# Patient Record
Sex: Male | Born: 1960
Health system: Southern US, Community
[De-identification: ages and names within clinical notes are randomized; demographics above are authoritative.]

## PROBLEM LIST (undated history)

## (undated) DIAGNOSIS — D649 Anemia, unspecified: Secondary | ICD-10-CM

## (undated) DIAGNOSIS — N189 Chronic kidney disease, unspecified: Secondary | ICD-10-CM

## (undated) DIAGNOSIS — I10 Essential (primary) hypertension: Secondary | ICD-10-CM

## (undated) DIAGNOSIS — G4730 Sleep apnea, unspecified: Secondary | ICD-10-CM

## (undated) DIAGNOSIS — M069 Rheumatoid arthritis, unspecified: Secondary | ICD-10-CM

## (undated) DIAGNOSIS — I509 Heart failure, unspecified: Secondary | ICD-10-CM

## (undated) DIAGNOSIS — I5022 Chronic systolic (congestive) heart failure: Secondary | ICD-10-CM

## (undated) DIAGNOSIS — M1990 Unspecified osteoarthritis, unspecified site: Secondary | ICD-10-CM

## (undated) HISTORY — PX: CARDIAC CATHETERIZATION: SHX172

## (undated) HISTORY — PX: KNEE SURGERY: SHX244

## (undated) HISTORY — DX: Chronic systolic (congestive) heart failure: I50.22

---

## 2015-03-12 DIAGNOSIS — I420 Dilated cardiomyopathy: Secondary | ICD-10-CM | POA: Insufficient documentation

## 2015-03-12 DIAGNOSIS — N189 Chronic kidney disease, unspecified: Secondary | ICD-10-CM | POA: Insufficient documentation

## 2015-03-13 DIAGNOSIS — I10 Essential (primary) hypertension: Secondary | ICD-10-CM | POA: Diagnosis not present

## 2015-03-13 DIAGNOSIS — I429 Cardiomyopathy, unspecified: Secondary | ICD-10-CM | POA: Diagnosis not present

## 2015-03-13 DIAGNOSIS — N189 Chronic kidney disease, unspecified: Secondary | ICD-10-CM | POA: Diagnosis not present

## 2015-04-24 DIAGNOSIS — I501 Left ventricular failure: Secondary | ICD-10-CM | POA: Diagnosis not present

## 2015-04-24 DIAGNOSIS — I361 Nonrheumatic tricuspid (valve) insufficiency: Secondary | ICD-10-CM | POA: Diagnosis not present

## 2015-04-24 DIAGNOSIS — I272 Other secondary pulmonary hypertension: Secondary | ICD-10-CM | POA: Diagnosis not present

## 2015-07-16 DIAGNOSIS — Z2821 Immunization not carried out because of patient refusal: Secondary | ICD-10-CM | POA: Insufficient documentation

## 2015-07-16 DIAGNOSIS — Z789 Other specified health status: Secondary | ICD-10-CM | POA: Diagnosis not present

## 2015-07-16 DIAGNOSIS — M67432 Ganglion, left wrist: Secondary | ICD-10-CM | POA: Diagnosis not present

## 2015-07-16 DIAGNOSIS — M130 Polyarthritis, unspecified: Secondary | ICD-10-CM | POA: Diagnosis not present

## 2015-07-16 DIAGNOSIS — M67439 Ganglion, unspecified wrist: Secondary | ICD-10-CM | POA: Insufficient documentation

## 2015-08-10 ENCOUNTER — Emergency Department (HOSPITAL_COMMUNITY): Payer: Medicare Other

## 2015-08-10 ENCOUNTER — Encounter (HOSPITAL_COMMUNITY): Payer: Self-pay | Admitting: *Deleted

## 2015-08-10 ENCOUNTER — Inpatient Hospital Stay (HOSPITAL_COMMUNITY)
Admission: EM | Admit: 2015-08-10 | Discharge: 2015-08-20 | DRG: 682 | Disposition: A | Discharge status: Skilled Nursing Facility | Payer: Medicare Other | Attending: Internal Medicine | Admitting: Internal Medicine

## 2015-08-10 DIAGNOSIS — J9621 Acute and chronic respiratory failure with hypoxia: Secondary | ICD-10-CM | POA: Diagnosis not present

## 2015-08-10 DIAGNOSIS — N185 Chronic kidney disease, stage 5: Secondary | ICD-10-CM | POA: Diagnosis present

## 2015-08-10 DIAGNOSIS — M1990 Unspecified osteoarthritis, unspecified site: Secondary | ICD-10-CM | POA: Diagnosis present

## 2015-08-10 DIAGNOSIS — R5381 Other malaise: Secondary | ICD-10-CM | POA: Diagnosis present

## 2015-08-10 DIAGNOSIS — J9602 Acute respiratory failure with hypercapnia: Secondary | ICD-10-CM

## 2015-08-10 DIAGNOSIS — D509 Iron deficiency anemia, unspecified: Secondary | ICD-10-CM | POA: Diagnosis present

## 2015-08-10 DIAGNOSIS — Z79899 Other long term (current) drug therapy: Secondary | ICD-10-CM

## 2015-08-10 DIAGNOSIS — I5043 Acute on chronic combined systolic (congestive) and diastolic (congestive) heart failure: Secondary | ICD-10-CM | POA: Diagnosis present

## 2015-08-10 DIAGNOSIS — J9601 Acute respiratory failure with hypoxia: Secondary | ICD-10-CM | POA: Diagnosis present

## 2015-08-10 DIAGNOSIS — R404 Transient alteration of awareness: Secondary | ICD-10-CM | POA: Diagnosis not present

## 2015-08-10 DIAGNOSIS — G9341 Metabolic encephalopathy: Secondary | ICD-10-CM | POA: Diagnosis not present

## 2015-08-10 DIAGNOSIS — I428 Other cardiomyopathies: Secondary | ICD-10-CM

## 2015-08-10 DIAGNOSIS — E662 Morbid (severe) obesity with alveolar hypoventilation: Secondary | ICD-10-CM | POA: Diagnosis present

## 2015-08-10 DIAGNOSIS — M545 Low back pain: Secondary | ICD-10-CM | POA: Diagnosis present

## 2015-08-10 DIAGNOSIS — J811 Chronic pulmonary edema: Secondary | ICD-10-CM

## 2015-08-10 DIAGNOSIS — I429 Cardiomyopathy, unspecified: Secondary | ICD-10-CM | POA: Diagnosis present

## 2015-08-10 DIAGNOSIS — Z87891 Personal history of nicotine dependence: Secondary | ICD-10-CM

## 2015-08-10 DIAGNOSIS — I130 Hypertensive heart and chronic kidney disease with heart failure and stage 1 through stage 4 chronic kidney disease, or unspecified chronic kidney disease: Secondary | ICD-10-CM | POA: Diagnosis not present

## 2015-08-10 DIAGNOSIS — M549 Dorsalgia, unspecified: Secondary | ICD-10-CM

## 2015-08-10 DIAGNOSIS — N2581 Secondary hyperparathyroidism of renal origin: Secondary | ICD-10-CM | POA: Diagnosis present

## 2015-08-10 DIAGNOSIS — Z452 Encounter for adjustment and management of vascular access device: Secondary | ICD-10-CM | POA: Insufficient documentation

## 2015-08-10 DIAGNOSIS — M6281 Muscle weakness (generalized): Secondary | ICD-10-CM | POA: Diagnosis not present

## 2015-08-10 DIAGNOSIS — R296 Repeated falls: Secondary | ICD-10-CM | POA: Diagnosis present

## 2015-08-10 DIAGNOSIS — R159 Full incontinence of feces: Secondary | ICD-10-CM | POA: Diagnosis present

## 2015-08-10 DIAGNOSIS — M25461 Effusion, right knee: Secondary | ICD-10-CM | POA: Diagnosis present

## 2015-08-10 DIAGNOSIS — R402362 Coma scale, best motor response, obeys commands, at arrival to emergency department: Secondary | ICD-10-CM | POA: Diagnosis present

## 2015-08-10 DIAGNOSIS — M25561 Pain in right knee: Secondary | ICD-10-CM | POA: Diagnosis not present

## 2015-08-10 DIAGNOSIS — J9622 Acute and chronic respiratory failure with hypercapnia: Secondary | ICD-10-CM | POA: Diagnosis present

## 2015-08-10 DIAGNOSIS — M069 Rheumatoid arthritis, unspecified: Secondary | ICD-10-CM | POA: Diagnosis present

## 2015-08-10 DIAGNOSIS — J189 Pneumonia, unspecified organism: Secondary | ICD-10-CM | POA: Diagnosis present

## 2015-08-10 DIAGNOSIS — N189 Chronic kidney disease, unspecified: Secondary | ICD-10-CM

## 2015-08-10 DIAGNOSIS — M546 Pain in thoracic spine: Secondary | ICD-10-CM | POA: Diagnosis not present

## 2015-08-10 DIAGNOSIS — N179 Acute kidney failure, unspecified: Principal | ICD-10-CM | POA: Diagnosis present

## 2015-08-10 DIAGNOSIS — W19XXXA Unspecified fall, initial encounter: Secondary | ICD-10-CM | POA: Insufficient documentation

## 2015-08-10 DIAGNOSIS — Z9119 Patient's noncompliance with other medical treatment and regimen: Secondary | ICD-10-CM

## 2015-08-10 DIAGNOSIS — H1131 Conjunctival hemorrhage, right eye: Secondary | ICD-10-CM | POA: Diagnosis present

## 2015-08-10 DIAGNOSIS — M25551 Pain in right hip: Secondary | ICD-10-CM | POA: Diagnosis present

## 2015-08-10 DIAGNOSIS — R402252 Coma scale, best verbal response, oriented, at arrival to emergency department: Secondary | ICD-10-CM | POA: Diagnosis present

## 2015-08-10 DIAGNOSIS — E872 Acidosis: Secondary | ICD-10-CM | POA: Diagnosis not present

## 2015-08-10 DIAGNOSIS — I10 Essential (primary) hypertension: Secondary | ICD-10-CM | POA: Diagnosis present

## 2015-08-10 DIAGNOSIS — N39498 Other specified urinary incontinence: Secondary | ICD-10-CM | POA: Diagnosis present

## 2015-08-10 DIAGNOSIS — I83028 Varicose veins of left lower extremity with ulcer other part of lower leg: Secondary | ICD-10-CM | POA: Diagnosis present

## 2015-08-10 DIAGNOSIS — S0011XA Contusion of right eyelid and periocular area, initial encounter: Secondary | ICD-10-CM | POA: Diagnosis present

## 2015-08-10 DIAGNOSIS — R402142 Coma scale, eyes open, spontaneous, at arrival to emergency department: Secondary | ICD-10-CM | POA: Diagnosis present

## 2015-08-10 DIAGNOSIS — I5033 Acute on chronic diastolic (congestive) heart failure: Secondary | ICD-10-CM

## 2015-08-10 DIAGNOSIS — Z6841 Body Mass Index (BMI) 40.0 and over, adult: Secondary | ICD-10-CM

## 2015-08-10 DIAGNOSIS — M179 Osteoarthritis of knee, unspecified: Secondary | ICD-10-CM | POA: Diagnosis present

## 2015-08-10 DIAGNOSIS — E875 Hyperkalemia: Secondary | ICD-10-CM | POA: Diagnosis not present

## 2015-08-10 DIAGNOSIS — L97829 Non-pressure chronic ulcer of other part of left lower leg with unspecified severity: Secondary | ICD-10-CM | POA: Diagnosis present

## 2015-08-10 DIAGNOSIS — W06XXXA Fall from bed, initial encounter: Secondary | ICD-10-CM | POA: Diagnosis present

## 2015-08-10 DIAGNOSIS — L97909 Non-pressure chronic ulcer of unspecified part of unspecified lower leg with unspecified severity: Secondary | ICD-10-CM

## 2015-08-10 DIAGNOSIS — I5032 Chronic diastolic (congestive) heart failure: Secondary | ICD-10-CM | POA: Insufficient documentation

## 2015-08-10 DIAGNOSIS — I83009 Varicose veins of unspecified lower extremity with ulcer of unspecified site: Secondary | ICD-10-CM | POA: Diagnosis present

## 2015-08-10 DIAGNOSIS — Z7401 Bed confinement status: Secondary | ICD-10-CM

## 2015-08-10 DIAGNOSIS — J9600 Acute respiratory failure, unspecified whether with hypoxia or hypercapnia: Secondary | ICD-10-CM | POA: Insufficient documentation

## 2015-08-10 DIAGNOSIS — S3992XA Unspecified injury of lower back, initial encounter: Secondary | ICD-10-CM | POA: Diagnosis not present

## 2015-08-10 DIAGNOSIS — R0902 Hypoxemia: Secondary | ICD-10-CM

## 2015-08-10 DIAGNOSIS — R627 Adult failure to thrive: Secondary | ICD-10-CM | POA: Diagnosis present

## 2015-08-10 DIAGNOSIS — Y92009 Unspecified place in unspecified non-institutional (private) residence as the place of occurrence of the external cause: Secondary | ICD-10-CM

## 2015-08-10 DIAGNOSIS — R531 Weakness: Secondary | ICD-10-CM | POA: Diagnosis not present

## 2015-08-10 DIAGNOSIS — R0600 Dyspnea, unspecified: Secondary | ICD-10-CM

## 2015-08-10 DIAGNOSIS — T380X5A Adverse effect of glucocorticoids and synthetic analogues, initial encounter: Secondary | ICD-10-CM | POA: Diagnosis present

## 2015-08-10 HISTORY — DX: Unspecified osteoarthritis, unspecified site: M19.90

## 2015-08-10 HISTORY — DX: Heart failure, unspecified: I50.9

## 2015-08-10 HISTORY — DX: Essential (primary) hypertension: I10

## 2015-08-10 LAB — COMPREHENSIVE METABOLIC PANEL
ALBUMIN: 2.7 g/dL — AB (ref 3.5–5.0)
ALT: 28 U/L (ref 17–63)
ANION GAP: 11 (ref 5–15)
AST: 49 U/L — ABNORMAL HIGH (ref 15–41)
Alkaline Phosphatase: 67 U/L (ref 38–126)
BUN: 80 mg/dL — ABNORMAL HIGH (ref 6–20)
CHLORIDE: 102 mmol/L (ref 101–111)
CO2: 25 mmol/L (ref 22–32)
Calcium: 9 mg/dL (ref 8.9–10.3)
Creatinine, Ser: 4.62 mg/dL — ABNORMAL HIGH (ref 0.61–1.24)
GFR calc non Af Amer: 13 mL/min — ABNORMAL LOW (ref >60)
GFR, EST AFRICAN AMERICAN: 15 mL/min — AB (ref >60)
Glucose, Bld: 78 mg/dL (ref 65–99)
POTASSIUM: 4.8 mmol/L (ref 3.5–5.1)
SODIUM: 138 mmol/L (ref 135–145)
Total Bilirubin: 0.8 mg/dL (ref 0.3–1.2)
Total Protein: 7.4 g/dL (ref 6.5–8.1)

## 2015-08-10 LAB — URINALYSIS, ROUTINE W REFLEX MICROSCOPIC
Bilirubin Urine: NEGATIVE
Glucose, UA: NEGATIVE mg/dL
KETONES UR: NEGATIVE mg/dL
LEUKOCYTES UA: NEGATIVE
NITRITE: NEGATIVE
PH: 5 (ref 5.0–8.0)
Protein, ur: 30 mg/dL — AB
SPECIFIC GRAVITY, URINE: 1.014 (ref 1.005–1.030)

## 2015-08-10 LAB — CBC WITH DIFFERENTIAL/PLATELET
BASOS PCT: 0 %
Basophils Absolute: 0 10*3/uL (ref 0.0–0.1)
EOS ABS: 0.2 10*3/uL (ref 0.0–0.7)
Eosinophils Relative: 2 %
HEMATOCRIT: 26.3 % — AB (ref 39.0–52.0)
HEMOGLOBIN: 8.6 g/dL — AB (ref 13.0–17.0)
LYMPHS ABS: 1.6 10*3/uL (ref 0.7–4.0)
Lymphocytes Relative: 13 %
MCH: 26.1 pg (ref 26.0–34.0)
MCHC: 32.7 g/dL (ref 30.0–36.0)
MCV: 79.9 fL (ref 78.0–100.0)
MONOS PCT: 9 %
Monocytes Absolute: 1.1 10*3/uL — ABNORMAL HIGH (ref 0.1–1.0)
NEUTROS ABS: 9.4 10*3/uL — AB (ref 1.7–7.7)
NEUTROS PCT: 76 %
PLATELETS: 267 10*3/uL (ref 150–400)
RBC: 3.29 MIL/uL — AB (ref 4.22–5.81)
RDW: 16.6 % — ABNORMAL HIGH (ref 11.5–15.5)
WBC: 12.3 10*3/uL — AB (ref 4.0–10.5)

## 2015-08-10 LAB — URINE MICROSCOPIC-ADD ON

## 2015-08-10 MED ORDER — HYDROMORPHONE HCL 1 MG/ML IJ SOLN
1.0000 mg | INTRAMUSCULAR | Status: DC | PRN
  Administered 2015-08-10: 1 mg via INTRAVENOUS
  Filled 2015-08-10 (×2): qty 1

## 2015-08-10 MED ORDER — MORPHINE SULFATE (PF) 4 MG/ML IV SOLN
6.0000 mg | Freq: Once | INTRAVENOUS | Status: AC
  Administered 2015-08-10: 6 mg via INTRAVENOUS
  Filled 2015-08-10: qty 2

## 2015-08-10 NOTE — ED Notes (Signed)
Patient returned from MRI.

## 2015-08-10 NOTE — ED Provider Notes (Signed)
CSN: [id number]     Arrival date & time 08/10/15  1607 History   First MD Initiated Contact with Patient 08/10/15 1734     Chief Complaint  Patient presents with  . Fall     (Consider location/radiation/quality/duration/timing/severity/associated sxs/prior Treatment) HPI  Patient is a 54 year old male, with a past medical history of CHF, hypertension, arthritis who presents to the emergency department following recurrent falls. Patient recently diagnosed with rheumatoid arthritis, however patient has not been evaluated by rheumatology and has not initiated any treatment. Patient reports that he has had progressive worsening of lower back pain and lower joint pain over the past 1-2 months. Patient states that he is primarily bedbound over the course of the month. Reports difficulty with ambulation secondary to diffuse joint pain of his upper and lower extremities. States it has been progressively worsening over the past week and a half. Reports some urinary and bowel incontinence due to inability to get to the bathroom on time. Nothing seems to worsen or improve the pain. Has not taken anything for the pain. Patient had 2 falls today, reports that his knees "gave out from underneath him". States he hit the right side of his face on the hard floor. Complaining of swelling to the right eye. Denies headache, change in vision, pain with extraocular movement, difficulty swallowing, chest pain, shortness of breath, fevers, chills, urinary retention. Does report worsening weakness of the right lower extremity, states he can no longer lifted out of bed. Denies known malignancy, denies night sweats, no unexpected weight loss.  Past Medical History  Diagnosis Date  . CHF (congestive heart failure) ([place])   . Hypertension   . Arthritis    History reviewed. No pertinent past surgical history. History reviewed. No pertinent family history. Social History  Substance Use Topics  . Smoking status: Former  Research scientist (life sciences)  . Smokeless tobacco: None  . Alcohol Use: No    Review of Systems  Constitutional: Negative for fever and appetite change.  HENT: Negative for congestion and trouble swallowing.   Eyes: Negative for visual disturbance.  Respiratory: Negative for chest tightness, shortness of breath and wheezing.   Cardiovascular: Negative for chest pain and palpitations.  Gastrointestinal: Negative for nausea, vomiting, abdominal pain, constipation and blood in stool.  Genitourinary: Positive for frequency. Negative for dysuria, urgency, hematuria, flank pain, decreased urine volume and difficulty urinating.  Musculoskeletal: Positive for back pain, joint swelling, arthralgias and gait problem. Negative for neck pain and neck stiffness.  Skin: Positive for wound. Negative for rash.  Neurological: Negative for dizziness, seizures, weakness, light-headedness, numbness and headaches.  Psychiatric/Behavioral: Negative for behavioral problems.      Allergies  Review of patient's allergies indicates no known allergies.  Home Medications   Prior to Admission medications   Medication Sig Start Date End Date Taking? Authorizing Provider  carvedilol (COREG) 25 MG tablet Take 25 mg by mouth 2 (two) times daily with a meal.   Yes Historical Provider, MD  furosemide (LASIX) 40 MG tablet Take 60 mg by mouth 2 (two) times daily.   Yes Historical Provider, MD   BP 146/71 mmHg  Pulse 108  Temp(Src) 98.7 F (37.1 C) (Oral)  Resp 18  Ht 5\' 11"  (1.803 m)  Wt 184.478 kg  BMI 56.75 kg/m2  SpO2 94% Physical Exam  Constitutional: He is oriented to person, place, and time. He appears well-developed and well-nourished. No distress.  HENT:  Head: Normocephalic and atraumatic.  Mouth/Throat: Oropharynx is clear and moist.  Eyes:  EOM are normal. Pupils are equal, round, and reactive to light. Right conjunctiva has a hemorrhage. Left conjunctiva has no hemorrhage.  Neck: Normal range of motion. No JVD  present. No tracheal deviation present.  Cardiovascular: Normal rate, regular rhythm, normal heart sounds and intact distal pulses.   Pulmonary/Chest: Effort normal and breath sounds normal. No respiratory distress. He has no wheezes.  Abdominal: Soft. He exhibits no distension. There is no tenderness. There is no rebound and no guarding.  Genitourinary:  Refused GU exam.  Musculoskeletal: Normal range of motion.  Bilateral lower extremity edema.  5/5 strength throughout LLE. 4/5 strength of right hip, knee, ankle flexor and extensors.    Neurological: He is alert and oriented to person, place, and time. He has normal reflexes. No cranial nerve deficit or sensory deficit. Gait abnormal. GCS eye subscore is 4. GCS verbal subscore is 5. GCS motor subscore is 6. He displays no Babinski's sign on the right side. He displays no Babinski's sign on the left side.  Skin: Skin is warm.  Psychiatric: He has a normal mood and affect.  Nursing note and vitals reviewed.   ED Course  Procedures (including critical care time) Labs Review Labs Reviewed  CBC WITH DIFFERENTIAL/PLATELET - Abnormal; Notable for the following:    WBC 12.3 (*)    RBC 3.29 (*)    Hemoglobin 8.6 (*)    HCT 26.3 (*)    RDW 16.6 (*)    Neutro Abs 9.4 (*)    Monocytes Absolute 1.1 (*)    All other components within normal limits  COMPREHENSIVE METABOLIC PANEL - Abnormal; Notable for the following:    BUN 80 (*)    Creatinine, Ser 4.62 (*)    Albumin 2.7 (*)    AST 49 (*)    GFR calc non Af Amer 13 (*)    GFR calc Af Amer 15 (*)    All other components within normal limits  URINALYSIS, ROUTINE W REFLEX MICROSCOPIC (NOT AT [facility]) - Abnormal; Notable for the following:    APPearance CLOUDY (*)    Hgb urine dipstick MODERATE (*)    Protein, ur 30 (*)    All other components within normal limits  URINE MICROSCOPIC-ADD ON - Abnormal; Notable for the following:    Squamous Epithelial / LPF 0-5 (*)    Bacteria, UA RARE (*)     All other components within normal limits  BASIC METABOLIC PANEL  CBC    Imaging Review Dg Thoracic Spine 2 View  08/10/2015  CLINICAL DATA:  Back pain after a fall today. EXAM: THORACIC SPINE 2 VIEWS COMPARISON:  None. FINDINGS: Normal alignment of the thoracic spine. Diffuse degenerative changes with bridging anterior osteophytes throughout. No vertebral compression deformities. Bone cortex appears intact. No focal bone lesion or bone destruction. No paraspinal soft tissue swelling. IMPRESSION: Diffuse degenerative change throughout the thoracic spine with bridging anterior osteophytes. No acute displaced fractures identified. Electronically Signed   By: Lucienne Capers M.D.   On: 08/10/2015 19:09   Dg Lumbar Spine 2-3 Views  08/10/2015  CLINICAL DATA:  Fall, back pain EXAM: LUMBAR SPINE - 2-3 VIEW COMPARISON:  None. FINDINGS: Two views of lumbar spine submitted. No acute fracture or subluxation. Multiple level anterior endplate osteophytes. Bridging anterior osteophytes at L1-L2 and L2-L3 level. Right lateral osteophytes noted at L2-L3 level. Disc space flattening at L4-L5 and L5-S1 level. Facet degenerative changes L4 and L5 level. IMPRESSION: No acute fracture or subluxation. Multilevel degenerative changes as described  above. Electronically Signed   By: Lahoma Crocker M.D.   On: 08/10/2015 19:11   Mr Thoracic Spine Wo Contrast  08/10/2015  CLINICAL DATA:  Initial evaluation for acute low back pain with right hip pain. Incontinence. Status post fall. EXAM: MRI THORACIC SPINE WITHOUT CONTRAST TECHNIQUE: Multiplanar, multisequence MR imaging of the thoracic spine was performed. No intravenous contrast was administered. COMPARISON:  Prior radiograph from earlier the same day. FINDINGS: Vertebral bodies are normally aligned with preservation of the normal thoracic kyphosis. There is mild chronic height loss at the superior endplate of 624THL, chronic in nature. No associated retropulsion. Small Schmorl's  nodes at the superior endplates of T2 and 624THL. Minimal edema about the Schmorl's node at T12. Otherwise, vertebral body heights are well maintained. No fracture. Mild diffuse decreased it T1 signal intensity throughout the vertebral body bone marrow noted, likely within normal limits. No focal osseous lesions. No marrow edema. Signal intensity within the thoracic spinal cord is within normal limits. No paraspinous soft tissue abnormality. Diffusely flowing osteophytes seen throughout the mid and lower thoracic spine, suggesting DISH. No significant disc bulge within the thoracic spine. No significant canal or foraminal stenosis. IMPRESSION: 1. No acute abnormality within the thoracic spine. No cord compression or significant stenosis. 2. Mild chronic height loss at the superior endplate of 624THL. 3. Diffusely flowing bridging osteophytes throughout the mid and lower thoracic spine, suggesting DISH. Electronically Signed   By: Jeannine Boga M.D.   On: 08/10/2015 22:54   Mr Lumbar Spine Wo Contrast  08/10/2015  CLINICAL DATA:  Initial evaluation for acute low back pain with pain and right hip status post fall, weakness. EXAM: MRI LUMBAR SPINE WITHOUT CONTRAST TECHNIQUE: Multiplanar, multisequence MR imaging of the lumbar spine was performed. No intravenous contrast was administered. COMPARISON:  Prior radiograph from earlier the same day. FINDINGS: Study is limited by body habitus. The purposes of this dictation, the lowest well-formed intervertebral disc spaces presumed to be the L5-S1 level, and there presumed to be 5 lumbar type vertebral bodies. Vertebral bodies are normally aligned with preservation of the normal lumbar lordosis. Vertebral body heights are maintained. No fracture. Somewhat diffusely decreased T1 signal intensity within the visualized vertebral body bone marrow, but still likely within normal limits. Conus medullaris terminates normally at the T12-L1 level. Signal intensity within the  visualized cord is normal. Nerve roots of the cauda equina are normal in appearance. Paraspinous soft tissues demonstrate no acute abnormality. Diffuse congenital shortening of the pedicles present. No significant degenerative pathology present at T11-12 and T12-L1. L1-2: Degenerative disc desiccation with minimal disc bulge. Prominent osteophytic spurring along the right anterolateral aspect of the L1-2 disc space. No focal disc herniation. No significant canal stenosis. Mild edema about the right L1-2 facet likely related to degenerative changes. Very mild right foraminal. No neural impingement. L2-3:  Negative. L3-4: No disc bulge or disc protrusion. Intervertebral disc is well hydrated. Bilateral facet arthrosis, right greater than left. No significant canal or foraminal stenosis. No neural impingement. L4-5: Diffuse disc bulge with disc desiccation. There is a small shallow superimposed central disc protrusion which indents the ventral thecal sac. Mild bilateral facet arthrosis. These changes superimposed on short pedicles results in moderate canal stenosis. Mild left foraminal narrowing present. L5-S1: No disc bulge or disc protrusion. Very mild bilateral facet hypertrophy. No significant canal or foraminal stenosis. IMPRESSION: 1. No acute abnormality within the lumbar spine. 2. Shallow central disc protrusion at L4-5, which superimposed on mild facet arthrosis and  short pedicles results in moderate canal stenosis. No other significant canal narrowing within the lumbar spine. No cord compression. 3. Severe right-sided facet arthrosis at L1-2 with associated reactive edema. 4. Mild degenerative disc bulge at L1-2 without stenosis. 5. Diffuse congenital shortening of the pedicles. Electronically Signed   By: Jeannine Boga M.D.   On: 08/10/2015 22:15   I have personally reviewed and evaluated these images and lab results as part of my medical decision-making.   EKG Interpretation None      MDM    Final diagnoses:  Acute back pain  Acute back pain   Patient is a 54 year old male with past medical history significant for CHF, hypertension, recently diagnosed rheumatoid arthritis (has not seen rheumatology or initiated treatment), who presents to the emergency department with lower back pain associated with falls. On arrival no acute distress, not ill appearing. Afebrile, hemodynamically stable. Exam as above, notable for obesity, diffuse joint swelling of his lower extremities, decreased strength of the right lower extremity when compared to the left, +2 DTR, sensation intact. GCS of 15.  Given IV pain medication.  Patient only traumatic finding is swelling around his right eye with subconjunctival hemorrhage. No eye pain, no proptosis, extraocular movements intact, no change in vision. No signs of retrobulbar hematoma. No signs of entrapment. GCS 15. Normal neurologic exam. Do not feel that CT head, face, C-spine is necessary at this time. No other traumatic findings.  Given the questionable urinary in bowel incontinence with appreciable weakness of the right lower extremity when compared to the left will obtain MRI lumbar and thoracic spine to evaluate for cord compression syndromes. Will obtain basic lab work.  MRI showed no signs of cord compression. Lab work showed AKI, Cr 4.68. No history of renal disease.  No acute electrolyte abnormalities, does not require emergent dialysis.  Given that the patient is unable to ambulate in the ED with new AKI of unknown etiology, will admit the patient to medicine for ongoing management and workup. Patient stable for the floor. Transferred to the floor in stable condition.      Nathaniel Man, MD 08/11/15 [place], MD 08/11/15 [phone]

## 2015-08-10 NOTE — ED Notes (Signed)
Pt tripped while getting out of bed and fell face first on floor injuring right eye.  Pt fell a second time when trying to stand because his legs were weak and not able to hold his weight.  Pt c/o pain in his lower back and right hip.  Pt is incontinent and has extreme edema bilateral lower legs with sores.  Denies hitting head or LOC.  Pt c/o of SOB due to congestion.  Denies chest pain or abdominal pain.

## 2015-08-11 ENCOUNTER — Encounter (HOSPITAL_COMMUNITY): Payer: Self-pay | Admitting: *Deleted

## 2015-08-11 ENCOUNTER — Inpatient Hospital Stay (HOSPITAL_COMMUNITY): Payer: Medicare Other

## 2015-08-11 DIAGNOSIS — L97909 Non-pressure chronic ulcer of unspecified part of unspecified lower leg with unspecified severity: Secondary | ICD-10-CM

## 2015-08-11 DIAGNOSIS — M6281 Muscle weakness (generalized): Secondary | ICD-10-CM | POA: Diagnosis not present

## 2015-08-11 DIAGNOSIS — R0902 Hypoxemia: Secondary | ICD-10-CM | POA: Diagnosis not present

## 2015-08-11 DIAGNOSIS — J9622 Acute and chronic respiratory failure with hypercapnia: Secondary | ICD-10-CM | POA: Diagnosis present

## 2015-08-11 DIAGNOSIS — E872 Acidosis: Secondary | ICD-10-CM | POA: Diagnosis not present

## 2015-08-11 DIAGNOSIS — W06XXXA Fall from bed, initial encounter: Secondary | ICD-10-CM | POA: Diagnosis present

## 2015-08-11 DIAGNOSIS — Z7401 Bed confinement status: Secondary | ICD-10-CM | POA: Diagnosis not present

## 2015-08-11 DIAGNOSIS — M25561 Pain in right knee: Secondary | ICD-10-CM | POA: Diagnosis not present

## 2015-08-11 DIAGNOSIS — M1990 Unspecified osteoarthritis, unspecified site: Secondary | ICD-10-CM | POA: Diagnosis present

## 2015-08-11 DIAGNOSIS — E662 Morbid (severe) obesity with alveolar hypoventilation: Secondary | ICD-10-CM | POA: Diagnosis not present

## 2015-08-11 DIAGNOSIS — J9600 Acute respiratory failure, unspecified whether with hypoxia or hypercapnia: Secondary | ICD-10-CM | POA: Diagnosis not present

## 2015-08-11 DIAGNOSIS — Z79899 Other long term (current) drug therapy: Secondary | ICD-10-CM | POA: Diagnosis not present

## 2015-08-11 DIAGNOSIS — R402142 Coma scale, eyes open, spontaneous, at arrival to emergency department: Secondary | ICD-10-CM | POA: Diagnosis present

## 2015-08-11 DIAGNOSIS — J9621 Acute and chronic respiratory failure with hypoxia: Secondary | ICD-10-CM | POA: Diagnosis present

## 2015-08-11 DIAGNOSIS — I5043 Acute on chronic combined systolic (congestive) and diastolic (congestive) heart failure: Secondary | ICD-10-CM | POA: Diagnosis present

## 2015-08-11 DIAGNOSIS — I429 Cardiomyopathy, unspecified: Secondary | ICD-10-CM | POA: Diagnosis not present

## 2015-08-11 DIAGNOSIS — I509 Heart failure, unspecified: Secondary | ICD-10-CM | POA: Diagnosis not present

## 2015-08-11 DIAGNOSIS — I83019 Varicose veins of right lower extremity with ulcer of unspecified site: Secondary | ICD-10-CM | POA: Diagnosis not present

## 2015-08-11 DIAGNOSIS — E875 Hyperkalemia: Secondary | ICD-10-CM | POA: Diagnosis not present

## 2015-08-11 DIAGNOSIS — N184 Chronic kidney disease, stage 4 (severe): Secondary | ICD-10-CM | POA: Diagnosis not present

## 2015-08-11 DIAGNOSIS — Z9181 History of falling: Secondary | ICD-10-CM | POA: Diagnosis not present

## 2015-08-11 DIAGNOSIS — R627 Adult failure to thrive: Secondary | ICD-10-CM | POA: Diagnosis present

## 2015-08-11 DIAGNOSIS — R41841 Cognitive communication deficit: Secondary | ICD-10-CM | POA: Diagnosis not present

## 2015-08-11 DIAGNOSIS — M0579 Rheumatoid arthritis with rheumatoid factor of multiple sites without organ or systems involvement: Secondary | ICD-10-CM | POA: Diagnosis not present

## 2015-08-11 DIAGNOSIS — D509 Iron deficiency anemia, unspecified: Secondary | ICD-10-CM | POA: Diagnosis present

## 2015-08-11 DIAGNOSIS — S0011XA Contusion of right eyelid and periocular area, initial encounter: Secondary | ICD-10-CM | POA: Diagnosis present

## 2015-08-11 DIAGNOSIS — I5033 Acute on chronic diastolic (congestive) heart failure: Secondary | ICD-10-CM | POA: Diagnosis not present

## 2015-08-11 DIAGNOSIS — L97829 Non-pressure chronic ulcer of other part of left lower leg with unspecified severity: Secondary | ICD-10-CM | POA: Diagnosis present

## 2015-08-11 DIAGNOSIS — R402252 Coma scale, best verbal response, oriented, at arrival to emergency department: Secondary | ICD-10-CM | POA: Diagnosis present

## 2015-08-11 DIAGNOSIS — M549 Dorsalgia, unspecified: Secondary | ICD-10-CM | POA: Diagnosis not present

## 2015-08-11 DIAGNOSIS — R2681 Unsteadiness on feet: Secondary | ICD-10-CM | POA: Diagnosis not present

## 2015-08-11 DIAGNOSIS — I120 Hypertensive chronic kidney disease with stage 5 chronic kidney disease or end stage renal disease: Secondary | ICD-10-CM | POA: Diagnosis not present

## 2015-08-11 DIAGNOSIS — S0510XA Contusion of eyeball and orbital tissues, unspecified eye, initial encounter: Secondary | ICD-10-CM | POA: Insufficient documentation

## 2015-08-11 DIAGNOSIS — E8770 Fluid overload, unspecified: Secondary | ICD-10-CM | POA: Diagnosis not present

## 2015-08-11 DIAGNOSIS — R0602 Shortness of breath: Secondary | ICD-10-CM | POA: Diagnosis not present

## 2015-08-11 DIAGNOSIS — R0600 Dyspnea, unspecified: Secondary | ICD-10-CM | POA: Diagnosis not present

## 2015-08-11 DIAGNOSIS — I83009 Varicose veins of unspecified lower extremity with ulcer of unspecified site: Secondary | ICD-10-CM | POA: Diagnosis present

## 2015-08-11 DIAGNOSIS — R159 Full incontinence of feces: Secondary | ICD-10-CM | POA: Diagnosis present

## 2015-08-11 DIAGNOSIS — R29898 Other symptoms and signs involving the musculoskeletal system: Secondary | ICD-10-CM | POA: Diagnosis not present

## 2015-08-11 DIAGNOSIS — Z87891 Personal history of nicotine dependence: Secondary | ICD-10-CM | POA: Diagnosis not present

## 2015-08-11 DIAGNOSIS — I10 Essential (primary) hypertension: Secondary | ICD-10-CM | POA: Diagnosis not present

## 2015-08-11 DIAGNOSIS — R4182 Altered mental status, unspecified: Secondary | ICD-10-CM | POA: Diagnosis not present

## 2015-08-11 DIAGNOSIS — H1131 Conjunctival hemorrhage, right eye: Secondary | ICD-10-CM | POA: Diagnosis present

## 2015-08-11 DIAGNOSIS — R5381 Other malaise: Secondary | ICD-10-CM | POA: Diagnosis not present

## 2015-08-11 DIAGNOSIS — R2689 Other abnormalities of gait and mobility: Secondary | ICD-10-CM | POA: Diagnosis not present

## 2015-08-11 DIAGNOSIS — N179 Acute kidney failure, unspecified: Secondary | ICD-10-CM | POA: Diagnosis not present

## 2015-08-11 DIAGNOSIS — M79671 Pain in right foot: Secondary | ICD-10-CM | POA: Diagnosis not present

## 2015-08-11 DIAGNOSIS — J9601 Acute respiratory failure with hypoxia: Secondary | ICD-10-CM | POA: Diagnosis not present

## 2015-08-11 DIAGNOSIS — N2581 Secondary hyperparathyroidism of renal origin: Secondary | ICD-10-CM | POA: Diagnosis present

## 2015-08-11 DIAGNOSIS — R296 Repeated falls: Secondary | ICD-10-CM | POA: Diagnosis present

## 2015-08-11 DIAGNOSIS — Z452 Encounter for adjustment and management of vascular access device: Secondary | ICD-10-CM | POA: Diagnosis not present

## 2015-08-11 DIAGNOSIS — M179 Osteoarthritis of knee, unspecified: Secondary | ICD-10-CM | POA: Diagnosis present

## 2015-08-11 DIAGNOSIS — M069 Rheumatoid arthritis, unspecified: Secondary | ICD-10-CM | POA: Diagnosis present

## 2015-08-11 DIAGNOSIS — R402362 Coma scale, best motor response, obeys commands, at arrival to emergency department: Secondary | ICD-10-CM | POA: Diagnosis present

## 2015-08-11 DIAGNOSIS — N185 Chronic kidney disease, stage 5: Secondary | ICD-10-CM | POA: Diagnosis not present

## 2015-08-11 DIAGNOSIS — I83028 Varicose veins of left lower extremity with ulcer other part of lower leg: Secondary | ICD-10-CM | POA: Diagnosis present

## 2015-08-11 DIAGNOSIS — M7989 Other specified soft tissue disorders: Secondary | ICD-10-CM | POA: Diagnosis not present

## 2015-08-11 DIAGNOSIS — S0511XA Contusion of eyeball and orbital tissues, right eye, initial encounter: Secondary | ICD-10-CM

## 2015-08-11 DIAGNOSIS — G9341 Metabolic encephalopathy: Secondary | ICD-10-CM | POA: Diagnosis not present

## 2015-08-11 DIAGNOSIS — M545 Low back pain: Secondary | ICD-10-CM | POA: Diagnosis present

## 2015-08-11 DIAGNOSIS — Z9119 Patient's noncompliance with other medical treatment and regimen: Secondary | ICD-10-CM | POA: Diagnosis not present

## 2015-08-11 DIAGNOSIS — Z6841 Body Mass Index (BMI) 40.0 and over, adult: Secondary | ICD-10-CM | POA: Diagnosis not present

## 2015-08-11 DIAGNOSIS — W19XXXA Unspecified fall, initial encounter: Secondary | ICD-10-CM | POA: Insufficient documentation

## 2015-08-11 DIAGNOSIS — N39498 Other specified urinary incontinence: Secondary | ICD-10-CM | POA: Diagnosis present

## 2015-08-11 DIAGNOSIS — J189 Pneumonia, unspecified organism: Secondary | ICD-10-CM | POA: Diagnosis present

## 2015-08-11 DIAGNOSIS — M25551 Pain in right hip: Secondary | ICD-10-CM | POA: Diagnosis present

## 2015-08-11 DIAGNOSIS — M25461 Effusion, right knee: Secondary | ICD-10-CM | POA: Diagnosis not present

## 2015-08-11 DIAGNOSIS — D631 Anemia in chronic kidney disease: Secondary | ICD-10-CM | POA: Diagnosis not present

## 2015-08-11 DIAGNOSIS — R0989 Other specified symptoms and signs involving the circulatory and respiratory systems: Secondary | ICD-10-CM | POA: Diagnosis not present

## 2015-08-11 DIAGNOSIS — T380X5A Adverse effect of glucocorticoids and synthetic analogues, initial encounter: Secondary | ICD-10-CM | POA: Diagnosis present

## 2015-08-11 DIAGNOSIS — I130 Hypertensive heart and chronic kidney disease with heart failure and stage 1 through stage 4 chronic kidney disease, or unspecified chronic kidney disease: Secondary | ICD-10-CM | POA: Diagnosis present

## 2015-08-11 DIAGNOSIS — N189 Chronic kidney disease, unspecified: Secondary | ICD-10-CM | POA: Diagnosis not present

## 2015-08-11 DIAGNOSIS — J9602 Acute respiratory failure with hypercapnia: Secondary | ICD-10-CM | POA: Diagnosis not present

## 2015-08-11 DIAGNOSIS — I5023 Acute on chronic systolic (congestive) heart failure: Secondary | ICD-10-CM | POA: Diagnosis not present

## 2015-08-11 DIAGNOSIS — D638 Anemia in other chronic diseases classified elsewhere: Secondary | ICD-10-CM | POA: Diagnosis not present

## 2015-08-11 LAB — BASIC METABOLIC PANEL
ANION GAP: 9 (ref 5–15)
BUN: 76 mg/dL — AB (ref 6–20)
CALCIUM: 8.9 mg/dL (ref 8.9–10.3)
CO2: 25 mmol/L (ref 22–32)
Chloride: 105 mmol/L (ref 101–111)
Creatinine, Ser: 4.51 mg/dL — ABNORMAL HIGH (ref 0.61–1.24)
GFR calc Af Amer: 16 mL/min — ABNORMAL LOW (ref >60)
GFR, EST NON AFRICAN AMERICAN: 14 mL/min — AB (ref >60)
GLUCOSE: 99 mg/dL (ref 65–99)
POTASSIUM: 4.2 mmol/L (ref 3.5–5.1)
SODIUM: 139 mmol/L (ref 135–145)

## 2015-08-11 LAB — CBC
HCT: 23.8 % — ABNORMAL LOW (ref 39.0–52.0)
Hemoglobin: 7.7 g/dL — ABNORMAL LOW (ref 13.0–17.0)
MCH: 25.9 pg — ABNORMAL LOW (ref 26.0–34.0)
MCHC: 32.4 g/dL (ref 30.0–36.0)
MCV: 80.1 fL (ref 78.0–100.0)
Platelets: 257 10*3/uL (ref 150–400)
RBC: 2.97 MIL/uL — ABNORMAL LOW (ref 4.22–5.81)
RDW: 16.8 % — AB (ref 11.5–15.5)
WBC: 11.8 10*3/uL — AB (ref 4.0–10.5)

## 2015-08-11 MED ORDER — OXYCODONE HCL 5 MG PO TABS
5.0000 mg | ORAL_TABLET | ORAL | Status: DC | PRN
  Administered 2015-08-12: 5 mg via ORAL
  Filled 2015-08-11: qty 1

## 2015-08-11 MED ORDER — SODIUM CHLORIDE 0.9 % IJ SOLN
3.0000 mL | Freq: Two times a day (BID) | INTRAMUSCULAR | Status: DC
  Administered 2015-08-11 – 2015-08-20 (×18): 3 mL via INTRAVENOUS

## 2015-08-11 MED ORDER — SODIUM CHLORIDE 0.9 % IV SOLN
INTRAVENOUS | Status: DC
  Administered 2015-08-11: 04:00:00 via INTRAVENOUS

## 2015-08-11 MED ORDER — FUROSEMIDE 10 MG/ML IJ SOLN
80.0000 mg | Freq: Two times a day (BID) | INTRAMUSCULAR | Status: DC
  Administered 2015-08-11 – 2015-08-13 (×5): 80 mg via INTRAVENOUS
  Filled 2015-08-11 (×5): qty 8

## 2015-08-11 MED ORDER — ACETAMINOPHEN 325 MG PO TABS: 650.0000 mg | ORAL_TABLET | Freq: Four times a day (QID) | ORAL | Status: DC | PRN

## 2015-08-11 MED ORDER — ONDANSETRON HCL 4 MG/2ML IJ SOLN: 4.0000 mg | Freq: Four times a day (QID) | INTRAMUSCULAR | Status: DC | PRN

## 2015-08-11 MED ORDER — HYDROMORPHONE HCL 1 MG/ML IJ SOLN
0.5000 mg | INTRAMUSCULAR | Status: DC | PRN
  Administered 2015-08-12: 0.5 mg via INTRAVENOUS
  Filled 2015-08-11: qty 1

## 2015-08-11 MED ORDER — CARVEDILOL 25 MG PO TABS
25.0000 mg | ORAL_TABLET | Freq: Two times a day (BID) | ORAL | Status: DC
  Administered 2015-08-11 (×2): 25 mg via ORAL
  Filled 2015-08-11 (×2): qty 1

## 2015-08-11 MED ORDER — ALUM & MAG HYDROXIDE-SIMETH 200-200-20 MG/5ML PO SUSP: 30.0000 mL | Freq: Four times a day (QID) | ORAL | Status: DC | PRN

## 2015-08-11 MED ORDER — HYDROCERIN EX CREA
TOPICAL_CREAM | Freq: Every day | CUTANEOUS | Status: DC
Start: 2015-08-11 — End: 2015-08-20
  Administered 2015-08-11: 1 via TOPICAL
  Administered 2015-08-12 – 2015-08-18 (×7): via TOPICAL
  Administered 2015-08-19 – 2015-08-20 (×2): 1 via TOPICAL
  Filled 2015-08-11: qty 113

## 2015-08-11 MED ORDER — HEPARIN SODIUM (PORCINE) 5000 UNIT/ML IJ SOLN
5000.0000 [IU] | Freq: Three times a day (TID) | INTRAMUSCULAR | Status: DC
  Administered 2015-08-11 – 2015-08-20 (×21): 5000 [IU] via SUBCUTANEOUS
  Filled 2015-08-11 (×23): qty 1

## 2015-08-11 MED ORDER — ACETAMINOPHEN 650 MG RE SUPP: 650.0000 mg | Freq: Four times a day (QID) | RECTAL | Status: DC | PRN

## 2015-08-11 MED ORDER — ONDANSETRON HCL 4 MG PO TABS: 4.0000 mg | ORAL_TABLET | Freq: Four times a day (QID) | ORAL | Status: DC | PRN

## 2015-08-11 MED ORDER — CARVEDILOL 25 MG PO TABS
25.0000 mg | ORAL_TABLET | Freq: Two times a day (BID) | ORAL | Status: DC
  Administered 2015-08-12 – 2015-08-13 (×4): 25 mg via ORAL
  Filled 2015-08-11 (×4): qty 1

## 2015-08-11 NOTE — ED Provider Notes (Signed)
I saw and evaluated the patient, reviewed the resident's note and I agree with the findings and plan.    54 year old male who presents with recurrent falls today. Has a history of CHF and hypertension. Recently diagnosed with rheumatoid arthritis, but has not followed up with rheumatology or has initiated any treatment. Reports having progressive decline over the course of the past 1-2 months, where he has been primarily bed bound. States that he has been incontinent of stool and urine due to inability to ambulate to the restroom in time. Has had increasing weakness in his lower extremities right greater than the left. Has also been having worsening pain traveling between all of his joints in his lower and upper extremities. No fevers or chills, urinary retention, nausea or vomiting, chest pain or difficulty breathing. Does state that his legs gave out from underneath him 2 times today, and he suffered mechanical fall. Did fall on his face onto the carpet during one of these falls and noted some swelling to the right eye. Denies any eye pain, decreased feeds to acuity, headache, nausea or vomiting, or new numbness and weakness since his fall. Does note some increasing back pain over the course of the past week as well. Appears very debilitated on presentation, is morbidly obese. Vital signs are overall non-concerning. Very difficult to get a physical exam on him, but he does have some appreciable weakness with right hip  extensors/flexors,, ankle flexors/extensors, and knee extensors last flexors. Remainder of neuro exam is grossly intact. Unclear if incontinence due to retention, but more likely 2/2 to inability to get to restroom in time due to debility. Did perform MRI given weakness and ? Incontinence. Neg for cord compression. Periorbital ecchymosis of right eye, but no entrapment adn no signifcant pain. Subconjunctival hemorrhage noted, but no pain EOMI and no vision changes. No additional head or face  imaging felt necessary. Blood work c/f [facility] without any acute indications for dialysis. Has anemia, without history of GI bleed. Otherwise, no major electrolyte or metabolic derangements. No infectious concerns. Unable to ambulate or transfer in ED and felt unsafe for discharge home to self care. Will admit for ongoing management.  Forde Dandy, MD 08/11/15 [phone]

## 2015-08-11 NOTE — H&P (Signed)
[facility] Admission History and Physical       Noah Garner [id number] DOB: 15-Jan-1961 DOA: 08/10/2015  Referring physician: EDP PCP: Nilda Simmer, MD  Specialists:   Chief Complaint: Weakness  HPI: Noah Garner is a 54 y.o. male with a history of CHF, HTN, and Arthritis who presents to the ED with complaints of increased weakness and lower back pain and 2 falls during the day and fell after his his legs gave way.  He reports that he fell getting into out of bed and hit his face injuring his right eye.   He had X-rays of his T-Spin and L-Spine which were negative for acute findings.  An MRI of the T and L-Spine were also performed and negative for acute findings.    His labs revealed an elevated  BUN/Cr of 80/4.62.  Patient reports having an outpatient workup for autoimmune disease and being referred to an Rheumatologist, and to a  Kidney Specialist but he is not aware of having Kidney problems.     Of Note: Patient has a Left lower Leg Ulcer which he reports has been there for 3-4 weeks.      Review of Systems:  Constitutional: No Weight Loss, No Weight Gain, Night Sweats, Fevers, Chills, Dizziness, Light Headedness, Fatigue, +Generalized Weakness HEENT: No Headaches, Difficulty Swallowing,Tooth/Dental Problems,Sore Throat,  No Sneezing, Rhinitis, Ear Ache, Nasal Congestion, or Post Nasal Drip,  Cardio-vascular:  No Chest pain, Orthopnea, PND, +Edema in Lower Extremities, Anasarca, Dizziness, Palpitations  Resp: No Dyspnea, No DOE, No Productive Cough, No Non-Productive Cough, No Hemoptysis, No Wheezing.    GI: No Heartburn, Indigestion, Abdominal Pain, Nausea, Vomiting, Diarrhea, Constipation, Hematemesis, Hematochezia, Melena, Change in Bowel Habits,  Loss of Appetite  GU: No Dysuria, No Change in Color of Urine, No Urgency or Urinary Frequency, No Flank pain.  Musculoskeletal:  +Joint Pain,   +Swelling, No Decreased Range of Motion, No Back Pain.  Neurologic: No Syncope,  No Seizures, Muscle Weakness, Paresthesia, Vision Disturbance or Loss, No Diplopia, No Vertigo, No Difficulty Walking,  Skin: No Rash or Lesions. Psych: No Change in Mood or Affect, No Depression or Anxiety, No Memory loss, No Confusion, or Hallucinations   Past Medical History  Diagnosis Date  . CHF (congestive heart failure) ([place])   . Hypertension   . Arthritis      History reviewed. No pertinent past surgical history.    Prior to Admission medications   Medication Sig Start Date End Date Taking? Authorizing Provider  carvedilol (COREG) 25 MG tablet Take 25 mg by mouth 2 (two) times daily with a meal.   Yes Historical Provider, MD  furosemide (LASIX) 40 MG tablet Take 60 mg by mouth 2 (two) times daily.   Yes Historical Provider, MD     No Known Allergies    Social History:  reports that he has quit smoking. He does not have any smokeless tobacco history on file. He reports that he does not drink alcohol or use illicit drugs.     History reviewed. No pertinent family history.     Physical Exam:  GEN:  Pleasant Morbidly Obese 54 y.o. African American male examined and in no acute distress; cooperative with exam Filed Vitals:   08/10/15 2320 08/10/15 2330 08/10/15 2345 08/11/15 0000  BP: 147/81 147/89 140/92 130/85  Pulse: 102 102 102 105  Temp:      TempSrc:      Resp: 18     SpO2: 92% 94% 91% 91%   Blood pressure  130/85, pulse 105, temperature 98.4 F (36.9 C), temperature source Oral, resp. rate 18, SpO2 91 %. PSYCH: He is alert and oriented x4; does not appear anxious does not appear depressed; affect is normal HEENT: Normocephalic and Atraumatic, Mucous membranes pink; PERRLA; EOM intact; Fundi:  Benign;  No scleral icterus, Nares: Patent, Oropharynx: Clear,  Fair Dentition,    Neck:  FROM, No Cervical Lymphadenopathy nor Thyromegaly or Carotid Bruit; No JVD; Breasts:: Not examined CHEST WALL: No tenderness CHEST: Normal respiration, clear to auscultation  bilaterally HEART: Regular rate and rhythm; no murmurs rubs or gallops BACK: No kyphosis or scoliosis; No CVA tenderness ABDOMEN: Positive Bowel Sounds, Obese, Soft Non-Tender, No Rebound or Guarding; No Masses, No Organomegaly, +Pannus. Rectal Exam: Not done EXTREMITIES: No  Cyanosis, Clubbing, or Edema; Venous Stasis Changes BLEs    +Ulceration on Left lateral Lower Exts. Genitalia: not examined PULSES: 2+ and symmetric SKIN: Normal hydration no rash or ulceration CNS:  Alert and Oriented x 4, No Focal Deficits Vascular: pulses palpable throughout    Labs on Admission:  Basic Metabolic Panel:  Recent Labs Lab 08/10/15 1925  NA 138  K 4.8  CL 102  CO2 25  GLUCOSE 78  BUN 80*  CREATININE 4.62*  CALCIUM 9.0   Liver Function Tests:  Recent Labs Lab 08/10/15 1925  AST 49*  ALT 28  ALKPHOS 67  BILITOT 0.8  PROT 7.4  ALBUMIN 2.7*   No results for input(s): LIPASE, AMYLASE in the last 168 hours. No results for input(s): AMMONIA in the last 168 hours. CBC:  Recent Labs Lab 08/10/15 1925  WBC 12.3*  NEUTROABS 9.4*  HGB 8.6*  HCT 26.3*  MCV 79.9  PLT 267   Cardiac Enzymes: No results for input(s): CKTOTAL, CKMB, CKMBINDEX, TROPONINI in the last 168 hours.  BNP (last 3 results) No results for input(s): BNP in the last 8760 hours.  ProBNP (last 3 results) No results for input(s): PROBNP in the last 8760 hours.  CBG: No results for input(s): GLUCAP in the last 168 hours.  Radiological Exams on Admission: Dg Thoracic Spine 2 View  08/10/2015  CLINICAL DATA:  Back pain after a fall today. EXAM: THORACIC SPINE 2 VIEWS COMPARISON:  None. FINDINGS: Normal alignment of the thoracic spine. Diffuse degenerative changes with bridging anterior osteophytes throughout. No vertebral compression deformities. Bone cortex appears intact. No focal bone lesion or bone destruction. No paraspinal soft tissue swelling. IMPRESSION: Diffuse degenerative change throughout the  thoracic spine with bridging anterior osteophytes. No acute displaced fractures identified. Electronically Signed   By: Lucienne Capers M.D.   On: 08/10/2015 19:09   Dg Lumbar Spine 2-3 Views  08/10/2015  CLINICAL DATA:  Fall, back pain EXAM: LUMBAR SPINE - 2-3 VIEW COMPARISON:  None. FINDINGS: Two views of lumbar spine submitted. No acute fracture or subluxation. Multiple level anterior endplate osteophytes. Bridging anterior osteophytes at L1-L2 and L2-L3 level. Right lateral osteophytes noted at L2-L3 level. Disc space flattening at L4-L5 and L5-S1 level. Facet degenerative changes L4 and L5 level. IMPRESSION: No acute fracture or subluxation. Multilevel degenerative changes as described above. Electronically Signed   By: Lahoma Crocker M.D.   On: 08/10/2015 19:11   Mr Thoracic Spine Wo Contrast  08/10/2015  CLINICAL DATA:  Initial evaluation for acute low back pain with right hip pain. Incontinence. Status post fall. EXAM: MRI THORACIC SPINE WITHOUT CONTRAST TECHNIQUE: Multiplanar, multisequence MR imaging of the thoracic spine was performed. No intravenous contrast was administered. COMPARISON:  Prior  radiograph from earlier the same day. FINDINGS: Vertebral bodies are normally aligned with preservation of the normal thoracic kyphosis. There is mild chronic height loss at the superior endplate of 624THL, chronic in nature. No associated retropulsion. Small Schmorl's nodes at the superior endplates of T2 and 624THL. Minimal edema about the Schmorl's node at T12. Otherwise, vertebral body heights are well maintained. No fracture. Mild diffuse decreased it T1 signal intensity throughout the vertebral body bone marrow noted, likely within normal limits. No focal osseous lesions. No marrow edema. Signal intensity within the thoracic spinal cord is within normal limits. No paraspinous soft tissue abnormality. Diffusely flowing osteophytes seen throughout the mid and lower thoracic spine, suggesting DISH. No  significant disc bulge within the thoracic spine. No significant canal or foraminal stenosis. IMPRESSION: 1. No acute abnormality within the thoracic spine. No cord compression or significant stenosis. 2. Mild chronic height loss at the superior endplate of 624THL. 3. Diffusely flowing bridging osteophytes throughout the mid and lower thoracic spine, suggesting DISH. Electronically Signed   By: Jeannine Boga M.D.   On: 08/10/2015 22:54   Mr Lumbar Spine Wo Contrast  08/10/2015  CLINICAL DATA:  Initial evaluation for acute low back pain with pain and right hip status post fall, weakness. EXAM: MRI LUMBAR SPINE WITHOUT CONTRAST TECHNIQUE: Multiplanar, multisequence MR imaging of the lumbar spine was performed. No intravenous contrast was administered. COMPARISON:  Prior radiograph from earlier the same day. FINDINGS: Study is limited by body habitus. The purposes of this dictation, the lowest well-formed intervertebral disc spaces presumed to be the L5-S1 level, and there presumed to be 5 lumbar type vertebral bodies. Vertebral bodies are normally aligned with preservation of the normal lumbar lordosis. Vertebral body heights are maintained. No fracture. Somewhat diffusely decreased T1 signal intensity within the visualized vertebral body bone marrow, but still likely within normal limits. Conus medullaris terminates normally at the T12-L1 level. Signal intensity within the visualized cord is normal. Nerve roots of the cauda equina are normal in appearance. Paraspinous soft tissues demonstrate no acute abnormality. Diffuse congenital shortening of the pedicles present. No significant degenerative pathology present at T11-12 and T12-L1. L1-2: Degenerative disc desiccation with minimal disc bulge. Prominent osteophytic spurring along the right anterolateral aspect of the L1-2 disc space. No focal disc herniation. No significant canal stenosis. Mild edema about the right L1-2 facet likely related to degenerative  changes. Very mild right foraminal. No neural impingement. L2-3:  Negative. L3-4: No disc bulge or disc protrusion. Intervertebral disc is well hydrated. Bilateral facet arthrosis, right greater than left. No significant canal or foraminal stenosis. No neural impingement. L4-5: Diffuse disc bulge with disc desiccation. There is a small shallow superimposed central disc protrusion which indents the ventral thecal sac. Mild bilateral facet arthrosis. These changes superimposed on short pedicles results in moderate canal stenosis. Mild left foraminal narrowing present. L5-S1: No disc bulge or disc protrusion. Very mild bilateral facet hypertrophy. No significant canal or foraminal stenosis. IMPRESSION: 1. No acute abnormality within the lumbar spine. 2. Shallow central disc protrusion at L4-5, which superimposed on mild facet arthrosis and short pedicles results in moderate canal stenosis. No other significant canal narrowing within the lumbar spine. No cord compression. 3. Severe right-sided facet arthrosis at L1-2 with associated reactive edema. 4. Mild degenerative disc bulge at L1-2 without stenosis. 5. Diffuse congenital shortening of the pedicles. Electronically Signed   By: Jeannine Boga M.D.   On: 08/10/2015 22:15     EKG: Independently reviewed.  Assessment/Plan:   54 y.o. male with  Active Problems:   1.     AKI (acute kidney injury) ([place])   IVFs   Monitor BUN/Cr     2.     CHF (congestive heart failure) ([facility])   Hold Lasix due to #1   Continue Carvedilol Rx   2D ECHO in AM     3.     Hypertension   Continue Carvedilol Rx     4.     Arthritis   Check ANA, and Rheumatoid Factor     5.    Stasis ulcer ([place])   Wound Care Evaluation     6.    Eye contusion   Monitor     7.    Falls- Multifactorial due to Arthritis Pain and ? Autoimmune Dz   Fall Precautions     8.    Weakness   Fall Precautions     9.    DVT Prophylaxis   Lovenox     Code Status:     FULL  CODE Family Communication:  No Family Present    Disposition Plan:    Inpatient Status        Time spent:  [facility] Pager [phone]   If [place] Please Contact the Day Rounding Team MD for [facility]  If 7PM-7AM, Please Contact Night-Floor Coverage  [email or web address] Password [email or web address] 08/11/2015, 12:31 AM     ADDENDUM:   Patient was seen and examined on 08/11/2015

## 2015-08-11 NOTE — Progress Notes (Addendum)
8:42 AM I agree with HPI/GPe and A/P per Dr. Gasper Lloyd  54 y/o ? No records in our system Reported CHF, hypertension, arthritis No heart attack  Admitted to [facility]  11.26.16 with fall X2 and unable to hold weight, noted lower back and right hip pain bilateral lower extremity edema and incontinence Has intermittent LE edema for about 2-3 yrs  Its from fluid per him-he had imaging studies done 1 month ago at [facility] He has recently moved back from [place], Dr. 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 AKI (acute kidney injury) ([place]) 08/11/2015  . CHF (congestive heart failure) ([place]) 08/11/2015  . Hypertension 08/11/2015  . Arthritis 08/11/2015  . Stasis ulcer ([place]) 08/11/2015  . Eye contusion 08/11/2015  . Acute back pain   . Weakness of both legs   . Falls    Fall and weakness-unclear etiology, imaging studies MRI x-rays done on admission were negative for acute finding. PT OT may need to come by and see him. At baseline he lives with his cousin at home and is pretty limited in terms of ADLs His eye seems to be stable  Left lower extremity full-thickness ulcer 1.4 X4 X0.4 centimeters-wound care has seen him and recommended calcium alginate daily dressings and then subsequent 3 times a week-may need debridement of the area.  Possible rheumatoid  arthritis based on review of lab work from [employer] [employer]. We will obtain x-ray of the feet bilaterally as he has significant pain in his ankle and his knee which may be from this. I will obtain a CCP to determine severity of disease We will probably need to determine if we can start a DMARD in Hosptial as I am not convinced the patient will keep follow-up appt's  Chronic kidney disease stage  IV to 5 Baseline creatinine not sure His baseline weight at [facility] was 172 kilograms and he is currently 1 84 cc he'll probably need diuresis We will consult nephrology in the morning I would discontinue the saline 75 cc/hour and start IV diuresis I suspect some of his lower extremity swelling causes the pain as well  Body mass index is 56.75 kg/(m^2). Life-threatening  Uncontrolled hypertension Cannot use ACE inhibitor Continue Coreg 25 twice a day  Acute decompensated probable diastolic heart failure-echo is still pending Obtain echo Follow strict I's and O's Diuresis with 80 mg of IV Lasix twice a day  Anemia probably of renal disease vs blood loss anemia At this tieunclea r-iron stuides in am Hemoccult stool  Verneita Griffes, MD [facility] [phone]

## 2015-08-11 NOTE — Progress Notes (Signed)
Wound : black to skin on perimeter of wound .wound red and moist. 1.4cm x 4cm x 0.4cm per wound nurse  small amount of light yellow exudate drainage . Wound care washed with NS and packed with Ca+ alginate covered with gauze 4x4 and wrapped with Kerlix. Moisturizer (Eucerin) applied to lower extremities . Patient tolerated procedure  well.

## 2015-08-11 NOTE — Consult Note (Signed)
WOC wound consult note Reason for Consult: left lateral LE full thickness ulcer; patient states it has been present for approximately 2-3 weeks. Wound type: venous insufficiency Pressure Ulcer POA: No Measurement: 1.4cm x 4cm x 0.4cm Wound bed: ruddy red, moist Drainage (amount, consistency, odor) small amount of light yellow exudate Periwound: extremely dry and cracked Dressing procedure/placement/frequency: Wound care orders are provided for Nursing to wash and apply a moisturizer (Eucerin) to bilateral LEs prior to providing local wound care to the left lateral full thickness wound.  They are to fill the defect with a Ca+ alginate dressing and change daily. Ultimately this dressing may be changed three times weekly (on M/W/F). For a patient of his body habitus, this wound may be difficult to dress.  If you agree that a [facility] will be helpful, please order. [place] nursing team will not follow, but will remain available to this patient, the nursing and medical teams.  Please re-consult if needed. Thanks, Maudie Flakes, MSN, RN, [place], Arther Abbott  Pager# [phone]

## 2015-08-12 ENCOUNTER — Inpatient Hospital Stay (HOSPITAL_COMMUNITY): Payer: Medicare Other

## 2015-08-12 ENCOUNTER — Encounter (HOSPITAL_COMMUNITY): Payer: Self-pay | Admitting: Student

## 2015-08-12 ENCOUNTER — Other Ambulatory Visit (HOSPITAL_COMMUNITY): Payer: Medicare Other

## 2015-08-12 DIAGNOSIS — I509 Heart failure, unspecified: Secondary | ICD-10-CM

## 2015-08-12 DIAGNOSIS — N184 Chronic kidney disease, stage 4 (severe): Secondary | ICD-10-CM

## 2015-08-12 DIAGNOSIS — I83019 Varicose veins of right lower extremity with ulcer of unspecified site: Secondary | ICD-10-CM

## 2015-08-12 DIAGNOSIS — D638 Anemia in other chronic diseases classified elsewhere: Secondary | ICD-10-CM

## 2015-08-12 LAB — FERRITIN: FERRITIN: 604 ng/mL — AB (ref 24–336)

## 2015-08-12 LAB — BASIC METABOLIC PANEL
ANION GAP: 10 (ref 5–15)
BUN: 71 mg/dL — ABNORMAL HIGH (ref 6–20)
CALCIUM: 8.7 mg/dL — AB (ref 8.9–10.3)
CO2: 25 mmol/L (ref 22–32)
Chloride: 104 mmol/L (ref 101–111)
Creatinine, Ser: 4.47 mg/dL — ABNORMAL HIGH (ref 0.61–1.24)
GFR, EST AFRICAN AMERICAN: 16 mL/min — AB (ref >60)
GFR, EST NON AFRICAN AMERICAN: 14 mL/min — AB (ref >60)
Glucose, Bld: 146 mg/dL — ABNORMAL HIGH (ref 65–99)
POTASSIUM: 4 mmol/L (ref 3.5–5.1)
SODIUM: 139 mmol/L (ref 135–145)

## 2015-08-12 LAB — CBC
HEMATOCRIT: 24.5 % — AB (ref 39.0–52.0)
Hemoglobin: 7.9 g/dL — ABNORMAL LOW (ref 13.0–17.0)
MCH: 25.7 pg — ABNORMAL LOW (ref 26.0–34.0)
MCHC: 32.2 g/dL (ref 30.0–36.0)
MCV: 79.8 fL (ref 78.0–100.0)
Platelets: 261 10*3/uL (ref 150–400)
RBC: 3.07 MIL/uL — ABNORMAL LOW (ref 4.22–5.81)
RDW: 16.9 % — ABNORMAL HIGH (ref 11.5–15.5)
WBC: 13.1 10*3/uL — AB (ref 4.0–10.5)

## 2015-08-12 LAB — RETICULOCYTES
RBC.: 3.07 MIL/uL — AB (ref 4.22–5.81)
RETIC COUNT ABSOLUTE: 76.8 10*3/uL (ref 19.0–186.0)
Retic Ct Pct: 2.5 % (ref 0.4–3.1)

## 2015-08-12 LAB — FOLATE: FOLATE: 7.4 ng/mL (ref >5.9)

## 2015-08-12 LAB — IRON AND TIBC
Iron: 15 ug/dL — ABNORMAL LOW (ref 45–182)
SATURATION RATIOS: 8 % — AB (ref 17.9–39.5)
TIBC: 183 ug/dL — ABNORMAL LOW (ref 250–450)
UIBC: 168 ug/dL

## 2015-08-12 LAB — VITAMIN B12: Vitamin B-12: 1827 pg/mL — ABNORMAL HIGH (ref 180–914)

## 2015-08-12 MED ORDER — PREDNISONE 10 MG PO TABS
60.0000 mg | ORAL_TABLET | Freq: Every day | ORAL | Status: DC
  Administered 2015-08-13 – 2015-08-14 (×2): 60 mg via ORAL
  Filled 2015-08-12 (×2): qty 1

## 2015-08-12 NOTE — Consult Note (Signed)
CARDIOLOGY CONSULT NOTE   Patient ID: Noah Garner MRN: [id number], DOB/AGE: Mar 18, 1961   Admit date: 08/10/2015 Date of Consult: 08/12/2015 Reason for Consult: CHF   Primary Physician: Nilda Simmer, MD Primary Cardiologist: Dr. Donnetta Hutching [facility])  HPI: Noah Garner is a 54 y.o. male with past medical history of morbid obesity, HTN, CHF, and rheumatoid arthritis (recently diagnosed, not on treatment) who presented to [name] on 08/10/2015 for worsening lower back pain, joint pain, and increased weakness. He reports decreased functional capacity over the past month and had two falls on his day of admission, which prompted him to seek medical attention.  He reports no recent changes in his respiratory status over the past several months. Says his breathing has been better since moving back to [place] earlier this year and seeing a cardiologist regularly. He does not weight himself daily but says his weight has increased over the past few months. In reviewing Care Everywhere, his weight was 380 lbs in 03/13/2015 when he was last seen by his Cardiologist at [facility]. Weight at this current admission was 406 lbs.  He denies any recent chest pain, orthopnea, PND, nausea, vomiting, or diaphoresis. Reports lower extremity edema and increased abdominal girth. He reports only being able to walk a few feet at a time, which has been his baseline for the past several months.  Was diagnosed with heart failure "many years ago". Last EF was around 50% in August. Was taking Lasix 60mg  BID and Coreg 25mg  BID prior to admission. Reports good compliance with his medications.  Since being admitted, he has received 3 doses of Lasix 80mg  IV with a net output of -431mL, however he was initially placed on IVF at time of admission due to AKI. After being started on Lasix 08/11/2015, he diuresed -2.4L that day.  Of note, the patient's creatinine at time of admission was 4.62. Improved to 4.47 on 08/12/2015. No  baseline labs are available for comparison.   Problem List  Past Medical History  Diagnosis Date  . CHF (congestive heart failure) ([place])   . Hypertension   . Arthritis     History reviewed. No pertinent past surgical history.   Allergies: No Known Allergies    Inpatient Medications . carvedilol  25 mg Oral BID WC  . furosemide  80 mg Intravenous Q12H  . heparin  5,000 Units Subcutaneous 3 times per day  . hydrocerin   Topical Daily  . predniSONE  60 mg Oral QAC breakfast  . sodium chloride  3 mL Intravenous Q12H    Family History Family History  Problem Relation Age of Onset  . Hypertension Mother      Social History Social History   Social History  . Marital Status: Single    Spouse Name: N/A  . Number of Children: N/A  . Years of Education: N/A   Occupational History  . Not on file.   Social History Main Topics  . Smoking status: Former Smoker    Quit date: 08/12/1995  . Smokeless tobacco: Not on file  . Alcohol Use: No  . Drug Use: No  . Sexual Activity: Not on file   Other Topics Concern  . Not on file   Social History Narrative     Review of Systems General:  No chills, fever, or night sweats. Positive for weight gain. Cardiovascular:  No chest pain, dyspnea on exertion, orthopnea, palpitations, paroxysmal nocturnal dyspnea. Positive for edema. Dermatological: No rash, lesions/masses Respiratory: No cough, dyspnea Urologic: No hematuria,  dysuria Abdominal:   No nausea, vomiting, diarrhea, bright red blood per rectum, melena, or hematemesis Neurologic:  No visual changes, wkns, changes in mental status. MSK: Positive for generalized pain in back and knees bilaterally. All other systems reviewed and are otherwise negative except as noted above.  Physical Exam  Blood pressure 111/67, pulse 93, temperature 99.8 F (37.7 C), temperature source Oral, resp. rate 18, height 5\' 11"  (1.803 m), weight 404 lb 5.2 oz (183.4 kg), SpO2 95 %.  General:  Morbidly obese African American male in NAD Psych: Normal affect. Neuro: Alert and oriented X 3. Moves all extremities spontaneously. HEENT: Normal  Neck: Supple without bruits.  JVD unable to be assessed secondary to body habitus. Lungs:  Resp regular and unlabored, Distant breath sounds. Heart: RRR no s3, s4, or murmurs. Abdomen: Soft, non-tender, non-distended, BS + x 4.  Extremities: No clubbing or cyanosis. Left lower leg wrapped with Kerlix. 2+ pitting edema bilaterally. DP/PT/Radials 2+ and equal bilaterally.  Labs: No results for input(s): CKTOTAL, CKMB, TROPONINI in the last 72 hours. Lab Results  Component Value Date   WBC 13.1* 08/12/2015   HGB 7.9* 08/12/2015   HCT 24.5* 08/12/2015   MCV 79.8 08/12/2015   PLT 261 08/12/2015    Recent Labs Lab 08/10/15 1925  08/12/15 0317  NA 138  < > 139  K 4.8  < > 4.0  CL 102  < > 104  CO2 25  < > 25  BUN 80*  < > 71*  CREATININE 4.62*  < > 4.47*  CALCIUM 9.0  < > 8.7*  PROT 7.4  --   --   BILITOT 0.8  --   --   ALKPHOS 67  --   --   ALT 28  --   --   AST 49*  --   --   GLUCOSE 78  < > 146*  < > = values in this interval not displayed. No results found for: CHOL, HDL, LDLCALC, TRIG No results found for: DDIMER  Radiology/Studies  Dg Thoracic Spine 2 View: 08/10/2015  CLINICAL DATA:  Back pain after a fall today. EXAM: THORACIC SPINE 2 VIEWS COMPARISON:  None. FINDINGS: Normal alignment of the thoracic spine. Diffuse degenerative changes with bridging anterior osteophytes throughout. No vertebral compression deformities. Bone cortex appears intact. No focal bone lesion or bone destruction. No paraspinal soft tissue swelling. IMPRESSION: Diffuse degenerative change throughout the thoracic spine with bridging anterior osteophytes. No acute displaced fractures identified. Electronically Signed   By: Lucienne Capers M.D.   On: 08/10/2015 19:09   Dg Lumbar Spine 2-3 Views: 08/10/2015  CLINICAL DATA:  Fall, back pain EXAM: LUMBAR  SPINE - 2-3 VIEW COMPARISON:  None. FINDINGS: Two views of lumbar spine submitted. No acute fracture or subluxation. Multiple level anterior endplate osteophytes. Bridging anterior osteophytes at L1-L2 and L2-L3 level. Right lateral osteophytes noted at L2-L3 level. Disc space flattening at L4-L5 and L5-S1 level. Facet degenerative changes L4 and L5 level. IMPRESSION: No acute fracture or subluxation. Multilevel degenerative changes as described above. Electronically Signed   By: Lahoma Crocker M.D.   On: 08/10/2015 19:11   Dg Knee 1-2 Views Right: 08/11/2015  CLINICAL DATA:  54 year old male with history of arthritis and right knee pain and swelling EXAM: RIGHT KNEE - 1-2 VIEW COMPARISON:  Concurrently obtained radiographs of the right ankle FINDINGS: Advanced tricompartmental degenerative osteoarthritis most severe in the medial and patellofemoral compartments. There is significant narrowing in the medial compartment with  near bone-on-bone contact. No acute fracture or malalignment. No lytic or blastic osseous lesion. Moderate suprapatellar knee joint effusion. IMPRESSION: 1. Moderate suprapatellar knee joint effusion is favored to be degenerative in origin. In the appropriate clinical setting, infection or hemarthrosis are difficult to exclude radiographically. 2. Advanced tricompartmental osteoarthritis most severe in the patellofemoral and medial compartments. Electronically Signed   By: Jacqulynn Cadet M.D.   On: 08/11/2015 11:03   Mr Thoracic Spine Wo Contrast: 08/10/2015  CLINICAL DATA:  Initial evaluation for acute low back pain with right hip pain. Incontinence. Status post fall. EXAM: MRI THORACIC SPINE WITHOUT CONTRAST TECHNIQUE: Multiplanar, multisequence MR imaging of the thoracic spine was performed. No intravenous contrast was administered. COMPARISON:  Prior radiograph from earlier the same day. FINDINGS: Vertebral bodies are normally aligned with preservation of the normal thoracic kyphosis.  There is mild chronic height loss at the superior endplate of 624THL, chronic in nature. No associated retropulsion. Small Schmorl's nodes at the superior endplates of T2 and 624THL. Minimal edema about the Schmorl's node at T12. Otherwise, vertebral body heights are well maintained. No fracture. Mild diffuse decreased it T1 signal intensity throughout the vertebral body bone marrow noted, likely within normal limits. No focal osseous lesions. No marrow edema. Signal intensity within the thoracic spinal cord is within normal limits. No paraspinous soft tissue abnormality. Diffusely flowing osteophytes seen throughout the mid and lower thoracic spine, suggesting DISH. No significant disc bulge within the thoracic spine. No significant canal or foraminal stenosis. IMPRESSION: 1. No acute abnormality within the thoracic spine. No cord compression or significant stenosis. 2. Mild chronic height loss at the superior endplate of 624THL. 3. Diffusely flowing bridging osteophytes throughout the mid and lower thoracic spine, suggesting DISH. Electronically Signed   By: Jeannine Boga M.D.   On: 08/10/2015 22:54   Mr Lumbar Spine Wo Contrast: 08/10/2015  CLINICAL DATA:  Initial evaluation for acute low back pain with pain and right hip status post fall, weakness. EXAM: MRI LUMBAR SPINE WITHOUT CONTRAST TECHNIQUE: Multiplanar, multisequence MR imaging of the lumbar spine was performed. No intravenous contrast was administered. COMPARISON:  Prior radiograph from earlier the same day. FINDINGS: Study is limited by body habitus. The purposes of this dictation, the lowest well-formed intervertebral disc spaces presumed to be the L5-S1 level, and there presumed to be 5 lumbar type vertebral bodies. Vertebral bodies are normally aligned with preservation of the normal lumbar lordosis. Vertebral body heights are maintained. No fracture. Somewhat diffusely decreased T1 signal intensity within the visualized vertebral body bone marrow,  but still likely within normal limits. Conus medullaris terminates normally at the T12-L1 level. Signal intensity within the visualized cord is normal. Nerve roots of the cauda equina are normal in appearance. Paraspinous soft tissues demonstrate no acute abnormality. Diffuse congenital shortening of the pedicles present. No significant degenerative pathology present at T11-12 and T12-L1. L1-2: Degenerative disc desiccation with minimal disc bulge. Prominent osteophytic spurring along the right anterolateral aspect of the L1-2 disc space. No focal disc herniation. No significant canal stenosis. Mild edema about the right L1-2 facet likely related to degenerative changes. Very mild right foraminal. No neural impingement. L2-3:  Negative. L3-4: No disc bulge or disc protrusion. Intervertebral disc is well hydrated. Bilateral facet arthrosis, right greater than left. No significant canal or foraminal stenosis. No neural impingement. L4-5: Diffuse disc bulge with disc desiccation. There is a small shallow superimposed central disc protrusion which indents the ventral thecal sac. Mild bilateral facet arthrosis. These changes  superimposed on short pedicles results in moderate canal stenosis. Mild left foraminal narrowing present. L5-S1: No disc bulge or disc protrusion. Very mild bilateral facet hypertrophy. No significant canal or foraminal stenosis. IMPRESSION: 1. No acute abnormality within the lumbar spine. 2. Shallow central disc protrusion at L4-5, which superimposed on mild facet arthrosis and short pedicles results in moderate canal stenosis. No other significant canal narrowing within the lumbar spine. No cord compression. 3. Severe right-sided facet arthrosis at L1-2 with associated reactive edema. 4. Mild degenerative disc bulge at L1-2 without stenosis. 5. Diffuse congenital shortening of the pedicles. Electronically Signed   By: Jeannine Boga M.D.   On: 08/10/2015 22:15    ECG: None on  File  ECHOCARDIOGRAM: Pending  ASSESSMENT AND PLAN  1. Chronic Diastolic CHF - In reviewing Care Everywhere, his weight was 380 lbs in 03/13/2015. Weight at this current admission was 406 lbs. - endorses edema and increased abdominal girth. Denies any new-onset dyspnea, orthopnea, or PND. - Since being admitted, he has received 3 doses of Lasix 80mg  IV with a net output of -472mL, however he was inittially placed on IVF at time of admission due to AKI. After being started on Lasix 08/11/2015, he diuresed -2.4L that day. Continue at current diuresis rate and dose. Will likely need Nephrology consult. - will obtain CXR and EKG. - new echo is pending. Patient last believes his EF was "55%".  2. Stage 4-5 CKD - creatinine at time of admission was 4.62.  - Improved to 4.47 on 08/12/2015. No baseline labs are available for comparison. - will attempt to get old data from primary care but Dr. Evette Georges office states they have no data other than Rheumatologic studies.   3. HTN - continue current medications  4. Chronic Normocytic Anemia - Hgb 7.9 on 08/12/2015. - undergoing workup by admitting team   Signed, Erma Heritage, PA-C 08/12/2015, 3:42 PM Pager: [phone] Patient seen and examined and history reviewed. Agree with above findings and plan. 54 yo WM new to our system presents for evaluation of back pain and left knee pain. Notes increased swelling in legs and abdomen. Weight gain ?26 lbs since this summer. Denies SOB. Also noted to have CKD stage 4 of unknown duration. Exam is consistent with volume overload with marked LE edema with chronic skin thickening. Lungs are clear. Ecg and CXR not ordered. Echo is pending. Most likely has chronic right heart failure and diastolic CHF.  Agree with IV diuretics but diuresis may be limited by his renal disease. May need to get nephrology involved. He will also need evaluation with sleep study as outpatient if not done previously. Continue beta  blocker. Sodium restriction. Not a candidate for ACEi/ARB due to CKD.  Peter Martinique, [place] 08/12/2015 4:06 PM

## 2015-08-12 NOTE — Progress Notes (Signed)
Noah Garner [id number] DOB: [birth year] DOA: 08/10/2015 PCP: Nilda Simmer, MD  Brief narrative:  54 y/o ? No records in our system Reported CHF, hypertension, arthritis No heart attack Admitted to [facility] 11.26.16 with fall X2 and unable to hold weight, noted lower back and right hip pain bilateral lower extremity edema and incontinence Has intermittent LE edema for about 2-3 yrs Its from fluid per him-he had imaging studies done 1 month ago at [facility] He has recently moved back from [place], Dr. Ilene Qua he has had a lot of his care at [facility] over there  Seen at [facility] and had labs done showing concern for Rheumatoid Arthritis  Admission labs revealing BUN/creatinine 80/4.6 Also found to have left lower extremity ulcer and wound care consulted Has had LE wound for 2-3 weeks- has been cleaning it with Neosporin -MRI T and L-spine negative for acute findings  Past medical history-As per Problem list Chart reviewed as below-   Consultants:  ortho  Procedures:  one  Antibiotics:  none   Subjective   Pain now much wors eon the L knee > R No cp No sb No n/v eatign fair   Objective    Interim History:   Telemetry: sinuis   Objective: Filed Vitals:   08/12/15 0050 08/12/15 0433 08/12/15 0800 08/12/15 1134  BP: 153/69 117/60 109/54 111/67  Pulse: 96 94 96 93  Temp: 98.5 F (36.9 C) 100.1 F (37.8 C) 99.7 F (37.6 C) 99.8 F (37.7 C)  TempSrc: Oral Oral Oral Oral  Resp: 18 18 18 18   Height:      Weight:  183.4 kg (404 lb 5.2 oz)    SpO2: 98% 94% 96% 95%    Intake/Output Summary (Last 24 hours) at 08/12/15 1410 Last data filed at 08/12/15 1340  Gross per 24 hour  Intake   1480 ml  Output   2025 ml  Net   -545 ml    Exam:  General: eomi obese, No jvd Cardiovascular: s1 s 2no m/r/g slight tachy Respiratory: clear but limited exam 2/2 to pain Abdomen:  obese Skin scar over R knee, r  knee very tedner-grade3 edema LE's Neuro intact  Data Reviewed: Basic Metabolic Panel:  Recent Labs Lab 08/10/15 1925 08/11/15 0454 08/12/15 0317  NA 138 139 139  K 4.8 4.2 4.0  CL 102 105 104  CO2 25 25 25   GLUCOSE 78 99 146*  BUN 80* 76* 71*  CREATININE 4.62* 4.51* 4.47*  CALCIUM 9.0 8.9 8.7*   Liver Function Tests:  Recent Labs Lab 08/10/15 1925  AST 49*  ALT 28  ALKPHOS 67  BILITOT 0.8  PROT 7.4  ALBUMIN 2.7*   No results for input(s): LIPASE, AMYLASE in the last 168 hours. No results for input(s): AMMONIA in the last 168 hours. CBC:  Recent Labs Lab 08/10/15 1925 08/11/15 0454 08/12/15 0317  WBC 12.3* 11.8* 13.1*  NEUTROABS 9.4*  --   --   HGB 8.6* 7.7* 7.9*  HCT 26.3* 23.8* 24.5*  MCV 79.9 80.1 79.8  PLT 267 257 261   Cardiac Enzymes: No results for input(s): CKTOTAL, CKMB, CKMBINDEX, TROPONINI in the last 168 hours. BNP: Invalid input(s): POCBNP CBG: No results for input(s): GLUCAP in the last 168 hours.  No results found for this or any previous visit (from the past 240 hour(s)).   Studies:              All Imaging reviewed and is as per above  notation   Scheduled Meds: . carvedilol  25 mg Oral BID WC  . furosemide  80 mg Intravenous Q12H  . heparin  5,000 Units Subcutaneous 3 times per day  . hydrocerin   Topical Daily  . sodium chloride  3 mL Intravenous Q12H   Continuous Infusions:    Assessment/Plan:  Fall and weakness-unclear etiology, imaging studies MRI x-rays done on admission were negative for acute finding.  PT OT may need to come by and see him.  At baseline he lives with his cousin at home and is pretty limited in terms of ADLs  Left lower extremity full-thickness ulcer 1.4 X4 X0.4 centimeters -wound care has seen him and recommended calcium alginate daily dressings and then subsequent 3 times a week-may need debridement of the area.  OA on R knee initially asked ortho to come by and look at his Knee for ?  Injection Would probably hold off on injeciton at this juncture as patient has migrating polyarthropathy-see below  Possible rheumatoid arthritis based on review of lab work from [employer]. I will obtain a CCP to determine severity of disease We will probably need to determine if we can start a DMARD in [place] as I am not convinced the patient will keep follow-up appt's 11/28 start prednisone 60 mgdaily as he has migrating arthritis and monitor for improvement  Chronic kidney disease stage IV to 5 Baseline creatinine not sure His baseline weight at [facility] was 172 kilograms and he is currently 184 cc he'll probably need diuresis I would discontinue the saline 75 cc/hour and start IV diuresis I suspect some of his lower extremity swelling causes the pain as well  Body mass index is 56.75 kg/(m^2). Life-threatening  Uncontrolled hypertension Cannot use ACE inhibitor Continue Coreg 25 twice a day  Acute decompensated probable diastolic heart failure-echo is still pending Follow strict I's and O's, only -400 cc?? Diuresis with 80 mg of IV Lasix twice a day We will ask Cardiology to look in on him and assist with management  Anemia probably of renal disease vs blood loss anemia At this time  unclear-iron stuides in am Hemoccult stool   Verneita Griffes, MD  [facility] Pager [phone] 08/12/2015, 2:10 PM    LOS: 1 day

## 2015-08-12 NOTE — Progress Notes (Signed)
Utilization review completed. Eldred Lievanos, RN, BSN. 

## 2015-08-13 ENCOUNTER — Other Ambulatory Visit: Payer: Self-pay | Admitting: Student

## 2015-08-13 ENCOUNTER — Inpatient Hospital Stay (HOSPITAL_COMMUNITY): Payer: Medicare Other

## 2015-08-13 DIAGNOSIS — I509 Heart failure, unspecified: Secondary | ICD-10-CM

## 2015-08-13 DIAGNOSIS — I5033 Acute on chronic diastolic (congestive) heart failure: Secondary | ICD-10-CM

## 2015-08-13 DIAGNOSIS — I10 Essential (primary) hypertension: Secondary | ICD-10-CM

## 2015-08-13 LAB — BASIC METABOLIC PANEL
ANION GAP: 8 (ref 5–15)
BUN: 69 mg/dL — ABNORMAL HIGH (ref 6–20)
CALCIUM: 8.9 mg/dL (ref 8.9–10.3)
CO2: 26 mmol/L (ref 22–32)
Chloride: 104 mmol/L (ref 101–111)
Creatinine, Ser: 4.55 mg/dL — ABNORMAL HIGH (ref 0.61–1.24)
GFR, EST AFRICAN AMERICAN: 15 mL/min — AB (ref >60)
GFR, EST NON AFRICAN AMERICAN: 13 mL/min — AB (ref >60)
GLUCOSE: 136 mg/dL — AB (ref 65–99)
POTASSIUM: 4.1 mmol/L (ref 3.5–5.1)
Sodium: 138 mmol/L (ref 135–145)

## 2015-08-13 LAB — CYCLIC CITRUL PEPTIDE ANTIBODY, IGG/IGA: CCP Antibodies IgG/IgA: 6 units (ref 0–19)

## 2015-08-13 MED ORDER — SODIUM CHLORIDE 0.9 % IV SOLN
510.0000 mg | Freq: Once | INTRAVENOUS | Status: AC
  Administered 2015-08-14: 510 mg via INTRAVENOUS
  Filled 2015-08-13: qty 17

## 2015-08-13 MED ORDER — CARVEDILOL 12.5 MG PO TABS
12.5000 mg | ORAL_TABLET | Freq: Two times a day (BID) | ORAL | Status: DC
  Administered 2015-08-14: 12.5 mg via ORAL
  Filled 2015-08-13: qty 2

## 2015-08-13 MED ORDER — FUROSEMIDE 10 MG/ML IJ SOLN
160.0000 mg | Freq: Four times a day (QID) | INTRAMUSCULAR | Status: DC
  Administered 2015-08-13 – 2015-08-14 (×3): 160 mg via INTRAVENOUS
  Filled 2015-08-13 (×5): qty 16

## 2015-08-13 MED ORDER — DARBEPOETIN ALFA 200 MCG/0.4ML IJ SOSY
200.0000 ug | PREFILLED_SYRINGE | Freq: Once | INTRAMUSCULAR | Status: AC
  Administered 2015-08-14: 200 ug via SUBCUTANEOUS
  Filled 2015-08-13: qty 0.4

## 2015-08-13 MED ORDER — DEXTROSE 5 % IV SOLN
120.0000 mg | Freq: Two times a day (BID) | INTRAVENOUS | Status: DC
  Filled 2015-08-13 (×2): qty 12

## 2015-08-13 MED ORDER — PERFLUTREN LIPID MICROSPHERE
1.0000 mL | INTRAVENOUS | Status: AC | PRN
  Administered 2015-08-13: 2 mL via INTRAVENOUS
  Filled 2015-08-13: qty 10

## 2015-08-13 NOTE — Evaluation (Signed)
Physical Therapy Evaluation Patient Details Name: Noah Garner MRN: [id number] DOB: [birth year] Today's Date: 08/13/2015   History of Present Illness  Brackston Marciante is a 54 y.o. male with past medical history of morbid obesity, HTN, CHF, and rheumatoid arthritis (recently diagnosed, not on treatment) who presented to [name] on 08/10/2015 for worsening lower back pain, joint pain, and increased weakness. He reports decreased functional capacity over the past month and had two falls on his day of admission, which prompted him to seek medical attention.  Clinical Impression  Pt admitted with/for fall, inc weakness and decreased functional capacity..  Pt currently limited functionally due to the problems listed. ( See problems list.)   Pt will benefit from PT to maximize function and safety in order to get ready for next venue listed below.     Follow Up Recommendations SNF    Equipment Recommendations  None recommended by PT (TBA)    Recommendations for Other Services       Precautions / Restrictions Precautions Precautions: Fall      Mobility  Bed Mobility Overal bed mobility: Needs Assistance Bed Mobility: Supine to Sit     Supine to sit: HOB elevated;Max assist     General bed mobility comments: needs significant assist moving bil LE's and moderate assist for trunk assist to come to EOB  Transfers                 General transfer comment: pt deferred attempt  Ambulation/Gait                Stairs            Wheelchair Mobility    Modified Rankin (Stroke Patients Only)       Balance Overall balance assessment: Needs assistance Sitting-balance support: No upper extremity supported Sitting balance-Leahy Scale: Good                                       Pertinent Vitals/Pain Pain Assessment: Faces Faces Pain Scale: Hurts whole lot Pain Location: legs, joints (knees and ankles) Pain Descriptors / Indicators: Grimacing  (appears sharp in nature) Pain Intervention(s): Limited activity within patient's tolerance    Home Living Family/patient expects to be discharged to:: Skilled nursing facility Living Arrangements: Other relatives Available Help at Discharge: Available 24 hours/day (cousin) Type of Home: House Home Access: Stairs to enter Entrance Stairs-Rails: Psychiatric nurse of Steps: 3 Home Layout: One level Home Equipment: Environmental consultant - 2 wheels      Prior Function Level of Independence: Independent with assistive device(s)         Comments: poor historian, but I infer that pt did very little lately     Hand Dominance   Dominant Hand: Right    Extremity/Trunk Assessment                         Communication   Communication: No difficulties  Cognition   Behavior During Therapy: Flat affect;Anxious Overall Cognitive Status: Within Functional Limits for tasks assessed (very poor historian)                      General Comments      Exercises        Assessment/Plan    PT Assessment Patient needs continued PT services  PT Diagnosis Generalized weakness;Acute pain;Difficulty walking   PT Problem  List Decreased strength;Decreased range of motion;Decreased activity tolerance;Decreased mobility;Decreased knowledge of use of DME;Decreased safety awareness;Pain  PT Treatment Interventions DME instruction;Functional mobility training;Therapeutic activities;Therapeutic exercise;Patient/family education   PT Goals (Current goals can be found in the Care Plan section) Acute Rehab PT Goals Patient Stated Goal: get better. PT Goal Formulation: With patient Time For Goal Achievement: 08/27/15 Potential to Achieve Goals: Fair    Frequency Min 3X/week   Barriers to discharge Decreased caregiver support Cousin working part of the day.    Co-evaluation               End of Session   Activity Tolerance: Patient limited by fatigue;Patient  tolerated treatment well Patient left: Other (comment);with call bell/phone within reach (sitting EOB) Nurse Communication: Mobility status         Time: [id number] PT Time Calculation (min) (ACUTE ONLY): 27 min   Charges:   PT Evaluation $Initial PT Evaluation Tier I: 1 Procedure PT Treatments $Therapeutic Activity: 8-22 mins   PT G Codes:        Arva Slaugh, Tessie Fass 08/13/2015, 2:19 PM 08/13/2015  Donnella Sham, PT [phone]  (pager)

## 2015-08-13 NOTE — Progress Notes (Signed)
RN noticed pt. With dried blood in scrotal area. Pt. Stated he has noticed blood for 2-3 weeks now in his urine when he empties his bladder. Pt. Stated he has not mentioned this to any doctor. No pain associated.

## 2015-08-13 NOTE — Progress Notes (Signed)
Pt. Moved to 3E17 and transferred to Bariatric bed. Pt. Stated he will notify family of room change in am.

## 2015-08-13 NOTE — Progress Notes (Signed)
Noah Garner [id number] DOB: 1961-04-11 DOA: 08/10/2015 PCP: Nilda Simmer, MD  Brief narrative:  54 y/o ? No records in our system Reported CHF, hypertension, arthritis No heart attack Admitted to [facility] 11.26.16 with fall X2 and unable to hold weight, noted lower back and right hip pain bilateral lower extremity edema and incontinence Has intermittent LE edema for about 2-3 yrs Its from fluid per him-he had imaging studies done 1 month ago at [facility] He has recently moved back from [place], Dr. Ilene Qua he has had a lot of his care at [facility] over there  Seen at [facility] and had labs done showing concern for Rheumatoid Arthritis  Admission labs revealing BUN/creatinine 80/4.6 Also found to have left lower extremity ulcer and wound care consulted Has had LE wound for 2-3 weeks- has been cleaning it with Neosporin -MRI T and L-spine negative for acute findings  Past medical history-As per Problem list Chart reviewed as below-  reviewed records from [facility] center in [place] Noted echocardiogram technically limited/5/13 mild to moderate reduction in LV EF Cardiac cath/5/13 = no evidence significant angiographic CAD Stress test 06/25/2011 no clinical, EKG significant CAD Echo 03/29/11 EF 25-30% global hypokinesis mild MR and TR.   He was offered at the time BiPAP/CPAP machine but declined and has never used one   Consultants:  ortho  Procedures:  one  Antibiotics:  none   Subjective   Pain in both knees is fine No nausea no vomiting Diuresis continues Cousin at bedside-cousin pulls me side and says that patient is very noncompliant and has never even tried CPAP machine although he claims intolerance of the same with me    Objective    Interim History:   Telemetry: sinuis   Objective: Filed Vitals:   08/12/15 2032 08/13/15 0300 08/13/15 0400 08/13/15 1141  BP: 103/50  111/52  124/64  Pulse: 92  97 92  Temp: 99.2 F (37.3 C)  99.1 F (37.3 C) 99.4 F (37.4 C)  TempSrc: Oral  Oral Oral  Resp: 19  20 18   Height:      Weight:  179.171 kg (395 lb)    SpO2: 92%  91% 90%    Intake/Output Summary (Last 24 hours) at 08/13/15 1509 Last data filed at 08/13/15 1321  Gross per 24 hour  Intake   1440 ml  Output   1150 ml  Net    290 ml    Exam:  General: eomi obese, No jvd Cardiovascular: s1 s 2no m/r/g slight tachy Respiratory: clear but limited exam 2/2 to pain Abdomen:  obese Skin scar over R knee,both knees are nontender  Neuro intact  Data Reviewed: Basic Metabolic Panel:  Recent Labs Lab 08/10/15 1925 08/11/15 0454 08/12/15 0317 08/13/15 0810  NA 138 139 139 138  K 4.8 4.2 4.0 4.1  CL 102 105 104 104  CO2 25 25 25 26   GLUCOSE 78 99 146* 136*  BUN 80* 76* 71* 69*  CREATININE 4.62* 4.51* 4.47* 4.55*  CALCIUM 9.0 8.9 8.7* 8.9   Liver Function Tests:  Recent Labs Lab 08/10/15 1925  AST 49*  ALT 28  ALKPHOS 67  BILITOT 0.8  PROT 7.4  ALBUMIN 2.7*   No results for input(s): LIPASE, AMYLASE in the last 168 hours. No results for input(s): AMMONIA in the last 168 hours. CBC:  Recent Labs Lab 08/10/15 1925 08/11/15 0454 08/12/15 0317  WBC 12.3* 11.8* 13.1*  NEUTROABS 9.4*  --   --  HGB 8.6* 7.7* 7.9*  HCT 26.3* 23.8* 24.5*  MCV 79.9 80.1 79.8  PLT 267 257 261   Cardiac Enzymes: No results for input(s): CKTOTAL, CKMB, CKMBINDEX, TROPONINI in the last 168 hours. BNP: Invalid input(s): POCBNP CBG: No results for input(s): GLUCAP in the last 168 hours.  No results found for this or any previous visit (from the past 240 hour(s)).   Studies:              All Imaging reviewed and is as per above notation   Scheduled Meds: . carvedilol  25 mg Oral BID WC  . furosemide  80 mg Intravenous Q12H  . heparin  5,000 Units Subcutaneous 3 times per day  . hydrocerin   Topical Daily  . predniSONE  60 mg Oral QAC breakfast  .  sodium chloride  3 mL Intravenous Q12H   Continuous Infusions:    Assessment/Plan:  Fall and weakness-unclear etiology, imaging studies MRI x-rays done on admission were negative for acute finding.  PT OT may need to come by and see him.  At baseline he lives with his cousin at home and is pretty limited in terms of ADLs  Left lower extremity full-thickness ulcer 1.4 x 4 x 0.4 centimeters -wound care has seen him and recommended calcium alginate daily dressings and then subsequent 3 times a week-may need debridement of the area.  OA on R knee initially asked ortho to come by and look at his Knee for ? Injection Would probably hold off on injeciton at this juncture as patient has migrating polyarthropathy-see below  Possible rheumatoid arthritis based on review of lab work from [employer]. I will obtain a CCP to determine severity of disease We will probably need to determine if we can start a DMARD in Hospital? I started patient on 11/28 start prednisone 60 mgdaily as he has migrating arthritis He has shown some improvement  Chronic kidney disease stage IV to 5 Baseline creatinine not sure His baseline weight at [facility] was 172 kilograms and he is currently 179 cc  I would discontinue the saline 75 cc/hour and start IV diuresis I suspect some of his lower extremity swelling causes the pain as well We will consult nephrology  Body mass index is 56.75 kg/(m^2). Life-threatening  Uncontrolled hypertension Cannot use ACE inhibitor Continue Coreg 25 twice a day  Acute decompensated probable diastolic heart failure-echo is still pending Follow strict I's and O's, only -660?? Diuresis with 80 mg of IV Lasix twice a day--increased to lasix 120 bid IV Appreciate Cardiology input  Anemia probably of renal disease vs blood loss anemia At this time  unclear-iron stuides in am Hemoccult stool   Verneita Griffes, MD  [facility] Pager [phone] 08/13/2015, 3:09  PM    LOS: 2 days

## 2015-08-13 NOTE — Progress Notes (Signed)
Physical Therapy Treatment Patient Details Name: Noah Garner MRN: [id number] DOB: 06-29-1961 Today's Date: 08/13/2015    History of Present Illness Noah Garner is a 54 y.o. male with past medical history of morbid obesity, HTN, CHF, and rheumatoid arthritis (recently diagnosed, not on treatment) who presented to [name] on 08/10/2015 for worsening lower back pain, joint pain, and increased weakness. He reports decreased functional capacity over the past month and had two falls on his day of admission, which prompted him to seek medical attention.    PT Comments    Pt significantly limited in mobility due to pain and stiffness R> L LE.   For safety, pt needs 2 person assist to/from EOB  Follow Up Recommendations  SNF     Equipment Recommendations  None recommended by PT    Recommendations for Other Services       Precautions / Restrictions Precautions Precautions: Fall    Mobility  Bed Mobility Overal bed mobility: Needs Assistance Bed Mobility: Sit to Supine;Rolling Rolling: Max assist   Supine to sit: HOB elevated;Max assist Sit to supine: Max assist;+2 for physical assistance   General bed mobility comments: significant truncal and bil LE assist to maneuver into bed.  mod/max to roll for pericare and pad placement  Transfers                 General transfer comment: pt deferred attempt  Ambulation/Gait                 Stairs            Wheelchair Mobility    Modified Rankin (Stroke Patients Only)       Balance Overall balance assessment: Needs assistance Sitting-balance support: No upper extremity supported Sitting balance-Leahy Scale: Good                              Cognition Arousal/Alertness: Awake/alert Behavior During Therapy: Flat affect Overall Cognitive Status: Within Functional Limits for tasks assessed                      Exercises      General Comments General comments (skin  integrity, edema, etc.): per pt's cousin, pt has had trouble moving for ?2 years, takes extraordinarily long time with RW to get to front of the house and sits in one spot for 10-12 hours without going to the bathroom.      Pertinent Vitals/Pain Pain Assessment: Faces Pain Score: 4  Faces Pain Scale: Hurts even more Pain Location: legs Pain Descriptors / Indicators: Grimacing Pain Intervention(s): Monitored during session    Home Living Family/patient expects to be discharged to:: Skilled nursing facility Living Arrangements: Other relatives Available Help at Discharge: Available 24 hours/day (cousin) Type of Home: House Home Access: Stairs to enter Entrance Stairs-Rails: Right;Left Home Layout: One level Home Equipment: Environmental consultant - 2 wheels      Prior Function Level of Independence: Independent with assistive device(s)      Comments: poor historian, but I infer that pt did very little lately   PT Goals (current goals can now be found in the care plan section) Acute Rehab PT Goals Patient Stated Goal: get better. PT Goal Formulation: With patient Time For Goal Achievement: 08/27/15 Potential to Achieve Goals: Fair Progress towards PT goals: Progressing toward goals    Frequency  Min 3X/week    PT Plan Current plan remains appropriate  Co-evaluation             End of Session   Activity Tolerance: Patient limited by pain;Patient limited by fatigue;Patient tolerated treatment well Patient left: in bed;with call bell/phone within reach;with bed alarm set     Time: [id number] PT Time Calculation (min) (ACUTE ONLY): 26 min  Charges:  $Therapeutic Activity: 23-37 mins                    G Codes:      Beyonka Pitney, Tessie Fass 08/13/2015, 2:31 PM 08/13/2015  Donnella Sham, PT [phone]  (pager)

## 2015-08-13 NOTE — Consult Note (Addendum)
54 year old male, who is a poor historian, with a hx of hypertension treated since his 66s and he thinks he might have seen a kidney specialist in [place] a couple of years ago.  He has a positive RF of 19.9(<13.9) and ANA 1:80 and elevated ESR and CRP on 07/16/15 at [facility]. He presented to [facility] after a couple of falls at his current residence.  He was found to have a creatinine of 4.19m/dl. He is swollen.  He admits to myoclonic jerks for the past couple of weeks.  He reports a good appetite and no nausea or vomiting. He denies a known family history of ESRD. He denies diabetes mellitus.  UA reveals 360mdl protein. EF 30-35% by 2d today.  Past Medical History  Diagnosis Date  . CHF (congestive heart failure) ([facility]  . Hypertension   . Arthritis    History reviewed. No pertinent past surgical history. Social History:  reports that he quit smoking about 20 years ago. He does not have any smokeless tobacco history on file. He reports that he does not drink alcohol or use illicit drugs. Allergies: No Known Allergies Family History  Problem Relation Age of Onset  . Hypertension Mother     Medications:  Scheduled: . carvedilol  25 mg Oral BID WC  . furosemide  120 mg Intravenous BID  . heparin  5,000 Units Subcutaneous 3 times per day  . hydrocerin   Topical Daily  . predniSONE  60 mg Oral QAC breakfast  . sodium chloride  3 mL Intravenous Q12H   ROJSH:FWYOVZCHYoncontributory.  Blood pressure 124/64, pulse 92, temperature 99.4 F (37.4 C), temperature source Oral, resp. rate 18, height '5\' 11"'  (1.803 m), weight 179.171 kg (395 lb), SpO2 90 %.  General appearance: alert and cooperative morbidly obese Head: Normocephalic, without obvious abnormality, atraumatic Eyes: negative Resp: clear to auscultation bilaterally Chest wall: no tenderness Cardio: regular rate and rhythm, S1, S2 normal, no murmur, click, rub or gallop GI: soft, non-tender; bowel sounds normal; no masses,   no organomegaly and obese Extremities: edema marked chronic changes and 3+ edema, dry skin, wrapped Skin: legs with dry craking foul smelling legs some deformity of right hand with ulnar dev Neurologic: Grossly normal mild asterixis and occ myoclonus witnessed Results for orders placed or performed during the hospital encounter of 08/10/15 (from the past 48 hour(s))  Basic metabolic panel     Status: Abnormal   Collection Time: 08/12/15  3:17 AM  Result Value Ref Range   Sodium 139 135 - 145 mmol/L   Potassium 4.0 3.5 - 5.1 mmol/L   Chloride 104 101 - 111 mmol/L   CO2 25 22 - 32 mmol/L   Glucose, Bld 146 (H) 65 - 99 mg/dL   BUN 71 (H) 6 - 20 mg/dL   Creatinine, Ser 4.47 (H) 0.61 - 1.24 mg/dL   Calcium 8.7 (L) 8.9 - 10.3 mg/dL   GFR calc non Af Amer 14 (L) >60 mL/min   GFR calc Af Amer 16 (L) >60 mL/min    Comment: (NOTE) The eGFR has been calculated using the CKD EPI equation. This calculation has not been validated in all clinical situations. eGFR's persistently <60 mL/min signify possible Chronic Kidney Disease.    Anion gap 10 5 - 15  CBC     Status: Abnormal   Collection Time: 08/12/15  3:17 AM  Result Value Ref Range   WBC 13.1 (H) 4.0 - 10.5 K/uL   RBC  3.07 (L) 4.22 - 5.81 MIL/uL   Hemoglobin 7.9 (L) 13.0 - 17.0 g/dL   HCT 24.5 (L) 39.0 - 52.0 %   MCV 79.8 78.0 - 100.0 fL   MCH 25.7 (L) 26.0 - 34.0 pg   MCHC 32.2 30.0 - 36.0 g/dL   RDW 16.9 (H) 11.5 - 15.5 %   Platelets 261 150 - 400 K/uL  Vitamin B12     Status: Abnormal   Collection Time: 08/12/15  3:17 AM  Result Value Ref Range   Vitamin B-12 1827 (H) 180 - 914 pg/mL    Comment: (NOTE) This assay is not validated for testing neonatal or myeloproliferative syndrome specimens for Vitamin B12 levels.   Folate     Status: None   Collection Time: 08/12/15  3:17 AM  Result Value Ref Range   Folate 7.4 >5.9 ng/mL  Iron and TIBC     Status: Abnormal   Collection Time: 08/12/15  3:17 AM  Result Value Ref Range    Iron 15 (L) 45 - 182 ug/dL   TIBC 183 (L) 250 - 450 ug/dL   Saturation Ratios 8 (L) 17.9 - 39.5 %   UIBC 168 ug/dL  Ferritin     Status: Abnormal   Collection Time: 08/12/15  3:17 AM  Result Value Ref Range   Ferritin 604 (H) 24 - 336 ng/mL  Reticulocytes     Status: Abnormal   Collection Time: 08/12/15  3:17 AM  Result Value Ref Range   Retic Ct Pct 2.5 0.4 - 3.1 %   RBC. 3.07 (L) 4.22 - 5.81 MIL/uL   Retic Count, Manual 76.8 19.0 - 186.0 K/uL  Basic metabolic panel     Status: Abnormal   Collection Time: 08/13/15  8:10 AM  Result Value Ref Range   Sodium 138 135 - 145 mmol/L   Potassium 4.1 3.5 - 5.1 mmol/L   Chloride 104 101 - 111 mmol/L   CO2 26 22 - 32 mmol/L   Glucose, Bld 136 (H) 65 - 99 mg/dL   BUN 69 (H) 6 - 20 mg/dL   Creatinine, Ser 4.55 (H) 0.61 - 1.24 mg/dL   Calcium 8.9 8.9 - 10.3 mg/dL   GFR calc non Af Amer 13 (L) >60 mL/min   GFR calc Af Amer 15 (L) >60 mL/min    Comment: (NOTE) The eGFR has been calculated using the CKD EPI equation. This calculation has not been validated in all clinical situations. eGFR's persistently <60 mL/min signify possible Chronic Kidney Disease.    Anion gap 8 5 - 15   Dg Chest 2 View  08/12/2015  CLINICAL DATA:  54 year old male with diastolic congestive heart failure, acute on chronic. Initial encounter. EXAM: CHEST  2 VIEW COMPARISON:  Report of [facility] chest radiographs 12/18/2013 (no images available). FINDINGS: Upright AP and lateral views of the chest. There is cardiomegaly. Other mediastinal contours are within normal limits. No pneumothorax, pulmonary edema, pleural effusion or consolidation. Visualized tracheal air column is within normal limits. No acute osseous abnormality identified. IMPRESSION: Cardiomegaly.  No acute cardiopulmonary abnormality. Electronically Signed   By: Genevie Ann M.D.   On: 08/12/2015 16:51    Assessment:  1 Chronic CKD, suspected stage 5 with possible uremia 2  Volume overload 3  Anemia,  iron deficient and  ESA deficient 4  LE wounds 5  Morbid obesity 6 Pos Rheumatoid factor 7 Hypertension  Plan: 1 Renal Ultrasound 2 Diurese 3 Give erythropoeitin and iron 4 Wound care and  hygeine 5 Check ammonia level & LFTs, phos, PTH  Krystyne Tewksbury C 08/13/2015, 5:55 PM

## 2015-08-13 NOTE — Evaluation (Signed)
Occupational Therapy Evaluation Patient Details Name: Noah Garner MRN: [id number] DOB: December 25, 1960 Today's Date: 08/13/2015    History of Present Illness Noah Garner is a 54 y.o. male with past medical history of morbid obesity, HTN, CHF, and rheumatoid arthritis (recently diagnosed, not on treatment) who presented to [name] on 08/10/2015 for worsening lower back pain, joint pain, and increased weakness. He reports decreased functional capacity over the past month and had two falls on his day of admission, which prompted him to seek medical attention.   Clinical Impression   Patient presenting with decreased ADL and functional mobility independence. Patient independent and living with cousin PTA. According to cousin, pt has been declining for the past 2+ years, therefore unsure of patient's acutal PLOF. Patient currently functioning at an overall mod to total assist +2. Patient will benefit from acute OT to increase overall independence in the areas of ADLs, functional mobility, and overall safety in order to safely discharge to venue listed below.     Follow Up Recommendations  SNF;Supervision/Assistance - 24 hour    Equipment Recommendations  Other (comment);3 in 1 bedside comode (TBD - suppose will need bariatric BSC)    Recommendations for Other Services Other (comment) (psych eval??)     Precautions / Restrictions Precautions Precautions: Fall Restrictions Weight Bearing Restrictions: No     Mobility Bed Mobility Overal bed mobility: Needs Assistance Bed Mobility: Sit to Supine;Rolling Rolling: Max assist;+2 for physical assistance   Supine to sit: HOB elevated;Max assist Sit to supine: Max assist;+2 for physical assistance   General bed mobility comments: significant truncal and bil LE assist to maneuver into bed.  mod/max +2 assist to roll for pericare and pad placement  Transfers General transfer comment: pt deferred attempt    Balance Overall balance  assessment: Needs assistance Sitting-balance support: No upper extremity supported;Feet supported Sitting balance-Leahy Scale: Good    ADL Overall ADL's : Needs assistance/impaired Eating/Feeding: Set up;Sitting   Grooming: Set up;Sitting   Upper Body Bathing: Minimal assitance;Sitting   Lower Body Bathing: Total assistance;Bed level   Upper Body Dressing : Minimal assistance;Sitting   Lower Body Dressing: Total assistance;Bed level     Toilet Transfer Details (indicate cue type and reason): did not occur Toileting- Clothing Manipulation and Hygiene: Total assistance;Bed level     Tub/Shower Transfer Details (indicate cue type and reason): did not occur                Pertinent Vitals/Pain Pain Assessment: Faces Pain Score: 4  Faces Pain Scale: Hurts even more Pain Location: LEs and right hand  Pain Descriptors / Indicators: Grimacing Pain Intervention(s): Monitored during session     Hand Dominance Right   Extremity/Trunk Assessment Upper Extremity Assessment Upper Extremity Assessment: Generalized weakness   Lower Extremity Assessment Lower Extremity Assessment: Defer to PT evaluation   Cervical / Trunk Assessment Cervical / Trunk Assessment: Normal   Communication Communication Communication: No difficulties   Cognition Arousal/Alertness: Awake/alert Behavior During Therapy: Flat affect Overall Cognitive Status: Within Functional Limits for tasks assessed (pt seems to be a poor historian. )             Home Living Family/patient expects to be discharged to:: Skilled nursing facility Living Arrangements: Other relatives Available Help at Discharge: Available 24 hours/day (cousin) Type of Home: House Home Access: Stairs to enter [employer] of Steps: 3 Entrance Stairs-Rails: Right;Left Home Layout: One level     Bathroom Shower/Tub: Tub/shower unit;Door   [employer]: Standard  Home Equipment: [place] - 2 wheels   Prior  Functioning/Environment Level of Independence: Independent with assistive device(s)  Comments: poor historian, but I infer that pt did very little lately    OT Diagnosis: Generalized weakness;Acute pain   OT Problem List: Decreased strength;Decreased range of motion;Decreased activity tolerance;Impaired balance (sitting and/or standing);Decreased safety awareness;Decreased knowledge of use of DME or AE;Pain   OT Treatment/Interventions: Self-care/ADL training;Therapeutic exercise;Energy conservation;DME and/or AE instruction;Therapeutic activities;Patient/family education;Balance training    OT Goals(Current goals can be found in the care plan section) Acute Rehab OT Goals Patient Stated Goal: get better. OT Goal Formulation: With patient Time For Goal Achievement: 08/27/15 Potential to Achieve Goals: Fair ADL Goals Pt Will Perform Grooming: with modified independence;sitting Pt Will Perform Upper Body Bathing: with set-up;sitting Pt Will Perform Lower Body Dressing: sit to/from stand;with mod assist Pt Will Transfer to Toilet: with mod assist;stand pivot transfer;bedside commode Additional ADL Goal #1: Pt will engage in bed mobility with min assist in prep for ADL  OT Frequency: Min 2X/week   Barriers to D/C: Decreased caregiver support          Co-evaluation PT/OT/SLP Co-Evaluation/Treatment: Yes Reason for Co-Treatment: For patient/therapist safety   OT goals addressed during session: ADL's and self-care;Strengthening/ROM      End of Session Activity Tolerance: Patient tolerated treatment well Patient left: in bed;with call bell/phone within reach   Time: 1346-1412 OT Time Calculation (min): 26 min Charges:  OT General Charges $OT Visit: 1 Procedure OT Evaluation $Initial OT Evaluation Tier I: 1 Procedure  Jossalyn Forgione , MS, OTR/L, CLT Pager: [phone]  08/13/2015, 3:35 PM

## 2015-08-13 NOTE — Progress Notes (Signed)
Patient Name: Noah Garner Date of Encounter: 08/13/2015  Active Problems:   AKI (acute kidney injury) ([place])   CHF (congestive heart failure) ([place])   Hypertension   Arthritis   Stasis ulcer ([place])   Eye contusion   Primary Cardiologist: Dr. Donnetta Hutching [facility]) Patient Profile: 54 y.o. male with PMH of morbid obesity, HTN, CHF, and rheumatoid arthritis (recently diagnosed, not on treatment) who presented to [name] on 08/10/2015 for worsening lower back pain, joint pain, and increased weakness. 26 lb weight gain and increased edema over the past 5 months.  SUBJECTIVE: Reports his breathing is "fine". Still having knee pain, worse with movement.  OBJECTIVE Filed Vitals:   08/12/15 1134 08/12/15 2032 08/13/15 0300 08/13/15 0400  BP: 111/67 103/50  111/52  Pulse: 93 92  97  Temp: 99.8 F (37.7 C) 99.2 F (37.3 C)  99.1 F (37.3 C)  TempSrc: Oral Oral  Oral  Resp: 18 19  20   Height:      Weight:   395 lb (179.171 kg)   SpO2: 95% 92%  91%    Intake/Output Summary (Last 24 hours) at 08/13/15 1014 Last data filed at 08/13/15 0926  Gross per 24 hour  Intake   1680 ml  Output   1025 ml  Net    655 ml   Filed Weights   08/11/15 0309 08/12/15 0433 08/13/15 0300  Weight: 406 lb 11.2 oz (184.478 kg) 404 lb 5.2 oz (183.4 kg) 395 lb (179.171 kg)    PHYSICAL EXAM General: Morbidly obese African American  male in no acute distress. Head: Normocephalic, atraumatic.  Neck: Supple without bruits, JVD unable to be assessed secondary to body habitus. Lungs:  Resp regular and unlabored, Distant breath sounds. No audible wheezing. Heart: RRR, S1, S2, no S3, S4, or murmur; no rub. Abdomen: Soft, non-tender, non-distended with normoactive bowel sounds. No hepatomegaly. No rebound/guarding. No obvious abdominal masses. Extremities: No clubbing or cyanosis. 2+ pitting edema bilaterally. Left leg in bandage. Distal pedal pulses are 2+ bilaterally. Neuro: Alert and oriented X 3. Moves all  extremities spontaneously. Psych: Normal affect.  LABS: CBC: Recent Labs  08/10/15 1925 08/11/15 0454 08/12/15 0317  WBC 12.3* 11.8* 13.1*  NEUTROABS 9.4*  --   --   HGB 8.6* 7.7* 7.9*  HCT 26.3* 23.8* 24.5*  MCV 79.9 80.1 79.8  PLT 267 257 261   INR:No results for input(s): INR in the last 72 hours. Basic Metabolic Panel: Recent Labs  08/12/15 0317 08/13/15 0810  NA 139 138  K 4.0 4.1  CL 104 104  CO2 25 26  GLUCOSE 146* 136*  BUN 71* 69*  CREATININE 4.47* 4.55*  CALCIUM 8.7* 8.9   Liver Function Tests: Recent Labs  08/10/15 1925  AST 49*  ALT 28  ALKPHOS 67  BILITOT 0.8  PROT 7.4  ALBUMIN 2.7*   Anemia Panel: Recent Labs  08/12/15 0317  VITAMINB12 1827*  FOLATE 7.4  FERRITIN 604*  TIBC 183*  IRON 15*  RETICCTPCT 2.5    TELE:  NSR with rate in 90's. No atopic events.  ECG: Not yet obtained.  ECHO: Results Pending.  Radiology/Studies: Dg Chest 2 View: 08/12/2015  CLINICAL DATA:  54 year old male with diastolic congestive heart failure, acute on chronic. Initial encounter. EXAM: CHEST  2 VIEW COMPARISON:  Report of [facility] chest radiographs 12/18/2013 (no images available). FINDINGS: Upright AP and lateral views of the chest. There is cardiomegaly. Other mediastinal contours are within normal limits.  No pneumothorax, pulmonary edema, pleural effusion or consolidation. Visualized tracheal air column is within normal limits. No acute osseous abnormality identified. IMPRESSION: Cardiomegaly.  No acute cardiopulmonary abnormality. Electronically Signed   By: Genevie Ann M.D.   On: 08/12/2015 16:51    Current Medications:  . carvedilol  25 mg Oral BID WC  . furosemide  80 mg Intravenous Q12H  . heparin  5,000 Units Subcutaneous 3 times per day  . hydrocerin   Topical Daily  . predniSONE  60 mg Oral QAC breakfast  . sodium chloride  3 mL Intravenous Q12H      ASSESSMENT AND PLAN:  1. Chronic Diastolic CHF - In reviewing Care Everywhere, his  weight was 380 lbs in 03/13/2015. Weight at this current admission was 406 lbs, decreased to 395 lbs on 08/13/2015. However, with only a net output of -248mL, unsure of the accuracy of this. - Since being admitted, he has received 5 doses of Lasix 80mg  IV with a net output of -229mL, however he was inittially placed on IVF at time of admission due to AKI. - new echo is pending. Spoke with [facility] yesterday who said his EF was 50% in 04/2015.  - Continue Lasix and BB. Encouraged fluid restriction (for he had two drinks sitting on his bedside table).  2. Stage 4-5 CKD - creatinine 4.62 at time of admission.  - 4.55 on 08/13/2015. - Spoke with his PCP and Cardiology offices yesterday and they have no old labs available for comparison. - will likely need Nephrology consult at some point this admission.  3. HTN - BP has been 103/50 - 111/67 in the past 24 hours. - continue current medications  4. Chronic Normocytic Anemia - Hgb 7.9 on 08/12/2015. - undergoing workup by admitting team  Signed, Erma Heritage , PA-C 10:14 AM 08/13/2015 Pager: [phone] Patient seen and examined and history reviewed. Agree with above findings and plan. Patient reports swelling about the same. Weight is less but I/O suggest he is not responding much to diuresis. Echo done and results pending. I expect he will need a higher dose of lasix or addition of metolazone to achieve good diuresis. Patient does report a history of sleep apnea by sleep study 5 years ago. States he was unable to afford CPAP. Will need to reevaluate.   Jaidin Richison [place], [place] 08/13/2015 1:09 PM

## 2015-08-13 NOTE — Progress Notes (Signed)
*  PRELIMINARY RESULTS* Echocardiogram 2D Echocardiogram with definity has been performed.  Leavy Cella 08/13/2015, 10:37 AM

## 2015-08-14 ENCOUNTER — Inpatient Hospital Stay (HOSPITAL_COMMUNITY): Payer: Medicare Other

## 2015-08-14 DIAGNOSIS — J9602 Acute respiratory failure with hypercapnia: Secondary | ICD-10-CM

## 2015-08-14 DIAGNOSIS — M549 Dorsalgia, unspecified: Secondary | ICD-10-CM

## 2015-08-14 DIAGNOSIS — J9601 Acute respiratory failure with hypoxia: Secondary | ICD-10-CM | POA: Diagnosis present

## 2015-08-14 DIAGNOSIS — I5023 Acute on chronic systolic (congestive) heart failure: Secondary | ICD-10-CM

## 2015-08-14 DIAGNOSIS — G9341 Metabolic encephalopathy: Secondary | ICD-10-CM | POA: Diagnosis present

## 2015-08-14 DIAGNOSIS — I5043 Acute on chronic combined systolic (congestive) and diastolic (congestive) heart failure: Secondary | ICD-10-CM

## 2015-08-14 DIAGNOSIS — J9600 Acute respiratory failure, unspecified whether with hypoxia or hypercapnia: Secondary | ICD-10-CM

## 2015-08-14 LAB — BLOOD GAS, ARTERIAL
ACID-BASE EXCESS: 0.9 mmol/L (ref 0.0–2.0)
Acid-Base Excess: 0.6 mmol/L (ref 0.0–2.0)
BICARBONATE: 27.1 meq/L — AB (ref 20.0–24.0)
Bicarbonate: 26.5 mEq/L — ABNORMAL HIGH (ref 20.0–24.0)
DRAWN BY: 274071
DRAWN BY: 27407
Delivery systems: POSITIVE
FIO2: 0.21
FIO2: 0.4
O2 SAT: 99.1 %
O2 Saturation: 73.1 %
PATIENT TEMPERATURE: 98.6
PCO2 ART: 61.1 mmHg — AB (ref 35.0–45.0)
PCO2 ART: 57.7 mmHg — AB (ref 35.0–45.0)
PEEP: 8 cmH2O
PH ART: 7.269 — AB (ref 7.350–7.450)
PO2 ART: 175 mmHg — AB (ref 80.0–100.0)
Patient temperature: 98.6
Pressure control: 18 cmH2O
TCO2: 28.9 mmol/L (ref 0–100)
TCO2: 28.3 mmol/L (ref 0–100)
pH, Arterial: 7.285 — ABNORMAL LOW (ref 7.350–7.450)
pO2, Arterial: 46.8 mmHg — ABNORMAL LOW (ref 80.0–100.0)

## 2015-08-14 LAB — POCT I-STAT 3, ART BLOOD GAS (G3+)
ACID-BASE EXCESS: 3 mmol/L — AB (ref 0.0–2.0)
Bicarbonate: 29.9 mEq/L — ABNORMAL HIGH (ref 20.0–24.0)
O2 Saturation: 99 %
PO2 ART: 172 mmHg — AB (ref 80.0–100.0)
TCO2: 32 mmol/L (ref 0–100)
pCO2 arterial: 59.7 mmHg (ref 35.0–45.0)
pH, Arterial: 7.307 — ABNORMAL LOW (ref 7.350–7.450)

## 2015-08-14 LAB — COMPREHENSIVE METABOLIC PANEL
ALK PHOS: 70 U/L (ref 38–126)
ALT: 25 U/L (ref 17–63)
AST: 27 U/L (ref 15–41)
Albumin: 2.3 g/dL — ABNORMAL LOW (ref 3.5–5.0)
Anion gap: 10 (ref 5–15)
BUN: 76 mg/dL — ABNORMAL HIGH (ref 6–20)
CALCIUM: 9.2 mg/dL (ref 8.9–10.3)
CO2: 27 mmol/L (ref 22–32)
CREATININE: 4.52 mg/dL — AB (ref 0.61–1.24)
Chloride: 101 mmol/L (ref 101–111)
GFR, EST AFRICAN AMERICAN: 16 mL/min — AB (ref >60)
GFR, EST NON AFRICAN AMERICAN: 13 mL/min — AB (ref >60)
Glucose, Bld: 126 mg/dL — ABNORMAL HIGH (ref 65–99)
Potassium: 4.4 mmol/L (ref 3.5–5.1)
Sodium: 138 mmol/L (ref 135–145)
TOTAL PROTEIN: 7.7 g/dL (ref 6.5–8.1)
Total Bilirubin: 1 mg/dL (ref 0.3–1.2)

## 2015-08-14 LAB — LACTIC ACID, PLASMA
LACTIC ACID, VENOUS: 0.6 mmol/L (ref 0.5–2.0)
LACTIC ACID, VENOUS: 0.6 mmol/L (ref 0.5–2.0)

## 2015-08-14 LAB — RAPID URINE DRUG SCREEN, HOSP PERFORMED
Amphetamines: NOT DETECTED
Barbiturates: NOT DETECTED
Benzodiazepines: NOT DETECTED
Cocaine: NOT DETECTED
OPIATES: NOT DETECTED
TETRAHYDROCANNABINOL: NOT DETECTED

## 2015-08-14 LAB — CBC WITH DIFFERENTIAL/PLATELET
Basophils Absolute: 0 10*3/uL (ref 0.0–0.1)
Basophils Relative: 0 %
EOS PCT: 0 %
Eosinophils Absolute: 0 10*3/uL (ref 0.0–0.7)
HCT: 23.8 % — ABNORMAL LOW (ref 39.0–52.0)
HEMOGLOBIN: 7.8 g/dL — AB (ref 13.0–17.0)
LYMPHS ABS: 1.9 10*3/uL (ref 0.7–4.0)
LYMPHS PCT: 9 %
MCH: 26 pg (ref 26.0–34.0)
MCHC: 32.8 g/dL (ref 30.0–36.0)
MCV: 79.3 fL (ref 78.0–100.0)
MONOS PCT: 10 %
Monocytes Absolute: 2.2 10*3/uL — ABNORMAL HIGH (ref 0.1–1.0)
NEUTROS PCT: 80 %
Neutro Abs: 16.6 10*3/uL — ABNORMAL HIGH (ref 1.7–7.7)
Platelets: 292 10*3/uL (ref 150–400)
RBC: 3 MIL/uL — AB (ref 4.22–5.81)
RDW: 16.7 % — ABNORMAL HIGH (ref 11.5–15.5)
WBC: 20.7 10*3/uL — AB (ref 4.0–10.5)

## 2015-08-14 LAB — AMMONIA: AMMONIA: 37 umol/L — AB (ref 9–35)

## 2015-08-14 LAB — PROTIME-INR
INR: 1.58 — AB (ref 0.00–1.49)
PROTHROMBIN TIME: 18.9 s — AB (ref 11.6–15.2)

## 2015-08-14 LAB — APTT: aPTT: 34 seconds (ref 24–37)

## 2015-08-14 LAB — MRSA PCR SCREENING: MRSA BY PCR: POSITIVE — AB

## 2015-08-14 LAB — PHOSPHORUS: PHOSPHORUS: 4.4 mg/dL (ref 2.5–4.6)

## 2015-08-14 LAB — PROCALCITONIN
PROCALCITONIN: 2.51 ng/mL
Procalcitonin: 2.46 ng/mL

## 2015-08-14 LAB — SEDIMENTATION RATE

## 2015-08-14 LAB — GLUCOSE, CAPILLARY: GLUCOSE-CAPILLARY: 132 mg/dL — AB (ref 65–99)

## 2015-08-14 MED ORDER — DEXTROSE 5 % IV SOLN
120.0000 mg | Freq: Three times a day (TID) | INTRAVENOUS | Status: DC
  Administered 2015-08-14 – 2015-08-15 (×2): 120 mg via INTRAVENOUS
  Filled 2015-08-14 (×5): qty 12

## 2015-08-14 MED ORDER — HYDRALAZINE HCL 10 MG PO TABS: 10.0000 mg | ORAL_TABLET | Freq: Three times a day (TID) | ORAL | Status: DC

## 2015-08-14 MED ORDER — CHLORHEXIDINE GLUCONATE 0.12 % MT SOLN
15.0000 mL | Freq: Two times a day (BID) | OROMUCOSAL | Status: DC
  Administered 2015-08-14 – 2015-08-20 (×10): 15 mL via OROMUCOSAL
  Filled 2015-08-14 (×9): qty 15

## 2015-08-14 MED ORDER — CHLORHEXIDINE GLUCONATE CLOTH 2 % EX PADS
6.0000 | MEDICATED_PAD | Freq: Every day | CUTANEOUS | Status: AC
  Administered 2015-08-15 – 2015-08-19 (×4): 6 via TOPICAL

## 2015-08-14 MED ORDER — CETYLPYRIDINIUM CHLORIDE 0.05 % MT LIQD
7.0000 mL | Freq: Two times a day (BID) | OROMUCOSAL | Status: DC
  Administered 2015-08-15 – 2015-08-16 (×4): 7 mL via OROMUCOSAL

## 2015-08-14 MED ORDER — METHYLPREDNISOLONE SODIUM SUCC 125 MG IJ SOLR
80.0000 mg | Freq: Three times a day (TID) | INTRAMUSCULAR | Status: DC
  Administered 2015-08-14 – 2015-08-16 (×5): 80 mg via INTRAVENOUS
  Filled 2015-08-14 (×5): qty 2

## 2015-08-14 MED ORDER — MUPIROCIN 2 % EX OINT
1.0000 "application " | TOPICAL_OINTMENT | Freq: Two times a day (BID) | CUTANEOUS | Status: AC
  Administered 2015-08-14 – 2015-08-18 (×9): 1 via NASAL
  Filled 2015-08-14 (×3): qty 22

## 2015-08-14 MED ORDER — PIPERACILLIN-TAZOBACTAM 3.375 G IVPB
3.3750 g | Freq: Three times a day (TID) | INTRAVENOUS | Status: DC
  Administered 2015-08-14: 3.375 g via INTRAVENOUS
  Filled 2015-08-14 (×2): qty 50

## 2015-08-14 MED ORDER — PIPERACILLIN-TAZOBACTAM 3.375 G IVPB
3.3750 g | Freq: Three times a day (TID) | INTRAVENOUS | Status: DC
  Administered 2015-08-14 – 2015-08-16 (×6): 3.375 g via INTRAVENOUS
  Filled 2015-08-14 (×7): qty 50

## 2015-08-14 MED ORDER — ISOSORBIDE MONONITRATE ER 30 MG PO TB24: 15.0000 mg | ORAL_TABLET | Freq: Every day | ORAL | Status: DC

## 2015-08-14 NOTE — Progress Notes (Signed)
Pt having difficulty to staying awake and unable to answer questions during initial assessment. Pt also has had 2 sec pause this AM around 0710. Vitals stable. Dr. Sheran Fava made aware. New orders given.

## 2015-08-14 NOTE — Progress Notes (Signed)
Patient still somnolent per RN, however, following motor commands.  Blood gas is only marginally improved despite several hours of PPV.  Critical care consulted for additional assistance with ventilatory support.

## 2015-08-14 NOTE — Consult Note (Signed)
PULMONARY / CRITICAL CARE MEDICINE   Name: Noah Garner MRN: [id number] DOB: 1960/10/02    ADMISSION DATE:  08/10/2015 CONSULTATION DATE:  08/14/15  REFERRING MD :  Dr. Sheran Fava   CHIEF COMPLAINT:  Hypercarbic Respiratory Failure  HISTORY OF PRESENT ILLNESS:  Information obtained from staff and prior medical documentation as he is altered.    54 y/o M with PMH of HTN, CHF, morbid obesity, OSA and rheumatoid arthritis (recent diagnosis seen at [facility], not yet followed up with Rheumatology or initiated treatment. RF of 19.9 (<13.9) and ANA 1:80, elevated ESR, CRP on 07/16/15 at [facility]) who presented to [facility] on 11/27 with reports of progressive weakness over a 2 month period.   The patient reported on admit that he had been experiencing a progressive decline to the point that he had become bed bound, incontinent of bowel and bladder due to the inability to ambulate to the restroom.  He reported R>L lower extremity weakness and pain in all of his joints.  He denied infectious symptoms on admit.  On the day of admit, he attempted to stand and suffered two mechanical falls - once striking his face on the floor. XRays of T & L Spine were negative for acute findings.  The patient also reported he had noted swelling of his lower extremities and weight gain.  He was admitted per [facility].  Initial labs notable for acute kidney injury with BUN of 80, serum creatinine of 4.62 and anemia with hgb of 7.9.  He was found to have a lower extremity ulcer on admission which he reported had been there for approximately one month.  The patient was felt to be volume overloaded with baseline weight of 172kg and was up to 184 kg on admit.  Cardiology was consulted for evaluation.  Prior ECHO in 04/2015 at [facility] was notable for an LVEF of 50%.  He was treated with IV lasix, beta blocker and fluid restriction.  Repeat ECHO on 11/29 showed severe LV dilation, mild LVH, moderate to severe reduction of systolic dysfunction with an  LVEF of 85-88%, grade 1 diastolic dysfunction.  Nephrology was also consulted in the setting of acute on (suspected) chronic CKD.  Renal ultrasound was evaluated 11/30 which demonstrated mild cortical thinning in both kidneys, no hydronephrosis. The patient developed progressive lethargy / hypoxia and an ABG was evaluated - 7.269 / 61 / 46 / 27.  The patient was treated with NIPPV and an ABG was repeated several hours later that did not reflect significant change.  PCCM was consulted for evaluation of acute respiratory failure.    PAST MEDICAL HISTORY :  He  has a past medical history of CHF (congestive heart failure) ([place]); Hypertension; and Arthritis.  PAST SURGICAL HISTORY: He  has no past surgical history on file.  No Known Allergies  No current facility-administered medications on file prior to encounter.   No current outpatient prescriptions on file prior to encounter.    FAMILY HISTORY:  His reported the following about his father: Unknown History.   SOCIAL HISTORY: He  reports that he quit smoking about 20 years ago. He does not have any smokeless tobacco history on file. He reports that he does not drink alcohol or use illicit drugs.  REVIEW OF SYSTEMS:   Unable to complete as patient is requiring bipap / altered.   SUBJECTIVE: responding on NIMV  VITAL SIGNS: BP 131/90 mmHg  Pulse 73  Temp(Src) 97.5 F (36.4 C) (Oral)  Resp 16  Ht _0  (1.803 m)  Wt 395 lb (179.171 kg)  BMI 55.12 kg/m2  SpO2 100%  HEMODYNAMICS:    VENTILATOR SETTINGS: Vent Mode:  [-] BIPAP;PCV FiO2 (%):  [40 %] 40 %  INTAKE / OUTPUT: I/O last 3 completed shifts: In: 1480 [P.O.:1480] Out: 1525 [Urine:1525]  PHYSICAL EXAMINATION: General:  Obese male in NAD on BiPAP Neuro:  Arouses to voice, follows commands, drowsy but interacts and MAE, indicates his RLE is hurting, adjusted in bed with improvement.   HEENT:  MM pink/moist, unable to assess JVD Cardiovascular:  s1s2 distant, regular   Lungs:  resp's even/non-labored, lungs bilaterally diminished but clear  Abdomen:  Obese/soft, bsx4 active  Musculoskeletal:  No acute deformities Skin:  Warm/dry, BLE chronic venous stasis, LLE wrapped, edema +1-2  LABS:  CBC  Recent Labs Lab 08/11/15 0454 08/12/15 0317 08/14/15 0440  WBC 11.8* 13.1* 20.7*  HGB 7.7* 7.9* 7.8*  HCT 23.8* 24.5* 23.8*  PLT 257 261 292   Coag's  Recent Labs Lab 08/14/15 1101  APTT 34  INR 1.58*   BMET  Recent Labs Lab 08/12/15 0317 08/13/15 0810 08/14/15 0440  NA 139 138 138  K 4.0 4.1 4.4  CL 104 104 101  CO2 _1 BUN 71* 69* 76*  CREATININE 4.47* 4.55* 4.52*  GLUCOSE 146* 136* 126*   Electrolytes  Recent Labs Lab 08/12/15 0317 08/13/15 0810 08/14/15 0440  CALCIUM 8.7* 8.9 9.2  PHOS  --   --  4.4   Sepsis Markers  Recent Labs Lab 08/14/15 1101 08/14/15 1230  LATICACIDVEN 0.6 0.6  PROCALCITON 2.46  --    ABG  Recent Labs Lab 08/14/15 0940 08/14/15 1420  PHART 7.269* 7.285*  PCO2ART 61.1* 57.7*  PO2ART 46.8* 175*   Liver Enzymes  Recent Labs Lab 08/10/15 1925 08/14/15 0440  AST 49* 27  ALT 28 25  ALKPHOS 67 70  BILITOT 0.8 1.0  ALBUMIN 2.7* 2.3*   Cardiac Enzymes No results for input(s): TROPONINI, PROBNP in the last 168 hours.   Glucose  Recent Labs Lab 08/14/15 1005  GLUCAP 132*    Imaging US Renal  08/14/2015  CLINICAL DATA:  Chronic kidney disease and hypertension. EXAM: RENAL / URINARY TRACT ULTRASOUND COMPLETE COMPARISON:  None. FINDINGS: Right Kidney: Length: 11.9 cm.  Mild cortical thinning without hydronephrosis. Left Kidney: Length: 11.5 cm.  Mild cortical thinning without hydronephrosis. Bladder: Appears normal for degree of bladder distention. IMPRESSION: Mild cortical thinning in both kidneys. No hydronephrosis. Electronically Signed   By: Markus Daft M.D.   On: 08/14/2015 07:40   Dg Chest Port 1 View  08/14/2015  CLINICAL DATA:  Hypoxia.  Hypertension EXAM: PORTABLE  CHEST 1 VIEW COMPARISON:  August 12, 2015 FINDINGS: There is no edema or consolidation. There is cardiomegaly. The pulmonary vascularity is normal. No adenopathy. No bone lesions. IMPRESSION: Stable cardiac enlargement.  No edema or consolidation. Electronically Signed   By: Lowella Grip III M.D.   On: 08/14/2015 11:08     STUDIES:  11/29  ECHO >> severe LV dilation, mild LVH, moderate to severe reduction of systolic dysfunction with an LVEF of 92-11%, grade 1 diastolic dysfunction 94/17  Renal US >> mild cortical thinning in both kidneys, no hydronephrosis.    CULTURES: 11/30  MRSA PCR >> positive  11/30  BCx2 >>   ANTIBIOTICS: Zosyn 11/30 >>   SIGNIFICANT EVENTS: 11/26  Admit with weakness, swelling, falls.  Found to have AKI, anemia, volume overload.  LINES/TUBES:  DISCUSSION: 54 y/o M with PMH of HTN, CHF and new recent diagnosis of rheumatoid arthritis who presented to [facility] on 11/26 with 2 month history of progressive weakness, joint pain, falls, weight gain and incontinence.  The patient was found to have anemia, AKI, acute on chronic CHF exacerbation.  He later (11/30) developed hypercarbic / hypoxic respiratory failure requiring bipap.    ASSESSMENT / PLAN:  PULMONARY A: Acute hypercarbic / hypoxic respiratory failure - in setting of AKI, decompensated CHF, +/- autoimmune process with weakness, untreated OSA  OSA  P:   Monitor respiratory status closely, may require intubation  Trend CXR  Diuresis as renal function / BP permit  Mandatory BiPAP QHS & PRN sleep  Avoid sedating medications  Oxygen as needed to support sats > 92% Will likely need repeat sleep study at discharge / home bipap set up   CARDIOVASCULAR A:  Acute on Chronic Systolic CHF  Uncontrolled HTN  P:  Hemodynamic monitoring in ICU  Continue Imdur, hydralazine & lasix  Cardiology following  RENAL A:   Acute on Chronic Kidney Disease - previously seen by Nephrology in [place], unknown baseline  sr cr   P:   Trend BMP / [place]  Nephrology following  Replace electrolytes as indicated  GASTROINTESTINAL A:   Morbid Obesity - BMI 56 P:   NPO while on BiPAP   HEMATOLOGIC A:   Anemia - suspect of renal disease, r/o blood loss anemia, B12 and folate wnl P:  FOBT stool  Trend CBC  Heparin for DVT prophylaxis   INFECTIOUS A:   Leukocytosis  LLE Ulcer - present on admission P:   Monitor CBC / Fever Curve Wound care per [facility]  Empiric abx initiated 11/30, see above  Follow cultures   ENDOCRINE A:   Migrating Arthritis - ? Rheumatoid Arthritis - prior positive labs RF of 19.9 (<13.9) and ANA 1:80, elevated ESR, CRP on 07/16/15 at [facility]). P:   TRH started prednisone 60 mg QD with improvement in symptoms Follow up autoimmune panel  Will need Rheumatology follow up at discharge   NEUROLOGIC A:   Metabolic Encephalopathy - in setting of AKI, hypercarbic / hypoxic respiratory failure.  Exam non-toxic. Mechanical Fall - prior to admit, T&L Spine XRray negative  Weakness / Failure to Thrive  P:   RASS goal: 0 Avoid sedating medications as able PT efforts   FAMILY  - Updates:  No family at bedside.  - Inter-disciplinary family meet or Palliative Care meeting due by:  12/8     Noe Gens, NP-C [facility] Pgr: [phone] or if no answer [phone] 08/14/2015, 3:30 PM    STAFF NOTE: I, Merrie Roof, MD FACP have personally reviewed patient's available data, including medical history, events of note, physical examination and test results as part of my evaluation. I have discussed with resident/NP and other care providers such as pharmacist, RN and RRT. In addition, I personally evaluated patient and elicited key findings of: awakens on nimv, no increase WOB, pcxr c/w failure, does NOT require intubation at this stage, likley this is heart failure related edema, but with additional curent issues , concern also there for pneumonitis or  other rheum related process, too unstable for chest CT, but would favor this if not improved with lasix, escalate empiric steroids for now, assess c3, c4, assess for pulm renal syndrome anca, etc, hope for neg balance, increase PC on nimv and reduce cpap, continue guarnteed rate, repeat abg The  patient is critically ill with multiple organ systems failure and requires high complexity decision making for assessment and support, frequent evaluation and titration of therapies, application of advanced monitoring technologies and extensive interpretation of multiple databases.   Critical Care Time devoted to patient care services described in this note is 30  Minutes. This time reflects time of care of this signee: Merrie Roof, MD FACP. This critical care time does not reflect procedure time, or teaching time or supervisory time of PA/NP/Med student/Med Resident etc but could involve care discussion time. Rest per NP/medical resident whose note is outlined above and that I agree with   Lavon Paganini. Titus Mould, MD, [place] Pgr: [facility] 08/14/2015 7:53 PM

## 2015-08-14 NOTE — Progress Notes (Signed)
Assessment:  1 Chronic CKD, suspected stage 5 with possible uremia 2 Volume overload 3 Anemia, iron deficient and ESA deficient-Rx'd 4 LE wounds 5 Morbid obesity 6  Pos Rheumatoid factor 7 Hypertension 8 Hypercarbic resp insuff  Plan: 1 Diurese but reduce dose  Subjective: Interval History: Respiratory insufficiency needing pos pres support  Objective: Vital signs in last 24 hours: Temp:  [97.5 F (36.4 Garner)-99.6 F (37.6 Garner)] 97.5 F (36.4 Garner) (11/30 1215) Pulse Rate:  [75-96] 78 (11/30 1300) Resp:  [12-23] 17 (11/30 1300) BP: (98-135)/(47-76) 121/75 mmHg (11/30 1300) SpO2:  [57 %-100 %] 96 % (11/30 1300) FiO2 (%):  [40 %] 40 % (11/30 1256) Weight change:   Intake/Output from previous day: 11/29 0701 - 11/30 0700 In: 1240 [P.O.:1240] Out: 1200 [Urine:1200] Intake/Output this shift: Total I/O In: 198 [IV Piggyback:198] Out: -   General appearance: slowed mentation Head: Normocephalic, without obvious abnormality, atraumatic, bipap GI: soft, non-tender; bowel sounds normal; no masses,  no organomegaly and protuberant Extremities: edema 3+  Lab Results:  Recent Labs  08/12/15 0317 08/14/15 0440  WBC 13.1* 20.7*  HGB 7.9* 7.8*  HCT 24.5* 23.8*  PLT 261 292   BMET:  Recent Labs  08/13/15 0810 08/14/15 0440  NA 138 138  K 4.1 4.4  CL 104 101  CO2 26 27  GLUCOSE 136* 126*  BUN 69* 76*  CREATININE 4.55* 4.52*  CALCIUM 8.9 9.2   No results for input(s): PTH in the last 72 hours. Iron Studies:  Recent Labs  08/12/15 0317  IRON 15*  TIBC 183*  FERRITIN 604*   Studies/Results: Dg Chest 2 View  08/12/2015  CLINICAL DATA:  54 year old male with diastolic congestive heart failure, acute on chronic. Initial encounter. EXAM: CHEST  2 VIEW COMPARISON:  Report of [facility] chest radiographs 12/18/2013 (no images available). FINDINGS: Upright AP and lateral views of the chest. There is cardiomegaly. Other mediastinal contours are within normal  limits. No pneumothorax, pulmonary edema, pleural effusion or consolidation. Visualized tracheal air column is within normal limits. No acute osseous abnormality identified. IMPRESSION: Cardiomegaly.  No acute cardiopulmonary abnormality. Electronically Signed   By: Genevie Ann M.D.   On: 08/12/2015 16:51   US Renal  08/14/2015  CLINICAL DATA:  Chronic kidney disease and hypertension. EXAM: RENAL / URINARY TRACT ULTRASOUND COMPLETE COMPARISON:  None. FINDINGS: Right Kidney: Length: 11.9 cm.  Mild cortical thinning without hydronephrosis. Left Kidney: Length: 11.5 cm.  Mild cortical thinning without hydronephrosis. Bladder: Appears normal for degree of bladder distention. IMPRESSION: Mild cortical thinning in both kidneys. No hydronephrosis. Electronically Signed   By: Markus Daft M.D.   On: 08/14/2015 07:40   Dg Chest Port 1 View  08/14/2015  CLINICAL DATA:  Hypoxia.  Hypertension EXAM: PORTABLE CHEST 1 VIEW COMPARISON:  August 12, 2015 FINDINGS: There is no edema or consolidation. There is cardiomegaly. The pulmonary vascularity is normal. No adenopathy. No bone lesions. IMPRESSION: Stable cardiac enlargement.  No edema or consolidation. Electronically Signed   By: Lowella Grip III M.D.   On: 08/14/2015 11:08    Scheduled: . antiseptic oral rinse  7 mL Mouth Rinse q12n4p  . chlorhexidine  15 mL Mouth Rinse BID  . [START ON 08/15/2015] Chlorhexidine Gluconate Cloth  6 each Topical Q0600  . furosemide  160 mg Intravenous Q6H  . heparin  5,000 Units Subcutaneous 3 times per day  . hydrocerin   Topical Daily  . mupirocin ointment  1 application Nasal BID  . piperacillin-tazobactam (  ZOSYN)  IV  3.375 g Intravenous Q8H  . predniSONE  60 mg Oral QAC breakfast  . sodium chloride  3 mL Intravenous Q12H        LOS: 3 days   Noah Garner 08/14/2015,2:16 PM

## 2015-08-14 NOTE — Progress Notes (Signed)
Pt changed to NIV/PC mode due to prolonged periods of apnea.  RT will continue to monitor.

## 2015-08-14 NOTE — Progress Notes (Signed)
Patient Name: Noah Garner Date of Encounter: 08/14/2015  Active Problems:   AKI (acute kidney injury) ([place])   CHF (congestive heart failure) ([place])   Hypertension   Arthritis   Stasis ulcer ([place])   Eye contusion   Primary Cardiologist: Dr. Donnetta Hutching [facility]) Patient Profile: 54 y.o. male with PMH of morbid obesity, HTN, CHF, and rheumatoid arthritis (recently diagnosed, not on treatment) who presented to [name] on 08/10/2015 for worsening lower back pain, joint pain, and increased weakness. 26 lb weight gain and increased edema over the past 5 months.  SUBJECTIVE: Keeps falling asleep throughout encounter. Denies any chest pain, palpitations, or shortness of breath.  OBJECTIVE Filed Vitals:   08/13/15 0400 08/13/15 1141 08/13/15 2116 08/14/15 0533  BP: 111/52 124/64 135/76 107/72  Pulse: 97 92 92 96  Temp: 99.1 F (37.3 C) 99.4 F (37.4 C) 99.6 F (37.6 C) 98.6 F (37 C)  TempSrc: Oral Oral Oral Oral  Resp: 20 18 20 23   Height:      Weight:      SpO2: 91% 90% 100% 100%    Intake/Output Summary (Last 24 hours) at 08/14/15 0742 Last data filed at 08/14/15 0540  Gross per 24 hour  Intake   1000 ml  Output   1200 ml  Net   -200 ml   Filed Weights   08/11/15 0309 08/12/15 0433 08/13/15 0300  Weight: 406 lb 11.2 oz (184.478 kg) 404 lb 5.2 oz (183.4 kg) 395 lb (179.171 kg)    PHYSICAL EXAM General: Morbidly obese male in no acute distress. Head: Normocephalic, atraumatic.  Neck: Supple without bruits, JVD unable to be assessed secondary to body habitus. Lungs:  Resp regular and unlabored, CTA without wheezing or rales. Heart: RRR, S1, S2, no S3, S4, or murmur; no rub. Abdomen: Soft, non-tender, non-distended with normoactive bowel sounds. No hepatomegaly. No rebound/guarding. No obvious abdominal masses. Extremities: No clubbing. 2+ pitting edema bilaterally. Distal pedal pulses are 2+ bilaterally. Neuro: Alert and oriented X 3. Moves all extremities  spontaneously. Psych: Normal affect.  LABS: CBC: Recent Labs  08/12/15 0317 08/14/15 0440  WBC 13.1* 20.7*  NEUTROABS  --  16.6*  HGB 7.9* 7.8*  HCT 24.5* 23.8*  MCV 79.8 79.3  PLT 261 [id number]   Basic Metabolic Panel: Recent Labs  08/13/15 0810 08/14/15 0440  NA 138 138  K 4.1 4.4  CL 104 101  CO2 26 27  GLUCOSE 136* 126*  BUN 69* 76*  CREATININE 4.55* 4.52*  CALCIUM 8.9 9.2  PHOS  --  4.4   Liver Function Tests: Recent Labs  08/14/15 0440  AST 27  ALT 25  ALKPHOS 70  BILITOT 1.0  PROT 7.7  ALBUMIN 2.3*   Anemia Panel: Recent Labs  08/12/15 0317  VITAMINB12 1827*  FOLATE 7.4  FERRITIN 604*  TIBC 183*  IRON 15*  RETICCTPCT 2.5    TELE:   NSR with rate in 70's - 90's. Infrequent 2 second pauses noted.      ECHO: Study Conclusions - Left ventricle: The cavity size was severely dilated. Wall thickness was increased in a pattern of mild LVH. Systolic function was moderately to severely reduced. The estimated ejection fraction was in the range of 30% to 35%. Doppler parameters are consistent with abnormal left ventricular relaxation (grade 1 diastolic dysfunction). - Impressions: Extremely limited; definity used but LV function still difficult to quantitate; moderate to severe global reduction in LV function; grade 1 diastolic dysfunction; severe LVE;  mild LVH. Suggest cardiac MRI or MUGA to better assess LV function.  Impressions: - Extremely limited; definity used but LV function still difficult to quantitate; moderate to severe global reduction in LV function; grade 1 diastolic dysfunction; severe LVE; mild LVH. Suggest cardiac MRI or MUGA to better assess LV function.  Radiology/Studies: Dg Chest 2 View: 08/12/2015  CLINICAL DATA:  54 year old male with diastolic congestive heart failure, acute on chronic. Initial encounter. EXAM: CHEST  2 VIEW COMPARISON:  Report of [facility] chest radiographs 12/18/2013 (no images available). FINDINGS:  Upright AP and lateral views of the chest. There is cardiomegaly. Other mediastinal contours are within normal limits. No pneumothorax, pulmonary edema, pleural effusion or consolidation. Visualized tracheal air column is within normal limits. No acute osseous abnormality identified. IMPRESSION: Cardiomegaly.  No acute cardiopulmonary abnormality. Electronically Signed   By: Genevie Ann M.D.   On: 08/12/2015 16:51     Current Medications:  . carvedilol  12.5 mg Oral BID WC  . darbepoetin (ARANESP) injection - DIALYSIS  200 mcg Subcutaneous Once  . furosemide  160 mg Intravenous Q6H  . heparin  5,000 Units Subcutaneous 3 times per day  . hydrocerin   Topical Daily  . predniSONE  60 mg Oral QAC breakfast  . sodium chloride  3 mL Intravenous Q12H      ASSESSMENT AND PLAN:  1. Chronic Diastolic CHF - In reviewing Care Everywhere, his weight was 380 lbs in 03/13/2015. Weight at this current admission was 406 lbs, decreased to 395 lbs on 08/13/2015. However, with only a net output of -256mL, unsure of the accuracy of this. - Since being admitted, he is only -619mL. - EF was 50% in 04/2015 per [facility]. EF now 30-35% by echo on 08/13/2015. However it was a very limited study even with the use of image enhancement. According to records from [place], his EF was 25-30% in 03/2011 with global hypokinesis. Had cath in 2013 with no significant CAD. Consider need for cardiac MRI for further evaluation. (Weight limit for MRI?). - Continue Lasix and BB. Now receiving Lasix 160mg  IV Q6H per Nephrology.  2. Stage 4-5 CKD - creatinine 4.62 at time of admission.  - 4.52 on 08/14/2015. - Spoke with his PCP and Cardiology offices yesterday and they have no old labs available for comparison. - Nephrology has been consulted. Renal US obtained showing mild cortical thinning in both kidneys and no hydronephrosis. Recommended continuing diuresis and giving EPO and iron.  3. HTN - BP has been 107/64 - 135/76 in  the past 24 hours. - continue current medications  4. Chronic Normocytic Anemia - Hgb 7.8 on 08/14/2015. - undergoing workup by admitting team  5. OSA - reported being unable to afford CPAP when diagnosed 5+ years ago. Notes from [place] show he was offered a machine but declined. - will need repeat study as outpatient.  6.Sinus Pauses - up to 2 seconds. - Carvedilol recently decreased to 12.5mg  BID today. Would keep at this low-dose for now.   Noah Garner , PA-C 7:42 AM 08/14/2015 Pager: [phone] Patient seen and examined and history reviewed. Agree with above findings and plan. Events of today reviewed. Increased somnolence and respiratory depression. Now in unit on BIPAP. Still difficult to arouse. In NSR. Farily poor response to diuretics so far. Echo reviewed by me personally. Difficult study due to body habitus. I think EF of 30-35% is a good ballpark estimate of LV function. He exceeds weight limit for cardiac MRI.  Nuclear/MUGA assessment would also be very limited due to morbid obesity and poor resolution. Continue IV diuresis per nephrology. BIPAP support. CCM to see. Continue same Coreg dose. Not a candidate for ARB/ACEi. Consider addition of hydralazine/ nitrates as BP tolerates.   Fadil Macmaster [place], [place] 08/14/2015 3:26 PM

## 2015-08-14 NOTE — Progress Notes (Signed)
ANTIBIOTIC CONSULT NOTE - INITIAL  Pharmacy Consult for Zosyn Indication: pneumonia  No Known Allergies  Patient Measurements: Height: 5\' 11"  (180.3 cm) Weight: (!) 395 lb (179.171 kg) IBW/kg (Calculated) : 75.3  Vital Signs: Temp: 98.6 F (37 C) (11/30 0533) Temp Source: Oral (11/30 0533) BP: 98/63 mmHg (11/30 0956) Pulse Rate: 80 (11/30 0956) Intake/Output from previous day: 11/29 0701 - 11/30 0700 In: 1240 [P.O.:1240] Out: 1200 [Urine:1200] Intake/Output from this shift:    Labs:  Recent Labs  08/12/15 0317 08/13/15 0810 08/14/15 0440  WBC 13.1*  --  20.7*  HGB 7.9*  --  7.8*  PLT 261  --  292  CREATININE 4.47* 4.55* 4.52*   Estimated Creatinine Clearance: 30.9 mL/min (by C-G formula based on Cr of 4.52). No results for input(s): VANCOTROUGH, VANCOPEAK, VANCORANDOM, GENTTROUGH, GENTPEAK, GENTRANDOM, TOBRATROUGH, TOBRAPEAK, TOBRARND, AMIKACINPEAK, AMIKACINTROU, AMIKACIN in the last 72 hours.   Microbiology: No results found for this or any previous visit (from the past 720 hour(s)).  Medical History: Past Medical History  Diagnosis Date  . CHF (congestive heart failure) ([place])   . Hypertension   . Arthritis     Assessment: 54 year old male s/p fall at home with history of CHF now with acute metabolic encephalopathy due to hypoxic/hpyercapneic respiratory failure, ? Due to pneumonia or untreated OSA Pharmacy to begin Zosyn  Goal of Therapy:  Appropriate dosing  Plan:  Zosyn 3.375 grams iv Q 8 hours (7 day stop date placed) Pharmacy to sign off and follow peripherally  Thank you Anette Guarneri, PharmD [phone]   08/14/2015,11:09 AM

## 2015-08-14 NOTE — Progress Notes (Signed)
CRITICAL VALUE ALERT  Critical value received:  ABG  Date of notification:  .TD  Time of notification:  1440   Critical value read back:YES  Nurse who received alert:  Etta Quill  MD notified (1st page):  SHORT  Time of first page:  1442  MD notified (2nd page):  Time of second page:  Responding MD:   Time MD responded: NO RESPONSE NEEDED, TEXT PAGED VIA AMION

## 2015-08-14 NOTE — Progress Notes (Signed)
ABG results called to Dr. Emmit Alexanders, [place].

## 2015-08-14 NOTE — Progress Notes (Signed)
Pt placed on BIPAP per MD order due to ABG results.  Will recheck ABG in one hour.

## 2015-08-14 NOTE — Progress Notes (Signed)
[facility] PROGRESS NOTE  Noah Garner [id number] DOB: 24-Nov-1960 DOA: 08/10/2015 PCP: Nilda Simmer, MD  Brief Summary  54 y/o male with history of CHF, hypertension, arthritis who was admitted to [facility] 11.26.16 with recurrent falls at home, inability to ambulate, lower back and right hip pain, bilateral lower extremity edema and incontinence.  Has intermittent LE edema for about 2-3 yrs.  He has recently moved back from [place], Dr. Ilene Qua he has had a lot of his care at [facility].  Admission labs revealing BUN/creatinine 80/4.6, also found to have left lower extremity ulcer and wound care consulted.    -MRI T and L-spine negative for acute findings  Assessment/Plan  Acute metabolic encephalopathy due to hypercapneic respiratory failure -  ABG concerning for acute hypercapnea -  Found to be hypoxic to mid-50s and nonrebreather placed -  Starting to wake up a little spontaneously -  No sedating medications in last 36-48 hours -  Ammonia level only minimally elevated and not likely responsible for mental status changes  Acute hypercapneic and hypoxic respiratory failure probably due to untreated OSA, possible superimposed pneumonia given elevated WBC, and acute on chronic systolic heart failure -  CXR stat -  At risk for aspiration given near obtundation this morning -  Bipap continuous -  Repeat ABG in 1 hour -  Transfer to stepdown  Acute on chronic systolic heart failure -  echo demonstrated moderatelyt to severely reduced EF 30-35%, grade 1 DD, severe LVE and mild LVH -  Lasix per cardiology  Possible sepsis with tachypnea and leukocytosis.  At risk for aspiration.  Leukocytosis is probably due to steroids  -  Check lactic acid -  Blood cultures -  Start empiric zosyn -  Check UA  Fall and weakness -unclear etiology, imaging studies MRI x-rays done on admission were negative for acute finding.  - PT OT recommending SNF  Left  lower extremity full-thickness ulcer 1.4 x 4 x 0.4 centimeters -wound care has seen him and recommended calcium alginate daily dressings and then subsequent 3 times a week -may need debridement of the area.  OA on R knee Initially asked ortho to come by and look at his Knee for ? Injection Would probably hold off on injeciton at this juncture as patient has migrating polyarthropathy-see below  Doubt rheumatoid arthritis since RF minimally elevated and anti-CCP negative -  started prednisone 60 mgdaily as he has migrating arthritis which has helped  Chronic kidney disease stage IV to 5 Baseline creatinine not sure His baseline weight at [facility] was 172 kilograms and he is currently 179 cc  appreciate nephrology assistance - RUS with mild cortical thinning of both kidneys  Body mass index is 56.75 kg/(m^2). -  Life-threatening  Uncontrolled hypertension -  Cannot use ACE inhibitor -  Hold parameter for low BP for Coreg  Anemia probably of renal disease vs blood loss anemia -  Iron deficiency vs. Chronic disease -  hemoccult stool -  Given feraheme and arenesp on 11/30 -  B12 and folate wnl   Diet:  CLD and advance as tolerated Access:  PIV IVF:  off Proph:  heparin  Code Status: full Family Communication: patient alone  Disposition Plan: transfer to stepdown.  Eventual discharge to SNF when respiratory status and mental status and volume/kidney function have stabilized   Consultants:  CArdiology  Nephrology  Procedures:  ECHO  Antibiotics:  Zosyn 11/30    HPI/Subjective:  Unable to answer questions initially.  Found to  be less responsive this morning.  CBG stable, but O2 sat in 50s,  Placed on NRB.  ABG concerning for hypercapnea and hypoxia.  Started to follow some commands after 10 minutes on NRB.    Objective: Filed Vitals:   08/14/15 0809 08/14/15 0949 08/14/15 0956 08/14/15 0958  BP: 134/47  98/63   Pulse: 82  80   Temp:      TempSrc:      Resp:       Height:      Weight:      SpO2:  57% 100% 100%    Intake/Output Summary (Last 24 hours) at 08/14/15 1004 Last data filed at 08/14/15 0540  Gross per 24 hour  Intake    760 ml  Output   1100 ml  Net   -340 ml   Filed Weights   08/11/15 0309 08/12/15 0433 08/13/15 0300  Weight: 184.478 kg (406 lb 11.2 oz) 183.4 kg (404 lb 5.2 oz) 179.171 kg (395 lb)   Body mass index is 55.12 kg/(m^2).  Exam:   General:  Morbidly obese male, 10 second pauses in breathing, snoring noises  HEENT:  NCAT, MMM  Cardiovascular:  Distant heart sounds:  Telemetry:  Sinus brady to 40s with 2 sec pauses  Respiratory:  Distant anterior breath sounds  Abdomen:   NABS, soft, NT/ND  MSK:   Normal tone and bulk, 3+ slow pitting bilateral LEE  Neuro:  arousable to painful stimuli briefly but then falls asleep again before able to answer questions  Data Reviewed: Basic Metabolic Panel:  Recent Labs Lab 08/10/15 1925 08/11/15 0454 08/12/15 0317 08/13/15 0810 08/14/15 0440  NA 138 139 139 138 138  K 4.8 4.2 4.0 4.1 4.4  CL 102 105 104 104 101  CO2 25 25 25 26 27   GLUCOSE 78 99 146* 136* 126*  BUN 80* 76* 71* 69* 76*  CREATININE 4.62* 4.51* 4.47* 4.55* 4.52*  CALCIUM 9.0 8.9 8.7* 8.9 9.2  PHOS  --   --   --   --  4.4   Liver Function Tests:  Recent Labs Lab 08/10/15 1925 08/14/15 0440  AST 49* 27  ALT 28 25  ALKPHOS 67 70  BILITOT 0.8 1.0  PROT 7.4 7.7  ALBUMIN 2.7* 2.3*   No results for input(s): LIPASE, AMYLASE in the last 168 hours.  Recent Labs Lab 08/14/15 0440  AMMONIA 37*   CBC:  Recent Labs Lab 08/10/15 1925 08/11/15 0454 08/12/15 0317 08/14/15 0440  WBC 12.3* 11.8* 13.1* 20.7*  NEUTROABS 9.4*  --   --  16.6*  HGB 8.6* 7.7* 7.9* 7.8*  HCT 26.3* 23.8* 24.5* 23.8*  MCV 79.9 80.1 79.8 79.3  PLT 267 257 261 292    No results found for this or any previous visit (from the past 240 hour(s)).   Studies: Dg Chest 2 View  08/12/2015  CLINICAL DATA:   54 year old male with diastolic congestive heart failure, acute on chronic. Initial encounter. EXAM: CHEST  2 VIEW COMPARISON:  Report of [facility] chest radiographs 12/18/2013 (no images available). FINDINGS: Upright AP and lateral views of the chest. There is cardiomegaly. Other mediastinal contours are within normal limits. No pneumothorax, pulmonary edema, pleural effusion or consolidation. Visualized tracheal air column is within normal limits. No acute osseous abnormality identified. IMPRESSION: Cardiomegaly.  No acute cardiopulmonary abnormality. Electronically Signed   By: Genevie Ann M.D.   On: 08/12/2015 16:51   US Renal  08/14/2015  CLINICAL DATA:  Chronic kidney  disease and hypertension. EXAM: RENAL / URINARY TRACT ULTRASOUND COMPLETE COMPARISON:  None. FINDINGS: Right Kidney: Length: 11.9 cm.  Mild cortical thinning without hydronephrosis. Left Kidney: Length: 11.5 cm.  Mild cortical thinning without hydronephrosis. Bladder: Appears normal for degree of bladder distention. IMPRESSION: Mild cortical thinning in both kidneys. No hydronephrosis. Electronically Signed   By: Markus Daft M.D.   On: 08/14/2015 07:40    Scheduled Meds: . furosemide  160 mg Intravenous Q6H  . heparin  5,000 Units Subcutaneous 3 times per day  . hydrocerin   Topical Daily  . predniSONE  60 mg Oral QAC breakfast  . sodium chloride  3 mL Intravenous Q12H   Continuous Infusions:   Active Problems:   AKI (acute kidney injury) ([place])   CHF (congestive heart failure) ([place])   Hypertension   Arthritis   Stasis ulcer ([place])   Eye contusion    Time spent: 30 min    Coda Filler, [place] [facility] Pager [phone]. If 7PM-7AM, please contact night-coverage at [email or web address], password [facility] 08/14/2015, 10:04 AM  LOS: 3 days

## 2015-08-15 ENCOUNTER — Inpatient Hospital Stay (HOSPITAL_COMMUNITY): Payer: Medicare Other

## 2015-08-15 DIAGNOSIS — J9601 Acute respiratory failure with hypoxia: Secondary | ICD-10-CM

## 2015-08-15 DIAGNOSIS — I428 Other cardiomyopathies: Secondary | ICD-10-CM

## 2015-08-15 DIAGNOSIS — R5381 Other malaise: Secondary | ICD-10-CM | POA: Diagnosis present

## 2015-08-15 DIAGNOSIS — J9602 Acute respiratory failure with hypercapnia: Secondary | ICD-10-CM

## 2015-08-15 DIAGNOSIS — J9600 Acute respiratory failure, unspecified whether with hypoxia or hypercapnia: Secondary | ICD-10-CM | POA: Insufficient documentation

## 2015-08-15 DIAGNOSIS — I429 Cardiomyopathy, unspecified: Secondary | ICD-10-CM

## 2015-08-15 DIAGNOSIS — E662 Morbid (severe) obesity with alveolar hypoventilation: Secondary | ICD-10-CM | POA: Diagnosis present

## 2015-08-15 LAB — CBC WITH DIFFERENTIAL/PLATELET
Basophils Absolute: 0 10*3/uL (ref 0.0–0.1)
Basophils Relative: 0 %
EOS PCT: 0 %
Eosinophils Absolute: 0 10*3/uL (ref 0.0–0.7)
HEMATOCRIT: 25.7 % — AB (ref 39.0–52.0)
HEMOGLOBIN: 8.1 g/dL — AB (ref 13.0–17.0)
LYMPHS ABS: 1.4 10*3/uL (ref 0.7–4.0)
LYMPHS PCT: 9 %
MCH: 25.9 pg — AB (ref 26.0–34.0)
MCHC: 31.5 g/dL (ref 30.0–36.0)
MCV: 82.1 fL (ref 78.0–100.0)
Monocytes Absolute: 0.6 10*3/uL (ref 0.1–1.0)
Monocytes Relative: 4 %
NEUTROS ABS: 13.1 10*3/uL — AB (ref 1.7–7.7)
Neutrophils Relative %: 87 %
PLATELETS: 330 10*3/uL (ref 150–400)
RBC: 3.13 MIL/uL — AB (ref 4.22–5.81)
RDW: 16.8 % — ABNORMAL HIGH (ref 11.5–15.5)
WBC: 15.2 10*3/uL — AB (ref 4.0–10.5)

## 2015-08-15 LAB — MAGNESIUM: MAGNESIUM: 2.2 mg/dL (ref 1.7–2.4)

## 2015-08-15 LAB — CBC
HCT: 26.5 % — ABNORMAL LOW (ref 39.0–52.0)
HEMOGLOBIN: 8.3 g/dL — AB (ref 13.0–17.0)
MCH: 25.8 pg — ABNORMAL LOW (ref 26.0–34.0)
MCHC: 31.3 g/dL (ref 30.0–36.0)
MCV: 82.3 fL (ref 78.0–100.0)
Platelets: 293 10*3/uL (ref 150–400)
RBC: 3.22 MIL/uL — AB (ref 4.22–5.81)
RDW: 16.9 % — ABNORMAL HIGH (ref 11.5–15.5)
WBC: 20.5 10*3/uL — AB (ref 4.0–10.5)

## 2015-08-15 LAB — BASIC METABOLIC PANEL
ANION GAP: 11 (ref 5–15)
BUN: 106 mg/dL — ABNORMAL HIGH (ref 6–20)
CALCIUM: 8.5 mg/dL — AB (ref 8.9–10.3)
CO2: 31 mmol/L (ref 22–32)
Chloride: 100 mmol/L — ABNORMAL LOW (ref 101–111)
Creatinine, Ser: 5.22 mg/dL — ABNORMAL HIGH (ref 0.61–1.24)
GFR calc non Af Amer: 11 mL/min — ABNORMAL LOW (ref >60)
GFR, EST AFRICAN AMERICAN: 13 mL/min — AB (ref >60)
Glucose, Bld: 172 mg/dL — ABNORMAL HIGH (ref 65–99)
Potassium: 5.1 mmol/L (ref 3.5–5.1)
Sodium: 142 mmol/L (ref 135–145)

## 2015-08-15 LAB — RENAL FUNCTION PANEL
ALBUMIN: 2.3 g/dL — AB (ref 3.5–5.0)
Anion gap: 15 (ref 5–15)
BUN: 92 mg/dL — AB (ref 6–20)
CHLORIDE: 102 mmol/L (ref 101–111)
CO2: 24 mmol/L (ref 22–32)
CREATININE: 4.83 mg/dL — AB (ref 0.61–1.24)
Calcium: 8.8 mg/dL — ABNORMAL LOW (ref 8.9–10.3)
GFR calc non Af Amer: 12 mL/min — ABNORMAL LOW (ref >60)
GFR, EST AFRICAN AMERICAN: 14 mL/min — AB (ref >60)
GLUCOSE: 125 mg/dL — AB (ref 65–99)
POTASSIUM: 5.5 mmol/L — AB (ref 3.5–5.1)
Phosphorus: 8.6 mg/dL — ABNORMAL HIGH (ref 2.5–4.6)
SODIUM: 141 mmol/L (ref 135–145)

## 2015-08-15 LAB — POCT I-STAT 3, ART BLOOD GAS (G3+)
Acid-Base Excess: 5 mmol/L — ABNORMAL HIGH (ref 0.0–2.0)
Bicarbonate: 32.9 mEq/L — ABNORMAL HIGH (ref 20.0–24.0)
O2 SAT: 98 %
PH ART: 7.26 — AB (ref 7.350–7.450)
TCO2: 35 mmol/L (ref 0–100)
pCO2 arterial: 73.4 mmHg (ref 35.0–45.0)
pO2, Arterial: 129 mmHg — ABNORMAL HIGH (ref 80.0–100.0)

## 2015-08-15 LAB — GLUCOSE, CAPILLARY
Glucose-Capillary: 131 mg/dL — ABNORMAL HIGH (ref 65–99)
Glucose-Capillary: 147 mg/dL — ABNORMAL HIGH (ref 65–99)
Glucose-Capillary: 146 mg/dL — ABNORMAL HIGH (ref 65–99)

## 2015-08-15 LAB — PROCALCITONIN: Procalcitonin: 2.51 ng/mL

## 2015-08-15 LAB — PARATHYROID HORMONE, INTACT (NO CA): PTH: 153 pg/mL — AB (ref 15–65)

## 2015-08-15 MED ORDER — SODIUM POLYSTYRENE SULFONATE 15 GM/60ML PO SUSP
30.0000 g | Freq: Once | ORAL | Status: AC
  Administered 2015-08-15: 30 g via ORAL
  Filled 2015-08-15: qty 120

## 2015-08-15 MED ORDER — SODIUM CHLORIDE 0.9 % IV SOLN
INTRAVENOUS | Status: DC
  Administered 2015-08-15 – 2015-08-16 (×2): via INTRAVENOUS

## 2015-08-15 MED ORDER — INSULIN ASPART 100 UNIT/ML ~~LOC~~ SOLN
0.0000 [IU] | SUBCUTANEOUS | Status: DC
  Administered 2015-08-15 (×3): 1 [IU] via SUBCUTANEOUS
  Administered 2015-08-16 (×4): 2 [IU] via SUBCUTANEOUS
  Administered 2015-08-16: 3 [IU] via SUBCUTANEOUS
  Administered 2015-08-16 – 2015-08-17 (×4): 1 [IU] via SUBCUTANEOUS
  Administered 2015-08-17 (×2): 3 [IU] via SUBCUTANEOUS
  Administered 2015-08-17: 1 [IU] via SUBCUTANEOUS
  Administered 2015-08-18: 2 [IU] via SUBCUTANEOUS
  Administered 2015-08-18 (×4): 1 [IU] via SUBCUTANEOUS
  Administered 2015-08-19: 2 [IU] via SUBCUTANEOUS
  Administered 2015-08-19: 1 [IU] via SUBCUTANEOUS
  Administered 2015-08-19 (×2): 2 [IU] via SUBCUTANEOUS
  Administered 2015-08-19: 1 [IU] via SUBCUTANEOUS
  Administered 2015-08-20: 3 [IU] via SUBCUTANEOUS
  Administered 2015-08-20: 2 [IU] via SUBCUTANEOUS

## 2015-08-15 MED ORDER — SODIUM BICARBONATE 8.4 % IV SOLN
INTRAVENOUS | Status: DC
  Administered 2015-08-15: 12:00:00 via INTRAVENOUS
  Filled 2015-08-15 (×2): qty 150

## 2015-08-15 MED ORDER — ALTEPLASE 2 MG IJ SOLR
2.0000 mg | Freq: Once | INTRAMUSCULAR | Status: AC | PRN
  Administered 2015-08-15: 2 mg
  Filled 2015-08-15: qty 2

## 2015-08-15 MED ORDER — SODIUM POLYSTYRENE SULFONATE 15 GM/60ML PO SUSP: 15.0000 g | Freq: Once | ORAL | Status: DC

## 2015-08-15 MED ORDER — CALCIUM ACETATE (PHOS BINDER) 667 MG PO CAPS: 1334.0000 mg | ORAL_CAPSULE | Freq: Three times a day (TID) | ORAL | Status: DC

## 2015-08-15 MED ORDER — HEPARIN SODIUM (PORCINE) 1000 UNIT/ML DIALYSIS
1000.0000 [IU] | INTRAMUSCULAR | Status: DC | PRN
  Administered 2015-08-15: 2800 [IU] via INTRAVENOUS_CENTRAL
  Filled 2015-08-15: qty 3
  Filled 2015-08-15 (×2): qty 6

## 2015-08-15 MED ORDER — HEPARIN SODIUM (PORCINE) 1000 UNIT/ML DIALYSIS: 1000.0000 [IU] | INTRAMUSCULAR | Status: DC | PRN

## 2015-08-15 NOTE — Progress Notes (Signed)
RT removed patient from BIPAP. Patient tolerating well at this time. No complications. Vital signs stable. RT will continue to monitor.

## 2015-08-15 NOTE — Progress Notes (Signed)
eLink Physician-Brief Progress Note Patient Name: Joban Bhuiyan DOB: 1961-07-22 MRN: [id number]   Date of Service  08/15/2015  HPI/Events of Note  No AM labs ordered.   eICU Interventions  Will order CBC, BMP and Magnesium level in AM.     Intervention Category Minor Interventions: Routine modifications to care plan (e.g. PRN medications for pain, fever);Clinical assessment - ordering diagnostic tests  Lysle Dingwall 08/15/2015, 10:18 PM

## 2015-08-15 NOTE — Consult Note (Signed)
PULMONARY / CRITICAL CARE MEDICINE   Name: Noah Garner MRN: [id number] DOB: 1961-06-11    ADMISSION DATE:  08/10/2015 CONSULTATION DATE:  08/14/15  REFERRING MD :  Dr. Sheran Fava   CHIEF COMPLAINT:  Hypercarbic Respiratory Failure  HISTORY OF PRESENT ILLNESS:  Information obtained from staff and prior medical documentation as he is altered.    54 y/o M with PMH of HTN, CHF, morbid obesity, OSA and rheumatoid arthritis (recent diagnosis seen at [facility], not yet followed up with Rheumatology or initiated treatment. RF of 19.9 (<13.9) and ANA 1:80, elevated ESR, CRP on 07/16/15 at [facility]) who presented to [facility] on 11/27 with reports of progressive weakness over a 2 month period.   The patient reported on admit that he had been experiencing a progressive decline to the point that he had become bed bound, incontinent of bowel and bladder due to the inability to ambulate to the restroom.  He reported R>L lower extremity weakness and pain in all of his joints.  He denied infectious symptoms on admit.  On the day of admit, he attempted to stand and suffered two mechanical falls - once striking his face on the floor. XRays of T & L Spine were negative for acute findings.  The patient also reported he had noted swelling of his lower extremities and weight gain.  He was admitted per [facility].  Initial labs notable for acute kidney injury with BUN of 80, serum creatinine of 4.62 and anemia with hgb of 7.9.  He was found to have a lower extremity ulcer on admission which he reported had been there for approximately one month.  The patient was felt to be volume overloaded with baseline weight of 172kg and was up to 184 kg on admit.  Cardiology was consulted for evaluation.  Prior ECHO in 04/2015 at [facility] was notable for an LVEF of 50%.  He was treated with IV lasix, beta blocker and fluid restriction.  Repeat ECHO on 11/29 showed severe LV dilation, mild LVH, moderate to severe reduction of systolic dysfunction with an  LVEF of 38-25%, grade 1 diastolic dysfunction.  Nephrology was also consulted in the setting of acute on (suspected) chronic CKD.  Renal ultrasound was evaluated 11/30 which demonstrated mild cortical thinning in both kidneys, no hydronephrosis. The patient developed progressive lethargy / hypoxia and an ABG was evaluated - 7.269 / 61 / 46 / 27.  The patient was treated with NIPPV and an ABG was repeated several hours later that did not reflect significant change.  PCCM was consulted for evaluation of acute respiratory failure.    SUBJECTIVE: remains on BIPAP, neg 600 cc  VITAL SIGNS: BP 114/67 mmHg  Pulse 76  Temp(Src) 97.7 F (36.5 C) (Axillary)  Resp 16  Ht '5\' 11"'  (1.803 m)  Wt 179.171 kg (395 lb)  BMI 55.12 kg/m2  SpO2 99%  HEMODYNAMICS:    VENTILATOR SETTINGS: Vent Mode:  [-] BIPAP FiO2 (%):  [40 %] 40 % Set Rate:  [16 bmp] 16 bmp PEEP:  [8 cmH20] 8 cmH20  INTAKE / OUTPUT: I/O last 3 completed shifts: In: 69 [P.O.:280; I.V.:10; IV Piggyback:422] Out: 0539 [Urine:1335]  PHYSICAL EXAMINATION: General:  Obese male in NAD on BiPAP Neuro:  follows commands, drowsy but interacts and MAE HEENT:  MM pink/moist, JVD Cardiovascular:  s1s2 distant, regular  Lungs:  reduced Abdomen:  Obese/soft, bsx4 active  Musculoskeletal:  No acute deformities Skin:   Edema 2  LABS:  CBC  Recent Labs Lab  08/12/15 0317 08/14/15 0440 08/15/15 0302  WBC 13.1* 20.7* 20.5*  HGB 7.9* 7.8* 8.3*  HCT 24.5* 23.8* 26.5*  PLT 261 292 293   Coag's  Recent Labs Lab 08/14/15 1101  APTT 34  INR 1.58*   BMET  Recent Labs Lab 08/13/15 0810 08/14/15 0440 08/15/15 0302  NA 138 138 141  K 4.1 4.4 5.5*  CL 104 101 102  CO2 '26 27 24  ' BUN 69* 76* 92*  CREATININE 4.55* 4.52* 4.83*  GLUCOSE 136* 126* 125*   Electrolytes  Recent Labs Lab 08/13/15 0810 08/14/15 0440 08/15/15 0302  CALCIUM 8.9 9.2 8.8*  MG  --   --  2.2  PHOS  --  4.4 8.6*   Sepsis Markers  Recent Labs Lab  08/14/15 1101 08/14/15 1230 08/14/15 2032 08/15/15 0302  LATICACIDVEN 0.6 0.6  --   --   PROCALCITON 2.46  --  2.51 2.51   ABG  Recent Labs Lab 08/14/15 0940 08/14/15 1420 08/14/15 1806  PHART 7.269* 7.285* 7.307*  PCO2ART 61.1* 57.7* 59.7*  PO2ART 46.8* 175* 172.0*   Liver Enzymes  Recent Labs Lab 08/10/15 1925 08/14/15 0440 08/15/15 0302  AST 49* 27  --   ALT 28 25  --   ALKPHOS 67 70  --   BILITOT 0.8 1.0  --   ALBUMIN 2.7* 2.3* 2.3*   Cardiac Enzymes No results for input(s): TROPONINI, PROBNP in the last 168 hours.   Glucose  Recent Labs Lab 08/14/15 1005  GLUCAP 132*    Imaging Ct Head Wo Contrast  08/14/2015  CLINICAL DATA:  Altered mental status following fall earlier today EXAM: CT HEAD WITHOUT CONTRAST TECHNIQUE: Contiguous axial images were obtained from the base of the skull through the vertex without intravenous contrast. COMPARISON:  None. FINDINGS: The ventricles are normal in size and configuration. There is no intracranial mass, hemorrhage, extra-axial fluid collection, or midline shift. Gray-white compartments appear normal. No acute infarct evident. Bony calvarium appears intact. The mastoid air cells are clear. No intraorbital lesions are identified. IMPRESSION: Study within normal limits. Electronically Signed   By: Lowella Grip III M.D.   On: 08/14/2015 15:52   Dg Chest Port 1 View  08/15/2015  CLINICAL DATA:  Acute respiratory failure EXAM: PORTABLE CHEST 1 VIEW COMPARISON:  Yesterday FINDINGS: Chronic cardiopericardial enlargement and vascular pedicle widening. Borderline pulmonary venous congestion; no edema. There is no consolidation, effusion, or pneumothorax. IMPRESSION: 1. No acute finding. 2. Chronic cardiomegaly. Electronically Signed   By: Monte Fantasia M.D.   On: 08/15/2015 07:16   Dg Chest Port 1 View  08/14/2015  CLINICAL DATA:  Hypoxia.  Hypertension EXAM: PORTABLE CHEST 1 VIEW COMPARISON:  August 12, 2015 FINDINGS:  There is no edema or consolidation. There is cardiomegaly. The pulmonary vascularity is normal. No adenopathy. No bone lesions. IMPRESSION: Stable cardiac enlargement.  No edema or consolidation. Electronically Signed   By: Lowella Grip III M.D.   On: 08/14/2015 11:08     STUDIES:  11/29  ECHO >> severe LV dilation, mild LVH, moderate to severe reduction of systolic dysfunction with an LVEF of 31-51%, grade 1 diastolic dysfunction 76/16  Renal US >> mild cortical thinning in both kidneys, no hydronephrosis.    CULTURES: 11/30  MRSA PCR >> positive  11/30  BCx2 >>   ANTIBIOTICS: Zosyn 11/30 >>   SIGNIFICANT EVENTS: 11/26  Admit with weakness, swelling, falls.  Found to have AKI, anemia, volume overload.  LINES/TUBES:   DISCUSSION: 54 y/o  M with PMH of HTN, CHF and new recent diagnosis of rheumatoid arthritis who presented to [facility] on 11/26 with 2 month history of progressive weakness, joint pain, falls, weight gain and incontinence.  The patient was found to have anemia, AKI, acute on chronic CHF exacerbation.  He later (11/30) developed hypercarbic / hypoxic respiratory failure requiring bipap.    ASSESSMENT / PLAN:  PULMONARY A: Acute hypercarbic / hypoxic respiratory failure - in setting of AKI, decompensated CHF, +/- autoimmune process with weakness, untreated OSA  OSA  R/o pneumonitis P:   Lasix continued to neg balance continued NIMV, must interrupt NIMV to avoid facial truama, plan interrupt goal off 1 hr abg off, then recycle PC 4 hrs on 1 hr off pcxr in am  consider CT chest , esr - 140 with clinical presentation Maintain empiric steroids, low threshold to escalate   CARDIOVASCULAR A:  Acute on Chronic Systolic CHF  Uncontrolled HTN  P:  Hemodynamic monitoring in ICU  Continue Imdur, hydralazine & lasix  Cardiology following  RENAL A:   Acute on Chronic Kidney Disease - previously seen by Nephrology in [facility], unknown baseline sr cr   P:   Trend BMP / UOP   May need cvp, line placement  May need to reduce lasix kayxlate  GASTROINTESTINAL A:   Morbid Obesity - BMI 56 P:   Dc renal diet  nimv still required  HEMATOLOGIC A:   Anemia - suspect of renal disease, r/o blood loss anemia, B12 and folate wnl P:  FOBT stool  Trend CBC  Heparin for DVT prophylaxis   INFECTIOUS A:   Leukocytosis  LLE Ulcer - present on admission R/o hcap P:   Zosyn >>> Maintain Ct chest Pct unimpresive  ENDOCRINE A:   Migrating Arthritis - ? Rheumatoid Arthritis - prior positive labs RF of 19.9 (<13.9) and ANA 1:80, elevated ESR, CRP on 07/16/15 at [facility]). P:   Esr 140 C3, c4, anca, antigbm, ana, dsdna pending ssi Steroids UA not active sed  NEUROLOGIC A:   Metabolic Encephalopathy - in setting of AKI, hypercarbic / hypoxic respiratory failure.  Exam non-toxic. Mechanical Fall - prior to admit, T&L Spine XRray negative  Weakness / Failure to Thrive  P:   RASS goal: 0 ABg off nimv Avoid benzo  FAMILY  - Updates:  No family at bedside.  - Inter-disciplinary family meet or Palliative Care meeting due by:  12/8  Ccm time 30 min   Lavon Paganini. Titus Mould, MD, [place] Pgr: [facility] 08/15/2015 10:10 AM

## 2015-08-15 NOTE — Progress Notes (Signed)
Assessment:  1 Chronic CKD, suspected stage 5 with possible uremia 2 Volume overload 3 Anemia, iron deficient and ESA deficient-Rx'd 4 LE wounds 5 Morbid obesity 6 Pos Rheumatoid factor 7 Hypertension 8 Hypercarbic resp insuff, on BiPAP 9 Resp Acidosis 10 Hyperkalemia  Plan: 1 Hold diuretics.  2 Rec stopping bicarb  3 kaexyalate  Subjective: Interval History: Remains on BiPAP with improved ABGs  Objective: Vital signs in last 24 hours: Temp:  [96.5 F (35.8 C)-98.4 F (36.9 C)] 97.7 F (36.5 C) (12/01 0800) Pulse Rate:  [71-89] 78 (12/01 1142) Resp:  [12-30] 28 (12/01 1142) BP: (99-132)/(55-90) 119/57 mmHg (12/01 1142) SpO2:  [95 %-100 %] 99 % (12/01 0900) FiO2 (%):  [40 %] 40 % (12/01 1143) Weight change:   Intake/Output from previous day: 11/30 0701 - 12/01 0700 In: 432 [I.V.:10; IV Piggyback:422] Out: 1035 [Urine:1035] Intake/Output this shift: Total I/O In: 23 [I.V.:23] Out: 900 [Urine:900]  General appearance: alert and cooperative Head: Normocephalic, without obvious abnormality, atraumatic, BiPAP Resp: clear to auscultation bilaterally Chest wall: no tenderness Cardio: regular rate and rhythm, S1, S2 normal, no murmur, click, rub or gallop Extremities: edema chronic changes  Lab Results:  Recent Labs  08/14/15 0440 08/15/15 0302  WBC 20.7* 20.5*  HGB 7.8* 8.3*  HCT 23.8* 26.5*  PLT 292 293   BMET:  Recent Labs  08/14/15 0440 08/15/15 0302  NA 138 141  K 4.4 5.5*  CL 101 102  CO2 27 24  GLUCOSE 126* 125*  BUN 76* 92*  CREATININE 4.52* 4.83*  CALCIUM 9.2 8.8*    Recent Labs  08/14/15 0440  PTH 153*   Iron Studies: No results for input(s): IRON, TIBC, TRANSFERRIN, FERRITIN in the last 72 hours. Studies/Results: Ct Head Wo Contrast  08/14/2015  CLINICAL DATA:  Altered mental status following fall earlier today EXAM: CT HEAD WITHOUT CONTRAST TECHNIQUE: Contiguous axial images were obtained from the base of the skull through  the vertex without intravenous contrast. COMPARISON:  None. FINDINGS: The ventricles are normal in size and configuration. There is no intracranial mass, hemorrhage, extra-axial fluid collection, or midline shift. Gray-white compartments appear normal. No acute infarct evident. Bony calvarium appears intact. The mastoid air cells are clear. No intraorbital lesions are identified. IMPRESSION: Study within normal limits. Electronically Signed   By: Lowella Grip III M.D.   On: 08/14/2015 15:52   US Renal  08/14/2015  CLINICAL DATA:  Chronic kidney disease and hypertension. EXAM: RENAL / URINARY TRACT ULTRASOUND COMPLETE COMPARISON:  None. FINDINGS: Right Kidney: Length: 11.9 cm.  Mild cortical thinning without hydronephrosis. Left Kidney: Length: 11.5 cm.  Mild cortical thinning without hydronephrosis. Bladder: Appears normal for degree of bladder distention. IMPRESSION: Mild cortical thinning in both kidneys. No hydronephrosis. Electronically Signed   By: Markus Daft M.D.   On: 08/14/2015 07:40   Dg Chest Port 1 View  08/15/2015  CLINICAL DATA:  Acute respiratory failure EXAM: PORTABLE CHEST 1 VIEW COMPARISON:  Yesterday FINDINGS: Chronic cardiopericardial enlargement and vascular pedicle widening. Borderline pulmonary venous congestion; no edema. There is no consolidation, effusion, or pneumothorax. IMPRESSION: 1. No acute finding. 2. Chronic cardiomegaly. Electronically Signed   By: Monte Fantasia M.D.   On: 08/15/2015 07:16   Dg Chest Port 1 View  08/14/2015  CLINICAL DATA:  Hypoxia.  Hypertension EXAM: PORTABLE CHEST 1 VIEW COMPARISON:  August 12, 2015 FINDINGS: There is no edema or consolidation. There is cardiomegaly. The pulmonary vascularity is normal. No adenopathy. No bone lesions. IMPRESSION: Stable  cardiac enlargement.  No edema or consolidation. Electronically Signed   By: Lowella Grip III M.D.   On: 08/14/2015 11:08    Scheduled: . antiseptic oral rinse  7 mL Mouth Rinse q12n4p   . chlorhexidine  15 mL Mouth Rinse BID  . Chlorhexidine Gluconate Cloth  6 each Topical Q0600  . furosemide  120 mg Intravenous Q8H  . heparin  5,000 Units Subcutaneous 3 times per day  . hydrocerin   Topical Daily  . insulin aspart  0-9 Units Subcutaneous 6 times per day  . methylPREDNISolone (SOLU-MEDROL) injection  80 mg Intravenous 3 times per day  . mupirocin ointment  1 application Nasal BID  . piperacillin-tazobactam (ZOSYN)  IV  3.375 g Intravenous Q8H  . sodium chloride  3 mL Intravenous Q12H   Continuous: . sodium chloride 10 mL/hr at 08/15/15 0922  .  sodium bicarbonate  infusion 1000 mL        LOS: 4 days   Ondra Deboard C 08/15/2015,12:16 PM

## 2015-08-15 NOTE — Progress Notes (Signed)
PT Cancellation Note  Patient Details Name: Noah Garner MRN: [id number] DOB: 04/11/1961   Cancelled Treatment:    Reason Eval/Treat Not Completed: Patient at procedure or test/unavailable.  First attempt, pt getting central line, 2nd attempt, pt in xray.  Will see 12/3 as able. 08/15/2015  Donnella Sham, [place] [phone]  (pager)   [name], Tessie Fass 08/15/2015, 2:54 PM

## 2015-08-15 NOTE — Procedures (Signed)
Central Venous Catheter Insertion Procedure Note Noah Garner [id number] August 18, 1961  Procedure: Insertion of Central Venous Catheter Indications: Assessment of intravascular volume, Drug and/or fluid administration, Frequent blood sampling and possibel hd  Procedure Details Consent: Risks of procedure as well as the alternatives and risks of each were explained to the (patient/caregiver).  Consent for procedure obtained. Time Out: Verified patient identification, verified procedure, site/side was marked, verified correct patient position, special equipment/implants available, medications/allergies/relevent history reviewed, required imaging and test results available.  Performed  Maximum sterile technique was used including antiseptics, cap, gloves, gown, hand hygiene, mask and sheet. Skin prep: Chlorhexidine; local anesthetic administered A antimicrobial bonded/coated triple lumen catheter was placed in the left internal jugular vein using the Seldinger technique.  Evaluation Blood flow good Complications: No apparent complications Patient did tolerate procedure well. Chest X-ray ordered to verify placement.  CXR: pending.  Noah Garner 08/15/2015, 2:23 PM  [place] Noah Garner. Titus Mould, MD, Noah Garner Pgr: [facility]

## 2015-08-15 NOTE — Progress Notes (Signed)
Subjective:  Lethargic but responds to questions. On Bi Pap  Objective:  Vital Signs in the last 24 hours: Temp:  [96.5 F (35.8 C)-98.4 F (36.9 C)] 97.7 F (36.5 C) (12/01 0800) Pulse Rate:  [71-89] 76 (12/01 0901) Resp:  [12-30] 16 (12/01 0901) BP: (98-132)/(55-90) 114/67 mmHg (12/01 0901) SpO2:  [57 %-100 %] 100 % (12/01 0800) FiO2 (%):  [40 %] 40 % (12/01 0902)  Intake/Output from previous day:  Intake/Output Summary (Last 24 hours) at 08/15/15 0921 Last data filed at 08/15/15 0900  Gross per 24 hour  Intake    422 ml  Output   1935 ml  Net  -1513 ml    Physical Exam: General appearance: cooperative, morbidly obese and lethargic, on Bi Pap Lungs: decreased breath sounds, no obvious wheezing Heart: regular rate and rhythm Extremities: 3+, chronic skin changes Neurologic: Lethargic   Rate: 78  Rhythm: normal sinus rhythm  Lab Results:  Recent Labs  08/14/15 0440 08/15/15 0302  WBC 20.7* 20.5*  HGB 7.8* 8.3*  PLT 292 293    Recent Labs  08/14/15 0440 08/15/15 0302  NA 138 141  K 4.4 5.5*  CL 101 102  CO2 27 24  GLUCOSE 126* 125*  BUN 76* 92*  CREATININE 4.52* 4.83*   No results for input(s): TROPONINI in the last 72 hours.  Invalid input(s): CK, MB  Recent Labs  08/14/15 1101  INR 1.58*    Scheduled Meds: . antiseptic oral rinse  7 mL Mouth Rinse q12n4p  . calcium acetate  1,334 mg Oral TID WC  . chlorhexidine  15 mL Mouth Rinse BID  . Chlorhexidine Gluconate Cloth  6 each Topical Q0600  . furosemide  120 mg Intravenous Q8H  . heparin  5,000 Units Subcutaneous 3 times per day  . hydrALAZINE  10 mg Oral 3 times per day  . hydrocerin   Topical Daily  . isosorbide mononitrate  15 mg Oral Daily  . methylPREDNISolone (SOLU-MEDROL) injection  80 mg Intravenous 3 times per day  . mupirocin ointment  1 application Nasal BID  . piperacillin-tazobactam (ZOSYN)  IV  3.375 g Intravenous Q8H  . sodium chloride  3 mL Intravenous Q12H  .  sodium polystyrene  15 g Oral Once   Continuous Infusions: . sodium chloride     PRN Meds:.acetaminophen **OR** acetaminophen, alum & mag hydroxide-simeth, ondansetron **OR** ondansetron (ZOFRAN) IV   Imaging: Ct Head Wo Contrast  08/14/2015  CLINICAL DATA:  Altered mental status following fall earlier today EXAM: CT HEAD WITHOUT CONTRAST   IMPRESSION: Study within normal limits. Electronically Signed   By: Lowella Grip III M.D.   On: 08/14/2015 15:52   US Renal  08/14/2015  CLINICAL DATA:  Chronic kidney disease and hypertension. EXAM: RENAL / URINARY TRACT ULTRASOUND   IMPRESSION: Mild cortical thinning in both kidneys. No hydronephrosis. Electronically Signed   By: Markus Daft M.D.   On: 08/14/2015 07:40   Dg Chest Port 1 View  08/15/2015  CLINICAL DATA:  Acute respiratory failure EXAM: PORTABLE CHEST 1 VIEW COMPARISON:  Yesterday FINDINGS: Chronic cardiopericardial enlargement and vascular pedicle widening. Borderline pulmonary venous congestion; no edema. There is no consolidation, effusion, or pneumothorax. IMPRESSION: 1. No acute finding. 2. Chronic cardiomegaly. Electronically Signed   By: Monte Fantasia M.D.   On: 08/15/2015 07:16     Cardiac Studies: Echo Study Conclusions  - Left ventricle: The cavity size was severely dilated. Wall thickness was increased in a pattern of  mild LVH. Systolic function was moderately to severely reduced. The estimated ejection fraction was in the range of 30% to 35%. Doppler parameters are consistent with abnormal left ventricular relaxation (grade 1 diastolic dysfunction). - Impressions: Extremely limited; definity used but LV function still difficult to quantitate; moderate to severe global reduction in LV function; grade 1 diastolic dysfunction; severe LVE; mild LVH. Suggest cardiac MRI or MUGA to better assess LV function.  Impressions:  - Extremely limited; definity used but LV function still  difficult to quantitate; moderate to severe global reduction in LV function; grade 1 diastolic dysfunction; severe LVE; mild LVH. Suggest cardiac MRI or MUGA to better assess LV function.  Assessment/Plan:  54 y.o. AA Male, recently moved her from [facility], with a PMH of morbid obesity (BMI 56), HTN, CHF, NICM, and rheumatoid arthritis (recently diagnosed, not on treatment) who presented to [facility] ED on 08/10/2015 for worsening lower back pain, joint pain, and increased weakness. He reports decreased functional capacity over the past month and had two falls on his day of admission, which prompted him to seek medical attention. His SCr was >4. EF 30% by echo with past history of normal coronaries. After admission he developed respiratory failure with hypercapnia on the floor and was transferred to ICU.   Principal Problem:   Acute respiratory failure with hypoxia and hypercapnia (HCC) Active Problems:   AKI (acute kidney injury) ([place])   Acute on chronic combined systolic and diastolic heart failure (HCC)   Morbid obesity with alveolar hypoventilation (HCC)   Hypertension   Metabolic encephalopathy   Debilitated patient   NICM (nonischemic cardiomyopathy) (HCC)   Arthritis   Stasis ulcer ([place])   PLAN: IV Lasix per renal. K+ 5.5 this am, on no supplement, Kayexalate ordered but RN feels the pt is too lethargic to take this. Check K+ this pm. ? If he is a candidate for dialysis, SCr up to 4.8, I/O neg 2L.     Kerin Ransom PA-C 08/15/2015, 9:21 AM [phone]  Patient seen and examined and history reviewed. Agree with above findings and plan. Still on BIPAP. Difficult to arouse. Diuresing with IV lasix. Negative 2 liters since admission. BUN and creatinine rising. BUN may be increased with steroids. Renal following. Pulmonary/CCM on board.  On Coreg for LV dysfunction. Addition of hydralazine/nitrates limited by low BP.  Peter Martinique, [place] 08/15/2015 11:02 AM

## 2015-08-15 NOTE — Progress Notes (Signed)
Spoke with NP Schorr regarding Potassium value 5.5.  Patient currently still on Bi-pap and NPO, NP Schorr advised to continue to monitor patient status. Vicie Mutters, RN

## 2015-08-16 ENCOUNTER — Inpatient Hospital Stay (HOSPITAL_COMMUNITY): Payer: Medicare Other

## 2015-08-16 DIAGNOSIS — W19XXXA Unspecified fall, initial encounter: Secondary | ICD-10-CM | POA: Insufficient documentation

## 2015-08-16 DIAGNOSIS — Z452 Encounter for adjustment and management of vascular access device: Secondary | ICD-10-CM

## 2015-08-16 DIAGNOSIS — E662 Morbid (severe) obesity with alveolar hypoventilation: Secondary | ICD-10-CM

## 2015-08-16 DIAGNOSIS — Y92099 Unspecified place in other non-institutional residence as the place of occurrence of the external cause: Secondary | ICD-10-CM

## 2015-08-16 DIAGNOSIS — Y92009 Unspecified place in unspecified non-institutional (private) residence as the place of occurrence of the external cause: Secondary | ICD-10-CM

## 2015-08-16 LAB — RENAL FUNCTION PANEL
ALBUMIN: 1.9 g/dL — AB (ref 3.5–5.0)
Anion gap: 19 — ABNORMAL HIGH (ref 5–15)
BUN: 105 mg/dL — AB (ref 6–20)
CHLORIDE: 96 mmol/L — AB (ref 101–111)
CO2: 29 mmol/L (ref 22–32)
Calcium: 7.9 mg/dL — ABNORMAL LOW (ref 8.9–10.3)
Creatinine, Ser: 5.09 mg/dL — ABNORMAL HIGH (ref 0.61–1.24)
GFR calc Af Amer: 14 mL/min — ABNORMAL LOW (ref >60)
GFR calc non Af Amer: 12 mL/min — ABNORMAL LOW (ref >60)
GLUCOSE: 162 mg/dL — AB (ref 65–99)
POTASSIUM: 4.4 mmol/L (ref 3.5–5.1)
Phosphorus: 6.3 mg/dL — ABNORMAL HIGH (ref 2.5–4.6)
Sodium: 144 mmol/L (ref 135–145)

## 2015-08-16 LAB — GLUCOSE, CAPILLARY
GLUCOSE-CAPILLARY: 148 mg/dL — AB (ref 65–99)
GLUCOSE-CAPILLARY: 155 mg/dL — AB (ref 65–99)
GLUCOSE-CAPILLARY: 160 mg/dL — AB (ref 65–99)
Glucose-Capillary: 153 mg/dL — ABNORMAL HIGH (ref 65–99)
Glucose-Capillary: 157 mg/dL — ABNORMAL HIGH (ref 65–99)
Glucose-Capillary: 212 mg/dL — ABNORMAL HIGH (ref 65–99)

## 2015-08-16 LAB — MPO/PR-3 (ANCA) ANTIBODIES

## 2015-08-16 LAB — CBC
HEMATOCRIT: 23.4 % — AB (ref 39.0–52.0)
Hemoglobin: 7.1 g/dL — ABNORMAL LOW (ref 13.0–17.0)
MCH: 24.8 pg — ABNORMAL LOW (ref 26.0–34.0)
MCHC: 30.3 g/dL (ref 30.0–36.0)
MCV: 81.8 fL (ref 78.0–100.0)
PLATELETS: 340 10*3/uL (ref 150–400)
RBC: 2.86 MIL/uL — ABNORMAL LOW (ref 4.22–5.81)
RDW: 16.8 % — AB (ref 11.5–15.5)
WBC: 14.3 10*3/uL — ABNORMAL HIGH (ref 4.0–10.5)

## 2015-08-16 LAB — ANTINUCLEAR ANTIBODIES, IFA: ANTINUCLEAR ANTIBODIES, IFA: NEGATIVE

## 2015-08-16 LAB — PROCALCITONIN: Procalcitonin: 1.43 ng/mL

## 2015-08-16 LAB — GLOMERULAR BASEMENT MEMBRANE ANTIBODIES: GBM Ab: 3 units (ref 0–20)

## 2015-08-16 LAB — ANCA TITERS
Atypical P-ANCA titer: <1:20 {titer}
C-ANCA: <1:20 {titer}
P-ANCA: <1:20 {titer}

## 2015-08-16 LAB — MAGNESIUM: Magnesium: 2 mg/dL (ref 1.7–2.4)

## 2015-08-16 LAB — C3 COMPLEMENT: C3 Complement: 181 mg/dL — ABNORMAL HIGH (ref 82–167)

## 2015-08-16 LAB — ANTI-DNA ANTIBODY, DOUBLE-STRANDED: DS DNA AB: 1 [IU]/mL (ref 0–9)

## 2015-08-16 LAB — C4 COMPLEMENT: COMPLEMENT C4, BODY FLUID: 35 mg/dL (ref 14–44)

## 2015-08-16 MED ORDER — METHYLPREDNISOLONE SODIUM SUCC 40 MG IJ SOLR: 40.0000 mg | Freq: Three times a day (TID) | INTRAMUSCULAR | Status: DC

## 2015-08-16 MED ORDER — METHYLPREDNISOLONE SODIUM SUCC 40 MG IJ SOLR
40.0000 mg | Freq: Two times a day (BID) | INTRAMUSCULAR | Status: DC
  Administered 2015-08-16 – 2015-08-19 (×6): 40 mg via INTRAVENOUS
  Filled 2015-08-16 (×6): qty 1

## 2015-08-16 MED ORDER — CARVEDILOL 6.25 MG PO TABS
6.2500 mg | ORAL_TABLET | Freq: Two times a day (BID) | ORAL | Status: DC
  Administered 2015-08-16 – 2015-08-20 (×9): 6.25 mg via ORAL
  Filled 2015-08-16 (×9): qty 1

## 2015-08-16 NOTE — Progress Notes (Signed)
PULMONARY / CRITICAL CARE MEDICINE   Name: Noah Garner MRN: [id number] DOB: Jan 22, 1961    ADMISSION DATE:  08/10/2015 CONSULTATION DATE:  08/14/15  REFERRING MD :  Dr. Sheran Fava   CHIEF COMPLAINT:  Hypercarbic Respiratory Failure  HISTORY OF PRESENT ILLNESS:  Information obtained from staff and prior medical documentation as he is altered.    54 y/o M with PMH of HTN, CHF, morbid obesity, OSA and rheumatoid arthritis (recent diagnosis seen at [facility], not yet followed up with Rheumatology or initiated treatment. RF of 19.9 (<13.9) and ANA 1:80, elevated ESR, CRP on 07/16/15 at [facility]) who presented to [facility] on 11/27 with reports of progressive weakness over a 2 month period.   The patient reported on admit that he had been experiencing a progressive decline to the point that he had become bed bound, incontinent of bowel and bladder due to the inability to ambulate to the restroom.  He reported R>L lower extremity weakness and pain in all of his joints.  He denied infectious symptoms on admit.  On the day of admit, he attempted to stand and suffered two mechanical falls - once striking his face on the floor. XRays of T & L Spine were negative for acute findings.  The patient also reported he had noted swelling of his lower extremities and weight gain.  He was admitted per [facility].  Initial labs notable for acute kidney injury with BUN of 80, serum creatinine of 4.62 and anemia with hgb of 7.9.  He was found to have a lower extremity ulcer on admission which he reported had been there for approximately one month.  The patient was felt to be volume overloaded with baseline weight of 172kg and was up to 184 kg on admit.  Cardiology was consulted for evaluation.  Prior ECHO in 04/2015 at [facility] was notable for an LVEF of 50%.  He was treated with IV lasix, beta blocker and fluid restriction.  Repeat ECHO on 11/29 showed severe LV dilation, mild LVH, moderate to severe reduction of systolic dysfunction with an  LVEF of 39-76%, grade 1 diastolic dysfunction.  Nephrology was also consulted in the setting of acute on (suspected) chronic CKD.  Renal ultrasound was evaluated 11/30 which demonstrated mild cortical thinning in both kidneys, no hydronephrosis. The patient developed progressive lethargy / hypoxia and an ABG was evaluated - 7.269 / 61 / 46 / 27.  The patient was treated with NIPPV and an ABG was repeated several hours later that did not reflect significant change.  PCCM was consulted for evaluation of acute respiratory failure.    SUBJECTIVE: Excellent UOP (-2.9 L) while off diuretics. Breathing comfortably on [place].   VITAL SIGNS: BP 131/75 mmHg  Pulse 79  Temp(Src) 98.5 F (36.9 C) (Oral)  Resp 16  Ht '5\' 11"'  (1.803 m)  Wt 383 lb (173.728 kg)  BMI 53.44 kg/m2  SpO2 97%  HEMODYNAMICS: CVP:  [14 mmHg-19 mmHg] 14 mmHg  VENTILATOR SETTINGS: Vent Mode:  [-] BIPAP FiO2 (%):  [40 %] 40 % Set Rate:  [16 bmp] 16 bmp PEEP:  [5 cmH20] 5 cmH20  INTAKE / OUTPUT: I/O last 3 completed shifts: In: 785.8 [P.O.:120; I.V.:341.8; IV Piggyback:324] Out: 3810 [Urine:3810]  PHYSICAL EXAMINATION: General:  Obese man resting in bed, NAD Neuro: alert, follows commands, OFF NIMV HEENT:  [place]/AT, EOMI, mucus membranes moist  Cardiovascular:  s1s2 distant, regular  Lungs: reduced Abdomen: BS+, soft, obese, non-tender  Musculoskeletal:  No acute deformities Skin: 2+ edema  in lower extremities   LABS:  CBC  Recent Labs Lab 08/15/15 0302 08/15/15 2308 08/16/15 0356  WBC 20.5* 15.2* 14.3*  HGB 8.3* 8.1* 7.1*  HCT 26.5* 25.7* 23.4*  PLT 293 330 340   Coag's  Recent Labs Lab 08/14/15 1101  APTT 34  INR 1.58*   BMET  Recent Labs Lab 08/15/15 0302 08/15/15 1829 08/16/15 0356  NA 141 142 144  K 5.5* 5.1 4.4  CL 102 100* 96*  CO2 '24 31 29  ' BUN 92* 106* 105*  CREATININE 4.83* 5.22* 5.09*  GLUCOSE 125* 172* 162*   Electrolytes  Recent Labs Lab 08/14/15 0440 08/15/15 0302  08/15/15 1829 08/16/15 0356  CALCIUM 9.2 8.8* 8.5* 7.9*  MG  --  2.2  --  2.0  PHOS 4.4 8.6*  --  6.3*   Sepsis Markers  Recent Labs Lab 08/14/15 1101 08/14/15 1230 08/14/15 2032 08/15/15 0302 08/16/15 0356  LATICACIDVEN 0.6 0.6  --   --   --   PROCALCITON 2.46  --  2.51 2.51 1.43   ABG  Recent Labs Lab 08/14/15 1420 08/14/15 1806 08/15/15 1606  PHART 7.285* 7.307* 7.260*  PCO2ART 57.7* 59.7* 73.4*  PO2ART 175* 172.0* 129.0*   Liver Enzymes  Recent Labs Lab 08/10/15 1925 08/14/15 0440 08/15/15 0302 08/16/15 0356  AST 49* 27  --   --   ALT 28 25  --   --   ALKPHOS 67 70  --   --   BILITOT 0.8 1.0  --   --   ALBUMIN 2.7* 2.3* 2.3* 1.9*   Cardiac Enzymes No results for input(s): TROPONINI, PROBNP in the last 168 hours.   Glucose  Recent Labs Lab 08/15/15 1223 08/15/15 1532 08/15/15 2002 08/16/15 0011 08/16/15 0407 08/16/15 0731  GLUCAP 131* 147* 146* 160* 155* 153*    Imaging Ct Chest Wo Contrast  08/15/2015  CLINICAL DATA:  Dyspnea.  History of CHF and hypertension EXAM: CT CHEST WITHOUT CONTRAST TECHNIQUE: Multidetector CT imaging of the chest was performed following the standard protocol without IV contrast. COMPARISON:  None. FINDINGS: The central airways are patent. There is no focal consolidation. There is mild bibasilar atelectasis. There is no pleural effusion or pneumothorax. There are no pathologically enlarged axillary, hilar or mediastinal lymph nodes. The heart size is normal. There is no pericardial effusion. The thoracic aorta is normal in caliber. Left IJ central venous catheter with the tip projecting over the left brachycephalic vein. Review of bone windows demonstrates no focal lytic or sclerotic lesions. There is a chronic T11 vertebral body compression fracture. Limited non-contrast images of the upper abdomen were obtained. The adrenal glands appear normal. The remainder of the visualized abdominal organs are unremarkable. IMPRESSION:  No acute cardiopulmonary process. Electronically Signed   By: Kathreen Devoid   On: 08/15/2015 19:31   Dg Chest Port 1 View  08/16/2015  CLINICAL DATA:  Shortness of breath. EXAM: PORTABLE CHEST 1 VIEW COMPARISON:  CT 08/15/2015.  Chest x-ray 08/15/2015 . FINDINGS: Left IJ line noted in good anatomic position . Cardiomegaly . Mild pulmonary vascular prominence. Mild right base subsegmental atelectasis. Small left pleural effusion cannot be excluded. No pneumothorax . IMPRESSION: 1. Left IJ line in stable position. 2. Stable prominent cardiomegaly. Mild pulmonary vascular prominence. Small left pleural effusion. 3. Mild right base subsegmental atelectasis. Electronically Signed   By: Marcello Moores  Register   On: 08/16/2015 07:23   Dg Chest Port 1 View  08/15/2015  CLINICAL DATA:  Central catheter  placement EXAM: PORTABLE CHEST 1 VIEW COMPARISON:  Study obtained earlier in the day FINDINGS: Central catheter tip is at the junction of the left innominate vein and superior vena cava. No pneumothorax. There is no edema or consolidation. There is stable cardiomegaly. The pulmonary vascularity is within normal limits. No adenopathy. IMPRESSION: Central catheter tip at junction of left innominate vein and superior vena cava. No pneumothorax. Stable cardiomegaly. No edema or consolidation. Electronically Signed   By: Lowella Grip III M.D.   On: 08/15/2015 15:03     STUDIES:  11/29  ECHO >> severe LV dilation, mild LVH, moderate to severe reduction of systolic dysfunction with an LVEF of 82-99%, grade 1 diastolic dysfunction 37/16  Renal US >> mild cortical thinning in both kidneys, no hydronephrosis.  12/1 CT Chest>> mild bibasilar atelectasis, no evidence of pneumonitis   CULTURES: 11/30  MRSA PCR >> positive  11/30  BCx2 >>   ANTIBIOTICS: Zosyn 11/30 >> 12/2  SIGNIFICANT EVENTS: 11/26  Admit with weakness, swelling, falls.  Found to have AKI, anemia, volume overload.  LINES/TUBES: Left IJ HD cath  12/1>>>  DISCUSSION: 54 y/o M with PMH of HTN, CHF and new recent diagnosis of rheumatoid arthritis who presented to [facility] on 11/26 with 2 month history of progressive weakness, joint pain, falls, weight gain and incontinence.  The patient was found to have anemia, AKI, acute on chronic CHF exacerbation.  He later (11/30) developed hypercarbic / hypoxic respiratory failure requiring bipap.    ASSESSMENT / PLAN:  PULMONARY A: Acute hypercarbic / hypoxic respiratory failure - in setting of AKI, decompensated CHF, +/- autoimmune process with weakness, untreated OSA  R/o pneumonitis  P:   Diuretics on hold  pcxr w/ still some vascular congestion, sm left pleural effusion CT chest w/out evidence of pneumonitis  Breathing comfortably on [place] Continue empiric steroids   CARDIOVASCULAR A:  Acute on Chronic Systolic CHF  Uncontrolled HTN   P:  Hemodynamic monitoring in ICU  Addition of Imdur, hydralazine limited by low BP Cardiology following  RENAL A:   Acute on Chronic Kidney Disease - previously seen by Nephrology in [facility], unknown baseline sr cr   Hyperphosphatemia- likely due to secondary hyperparathyroidism, PTH elevated at 153  P:   Good UOP off of diuretics o/ last 24 hours HD cath placed 12/1   GASTROINTESTINAL A:   Morbid Obesity - BMI 56 P:     HEMATOLOGIC A:   Anemia - suspect of renal disease, r/o blood loss anemia, B12 and folate wnl  P:  Consider giving 1 unit PRBCs, Hgb 7.1 this AM Heparin for DVT prophylaxis   INFECTIOUS A:   Leukocytosis  LLE Ulcer - present on admission R/o hcap  P:   Zosyn >>> Maintain CT chest w/out acute process or pneumonitis   ENDOCRINE A:   Migrating Arthritis - ? Rheumatoid Arthritis - prior positive labs RF of 19.9 (<13.9) and ANA 1:80, elevated ESR, CRP on 07/16/15 at [facility]). P:   Esr 140 C3, c4, anca, antigbm, ana, dsdna pending ssi Steroids UA not active sed  NEUROLOGIC A:   Metabolic  Encephalopathy - in setting of AKI, hypercarbic / hypoxic respiratory failure.  Exam non-toxic. Mechanical Fall - prior to admit, T&L Spine XRray negative  Weakness / Failure to Thrive  P:   RASS goal: 0 ABg off nimv Avoid benzo  FAMILY  - Updates:  No family at bedside.  - Inter-disciplinary family meet or Palliative Care meeting due  by:  12/8  Attending note to follow.   Albin Felling, MD, MPH Internal Medicine Resident, PGY-II Pager: [phone]   STAFF NOTE: Linwood Dibbles, MD FACP have personally reviewed patient's available data, including medical history, events of note, physical examination and test results as part of my evaluation. I have discussed with resident/NP and other care providers such as pharmacist, RN and RRT. In addition, I personally evaluated patient and elicited key findings of: improved resp sattus,m less crackles, CT chest without pneumonitis, overal is better after lasix, cvp 19 ? Accuracy? Continued to be neg 2 liters , he is awake, attempt to dc daytime BIPAP,if lethargy can restart, ph noted but clinically he is better, pcxr in am , reduce steroids , add full liq, urine output improved, keep hd cath, allow residual autodiuresis The patient is critically ill with multiple organ systems failure and requires high complexity decision making for assessment and support, frequent evaluation and titration of therapies, application of advanced monitoring technologies and extensive interpretation of multiple databases.   Critical Care Time devoted to patient care services described in this note is 30 Minutes. This time reflects time of care of this signee: Merrie Roof, MD FACP. This critical care time does not reflect procedure time, or teaching time or supervisory time of PA/NP/Med student/Med Resident etc but could involve care discussion time. Rest per NP/medical resident whose note is outlined above and that I agree with   Lavon Paganini. Titus Mould, MD, [place] Pgr:  [facility] 08/16/2015 11:34 AM

## 2015-08-16 NOTE — Progress Notes (Signed)
Subjective:  Pt much more alert today, responds to questions, on nasal O2  Objective:  Vital Signs in the last 24 hours: Temp:  [97 F (36.1 C)-99.4 F (37.4 C)] 98.5 F (36.9 C) (12/02 0700) Pulse Rate:  [73-88] 79 (12/02 0800) Resp:  [14-28] 16 (12/02 0800) BP: (85-148)/(57-98) 131/75 mmHg (12/02 0800) SpO2:  [95 %-100 %] 97 % (12/02 0800) FiO2 (%):  [40 %] 40 % (12/02 0700) Weight:  [383 lb (173.728 kg)] 383 lb (173.728 kg) (12/02 0600)  Intake/Output from previous day:  Intake/Output Summary (Last 24 hours) at 08/16/15 0924 Last data filed at 08/16/15 0700  Gross per 24 hour  Intake 578.84 ml  Output   2075 ml  Net -1496.16 ml    Physical Exam: General appearance: cooperative, morbidly obese and lethargic, on Bi Pap Lungs: decreased breath sounds, no obvious wheezing Heart: regular rate and rhythm Extremities: 3+, chronic skin changes Neurologic: Lethargic   Rate: 78  Rhythm: normal sinus rhythm  Lab Results:  Recent Labs  08/15/15 2308 08/16/15 0356  WBC 15.2* 14.3*  HGB 8.1* 7.1*  PLT 330 340    Recent Labs  08/15/15 1829 08/16/15 0356  NA 142 144  K 5.1 4.4  CL 100* 96*  CO2 31 29  GLUCOSE 172* 162*  BUN 106* 105*  CREATININE 5.22* 5.09*   No results for input(s): TROPONINI in the last 72 hours.  Invalid input(s): CK, MB  Recent Labs  08/14/15 1101  INR 1.58*    Scheduled Meds: . antiseptic oral rinse  7 mL Mouth Rinse q12n4p  . chlorhexidine  15 mL Mouth Rinse BID  . Chlorhexidine Gluconate Cloth  6 each Topical Q0600  . heparin  5,000 Units Subcutaneous 3 times per day  . hydrocerin   Topical Daily  . insulin aspart  0-9 Units Subcutaneous 6 times per day  . methylPREDNISolone (SOLU-MEDROL) injection  80 mg Intravenous 3 times per day  . mupirocin ointment  1 application Nasal BID  . piperacillin-tazobactam (ZOSYN)  IV  3.375 g Intravenous Q8H  . sodium chloride  3 mL Intravenous Q12H   Continuous Infusions: . sodium  chloride 10 mL/hr at 08/16/15 [id number]   PRN Meds:.acetaminophen **OR** acetaminophen, alum & mag hydroxide-simeth, heparin, ondansetron **OR** ondansetron (ZOFRAN) IV   Imaging: Ct Head Wo Contrast  08/14/2015  CLINICAL DATA:  Altered mental status following fall earlier today EXAM: CT HEAD WITHOUT CONTRAST   IMPRESSION: Study within normal limits. Electronically Signed   By: Lowella Grip III M.D.   On: 08/14/2015 15:52   US Renal  08/14/2015  CLINICAL DATA:  Chronic kidney disease and hypertension. EXAM: RENAL / URINARY TRACT ULTRASOUND   IMPRESSION: Mild cortical thinning in both kidneys. No hydronephrosis. Electronically Signed   By: Markus Daft M.D.   On: 08/14/2015 07:40   Dg Chest Port 1 View  08/15/2015  CLINICAL DATA:  Acute respiratory failure EXAM: PORTABLE CHEST 1 VIEW COMPARISON:  Yesterday FINDINGS: Chronic cardiopericardial enlargement and vascular pedicle widening. Borderline pulmonary venous congestion; no edema. There is no consolidation, effusion, or pneumothorax. IMPRESSION: 1. No acute finding. 2. Chronic cardiomegaly. Electronically Signed   By: Monte Fantasia M.D.   On: 08/15/2015 07:16     Cardiac Studies: Echo Study Conclusions  - Left ventricle: The cavity size was severely dilated. Wall thickness was increased in a pattern of mild LVH. Systolic function was moderately to severely reduced. The estimated ejection fraction was in the range of 30% to  35%. Doppler parameters are consistent with abnormal left ventricular relaxation (grade 1 diastolic dysfunction). - Impressions: Extremely limited; definity used but LV function still difficult to quantitate; moderate to severe global reduction in LV function; grade 1 diastolic dysfunction; severe LVE; mild LVH. Suggest cardiac MRI or MUGA to better assess LV function.  Impressions:  - Extremely limited; definity used but LV function still difficult to quantitate; moderate to severe  global reduction in LV function; grade 1 diastolic dysfunction; severe LVE; mild LVH. Suggest cardiac MRI or MUGA to better assess LV function.  Assessment/Plan:  54 y.o. AA Male, recently moved her from [facility], with a PMH of morbid obesity (BMI 56), HTN, CHF, NICM, and rheumatoid arthritis (recently diagnosed, not on treatment) who presented to [facility] ED on 08/10/2015 for worsening lower back pain, joint pain, and increased weakness. He reports decreased functional capacity over the past month and had two falls on his day of admission, which prompted him to seek medical attention. His SCr was >4. EF 30% by echo with past history of normal coronaries. After admission he developed respiratory failure with hypercapnia on the floor and was transferred to ICU.   Principal Problem:   Acute respiratory failure with hypoxia and hypercapnia (HCC) Active Problems:   AKI (acute kidney injury) ([place])   Acute on chronic combined systolic and diastolic heart failure (HCC)   Morbid obesity with alveolar hypoventilation (HCC)   Hypertension   Metabolic encephalopathy   Debilitated patient   NICM (nonischemic cardiomyopathy) (HCC)   Arthritis   Stasis ulcer ([place])   Acute respiratory failure ([place])   PLAN: BUN and creatinine rising. BUN may be increased with steroids. Renal following. Diuretics on hold, spontaneous diuresis. Currently on no cardiac medications. Consider transfusion-CCM to see this am.   Kerin Ransom PA-C 08/16/2015, 9:24 AM [phone]  Patient seen and examined and history reviewed. Agree with above findings and plan. Patient is much more alert today. Denies SOB. Good diuresis. No lasix since yesterday. I/O negative 2367 cc yesterday. Weight is down. Edema in legs is much better. Patient was on Coreg, hydralzine, and nitrates but apparently these were discontinued when transferred to unit. Will resume Coreg today and titrate meds gradually as BP allows.   Brynne Doane [place],  [place] 08/16/2015 10:53 AM

## 2015-08-16 NOTE — Progress Notes (Signed)
Assessment:  1 Chronic CKD, suspected stage 5 , azotemia on steroids 2 Volume overload 3 Anemia, iron deficient and ESA deficient-Rx'd 4 LE wounds 5 Morbid obesity 6 Pos Rheumatoid factor 7 Hypertension 8 Hypercarbic resp insuff, off BiPAP 9 Resp Acidosis 10 Hyperkalemia, resolved 11 Anemia  Plan: Observe, spontaneous diuresis When stable will need eval for permanent access May need PRBCs  Subjective: Interval History: Hungry  Objective: Vital signs in last 24 hours: Temp:  [97 F (36.1 C)-99.4 F (37.4 C)] 98.5 F (36.9 C) (12/02 0700) Pulse Rate:  [73-88] 77 (12/02 0740) Resp:  [14-28] 14 (12/02 0740) BP: (85-148)/(57-98) 131/78 mmHg (12/02 0740) SpO2:  [95 %-100 %] 97 % (12/02 0740) FiO2 (%):  [40 %] 40 % (12/02 0700) Weight:  [173.728 kg (383 lb)] 173.728 kg (383 lb) (12/02 0600) Weight change:   Intake/Output from previous day: 12/01 0701 - 12/02 0700 In: 601.8 [P.O.:120; I.V.:331.8; IV Piggyback:150] Out: 2975 [Urine:2975] Intake/Output this shift:    General appearance: alert and cooperative GI: soft, non-tender; bowel sounds normal; no masses,  no organomegaly and protuberant Extremities: edema 2-3+ with chronic changes, less edema Neurologic: positive asterixis  Lab Results:  Recent Labs  08/15/15 2308 08/16/15 0356  WBC 15.2* 14.3*  HGB 8.1* 7.1*  HCT 25.7* 23.4*  PLT 330 340   BMET:  Recent Labs  08/15/15 1829 08/16/15 0356  NA 142 144  K 5.1 4.4  CL 100* 96*  CO2 31 29  GLUCOSE 172* 162*  BUN 106* 105*  CREATININE 5.22* 5.09*  CALCIUM 8.5* 7.9*    Recent Labs  08/14/15 0440  PTH 153*   Iron Studies: No results for input(s): IRON, TIBC, TRANSFERRIN, FERRITIN in the last 72 hours. Studies/Results: Ct Head Wo Contrast  08/14/2015  CLINICAL DATA:  Altered mental status following fall earlier today EXAM: CT HEAD WITHOUT CONTRAST TECHNIQUE: Contiguous axial images were obtained from the base of the skull through the vertex  without intravenous contrast. COMPARISON:  None. FINDINGS: The ventricles are normal in size and configuration. There is no intracranial mass, hemorrhage, extra-axial fluid collection, or midline shift. Gray-white compartments appear normal. No acute infarct evident. Bony calvarium appears intact. The mastoid air cells are clear. No intraorbital lesions are identified. IMPRESSION: Study within normal limits. Electronically Signed   By: Lowella Grip III M.D.   On: 08/14/2015 15:52   Ct Chest Wo Contrast  08/15/2015  CLINICAL DATA:  Dyspnea.  History of CHF and hypertension EXAM: CT CHEST WITHOUT CONTRAST TECHNIQUE: Multidetector CT imaging of the chest was performed following the standard protocol without IV contrast. COMPARISON:  None. FINDINGS: The central airways are patent. There is no focal consolidation. There is mild bibasilar atelectasis. There is no pleural effusion or pneumothorax. There are no pathologically enlarged axillary, hilar or mediastinal lymph nodes. The heart size is normal. There is no pericardial effusion. The thoracic aorta is normal in caliber. Left IJ central venous catheter with the tip projecting over the left brachycephalic vein. Review of bone windows demonstrates no focal lytic or sclerotic lesions. There is a chronic T11 vertebral body compression fracture. Limited non-contrast images of the upper abdomen were obtained. The adrenal glands appear normal. The remainder of the visualized abdominal organs are unremarkable. IMPRESSION: No acute cardiopulmonary process. Electronically Signed   By: Kathreen Devoid   On: 08/15/2015 19:31   Dg Chest Port 1 View  08/16/2015  CLINICAL DATA:  Shortness of breath. EXAM: PORTABLE CHEST 1 VIEW COMPARISON:  CT 08/15/2015.  Chest x-ray 08/15/2015 . FINDINGS: Left IJ line noted in good anatomic position . Cardiomegaly . Mild pulmonary vascular prominence. Mild right base subsegmental atelectasis. Small left pleural effusion cannot be excluded.  No pneumothorax . IMPRESSION: 1. Left IJ line in stable position. 2. Stable prominent cardiomegaly. Mild pulmonary vascular prominence. Small left pleural effusion. 3. Mild right base subsegmental atelectasis. Electronically Signed   By: Marcello Moores  Register   On: 08/16/2015 07:23   Dg Chest Port 1 View  08/15/2015  CLINICAL DATA:  Central catheter placement EXAM: PORTABLE CHEST 1 VIEW COMPARISON:  Study obtained earlier in the day FINDINGS: Central catheter tip is at the junction of the left innominate vein and superior vena cava. No pneumothorax. There is no edema or consolidation. There is stable cardiomegaly. The pulmonary vascularity is within normal limits. No adenopathy. IMPRESSION: Central catheter tip at junction of left innominate vein and superior vena cava. No pneumothorax. Stable cardiomegaly. No edema or consolidation. Electronically Signed   By: Lowella Grip III M.D.   On: 08/15/2015 15:03   Dg Chest Port 1 View  08/15/2015  CLINICAL DATA:  Acute respiratory failure EXAM: PORTABLE CHEST 1 VIEW COMPARISON:  Yesterday FINDINGS: Chronic cardiopericardial enlargement and vascular pedicle widening. Borderline pulmonary venous congestion; no edema. There is no consolidation, effusion, or pneumothorax. IMPRESSION: 1. No acute finding. 2. Chronic cardiomegaly. Electronically Signed   By: Monte Fantasia M.D.   On: 08/15/2015 07:16   Dg Chest Port 1 View  08/14/2015  CLINICAL DATA:  Hypoxia.  Hypertension EXAM: PORTABLE CHEST 1 VIEW COMPARISON:  August 12, 2015 FINDINGS: There is no edema or consolidation. There is cardiomegaly. The pulmonary vascularity is normal. No adenopathy. No bone lesions. IMPRESSION: Stable cardiac enlargement.  No edema or consolidation. Electronically Signed   By: Lowella Grip III M.D.   On: 08/14/2015 11:08    Scheduled: . antiseptic oral rinse  7 mL Mouth Rinse q12n4p  . chlorhexidine  15 mL Mouth Rinse BID  . Chlorhexidine Gluconate Cloth  6 each Topical  Q0600  . heparin  5,000 Units Subcutaneous 3 times per day  . hydrocerin   Topical Daily  . insulin aspart  0-9 Units Subcutaneous 6 times per day  . methylPREDNISolone (SOLU-MEDROL) injection  80 mg Intravenous 3 times per day  . mupirocin ointment  1 application Nasal BID  . piperacillin-tazobactam (ZOSYN)  IV  3.375 g Intravenous Q8H  . sodium chloride  3 mL Intravenous Q12H     LOS: 5 days   Jathan Balling C 08/16/2015,8:43 AM

## 2015-08-16 NOTE — Progress Notes (Signed)
RT removed pt from Bipap to a nasal cannula at 4Lpm. Pt's vitals remained stable. RT will continue to monitor.

## 2015-08-16 NOTE — Progress Notes (Signed)
Pt HD pigtail unable to drawl back blood. Lorrene Reid MD notified and verbal order given to have IV team TPA line. will continue to monitor pt.

## 2015-08-16 NOTE — Progress Notes (Signed)
RT took patient off of BIPAP per MD order and placed on 4L [place]. RT will put patient back on BIPAP in 1 hour.

## 2015-08-16 NOTE — Care Management Important Message (Signed)
Important Message  Patient Details  Name: Dayvid Kuehler MRN: [id number] Date of Birth: 1961-08-03   Medicare Important Message Given:  Yes    Erenest Rasher, RN 08/16/2015, 4:33 PM

## 2015-08-16 NOTE — Progress Notes (Signed)
Physical Therapy Treatment Patient Details Name: Noah Garner MRN: [id number] DOB: 01/04/1961 Today's Date: 08/16/2015    History of Present Illness Noah Garner is a 54 y.o. male with past medical history of morbid obesity, HTN, CHF, and rheumatoid arthritis (recently diagnosed, not on treatment) who presented to [name] on 08/10/2015 for worsening lower back pain, joint pain, and increased weakness. He reports decreased functional capacity over the past month and had two falls on his day of admission, which prompted him to seek medical attention.    PT Comments    Pt denied sitting EOB, stating he was "too tired to make it back to bed." He consented to UE and LE exercises. Prior to exercise, PT noticed that condom cath had fallen out and informed nurse. Will continue to follow. Plan on getting pt to EOB next visit to better assess activity tolerance.   Follow Up Recommendations  SNF     Equipment Recommendations  None recommended by PT    Recommendations for Other Services       Precautions / Restrictions Precautions Precautions: Fall Restrictions Weight Bearing Restrictions: No    Mobility  Bed Mobility               General bed mobility comments: Pt denied sitting on edge of bed, only consented to exercises in supine  Transfers                    Ambulation/Gait                 Stairs            Wheelchair Mobility    Modified Rankin (Stroke Patients Only)       Balance                                    Cognition Arousal/Alertness: Awake/alert Behavior During Therapy: WFL for tasks assessed/performed Overall Cognitive Status: Within Functional Limits for tasks assessed                      Exercises General Exercises - Upper Extremity Shoulder Flexion: AROM;Both;10 reps;Supine Shoulder ABduction: AROM;Both;10 reps Shoulder Horizontal ABduction: AROM;Both;10 reps;Supine General Exercises - Lower  Extremity Quad Sets: AROM;Both;10 reps;Supine Gluteal Sets: AROM;Both;10 reps;Supine Heel Slides: AROM;Both;10 reps;Supine Straight Leg Raises: AROM;5 reps;Both;Supine    General Comments General comments (skin integrity, edema, etc.): Unable to assess balance because pt denied moving from supine      Pertinent Vitals/Pain Pain Assessment: No/denies pain (Only performed supine exercises)    Home Living                      Prior Function            PT Goals (current goals can now be found in the care plan section) Acute Rehab PT Goals Patient Stated Goal: get better. PT Goal Formulation: With patient Time For Goal Achievement: 08/27/15 Potential to Achieve Goals: Fair Progress towards PT goals: Progressing toward goals    Frequency  Min 3X/week    PT Plan Current plan remains appropriate    Co-evaluation             End of Session   Activity Tolerance: Other (comment) (Pt refused to sit EOB, only consented to supine exercises) Patient left: in bed;with call bell/phone within reach     Time: [id number] PT Time  Calculation (min) (ACUTE ONLY): 13 min  Charges:  $Therapeutic Exercise: 8-22 mins                    G Codes:      Haynes Bast August 31, 2015, 4:15 PM Haynes Bast, SPT August 31, 2015 4:15 PM I have read, reviewed and agree with student's note.   [place]onda [phone] (pager)

## 2015-08-17 ENCOUNTER — Inpatient Hospital Stay (HOSPITAL_COMMUNITY): Payer: Medicare Other

## 2015-08-17 ENCOUNTER — Telehealth: Payer: Self-pay | Admitting: Pulmonary Disease

## 2015-08-17 DIAGNOSIS — M1990 Unspecified osteoarthritis, unspecified site: Secondary | ICD-10-CM

## 2015-08-17 DIAGNOSIS — N179 Acute kidney failure, unspecified: Principal | ICD-10-CM

## 2015-08-17 DIAGNOSIS — G9341 Metabolic encephalopathy: Secondary | ICD-10-CM

## 2015-08-17 LAB — GLUCOSE, CAPILLARY
GLUCOSE-CAPILLARY: 120 mg/dL — AB (ref 65–99)
GLUCOSE-CAPILLARY: 128 mg/dL — AB (ref 65–99)
GLUCOSE-CAPILLARY: 150 mg/dL — AB (ref 65–99)
Glucose-Capillary: 202 mg/dL — ABNORMAL HIGH (ref 65–99)
Glucose-Capillary: 207 mg/dL — ABNORMAL HIGH (ref 65–99)

## 2015-08-17 LAB — RENAL FUNCTION PANEL
ALBUMIN: 2.1 g/dL — AB (ref 3.5–5.0)
ANION GAP: 11 (ref 5–15)
BUN: 114 mg/dL — ABNORMAL HIGH (ref 6–20)
CALCIUM: 8.6 mg/dL — AB (ref 8.9–10.3)
CO2: 34 mmol/L — ABNORMAL HIGH (ref 22–32)
Chloride: 101 mmol/L (ref 101–111)
Creatinine, Ser: 4.89 mg/dL — ABNORMAL HIGH (ref 0.61–1.24)
GFR, EST AFRICAN AMERICAN: 14 mL/min — AB (ref >60)
GFR, EST NON AFRICAN AMERICAN: 12 mL/min — AB (ref >60)
Glucose, Bld: 143 mg/dL — ABNORMAL HIGH (ref 65–99)
PHOSPHORUS: 5.1 mg/dL — AB (ref 2.5–4.6)
POTASSIUM: 4.6 mmol/L (ref 3.5–5.1)
Sodium: 146 mmol/L — ABNORMAL HIGH (ref 135–145)

## 2015-08-17 LAB — MAGNESIUM: Magnesium: 2.2 mg/dL (ref 1.7–2.4)

## 2015-08-17 LAB — CBC
HEMATOCRIT: 26.1 % — AB (ref 39.0–52.0)
HEMOGLOBIN: 8 g/dL — AB (ref 13.0–17.0)
MCH: 25.2 pg — ABNORMAL LOW (ref 26.0–34.0)
MCHC: 30.7 g/dL (ref 30.0–36.0)
MCV: 82.1 fL (ref 78.0–100.0)
Platelets: 367 10*3/uL (ref 150–400)
RBC: 3.18 MIL/uL — AB (ref 4.22–5.81)
RDW: 16.7 % — AB (ref 11.5–15.5)
WBC: 15.7 10*3/uL — AB (ref 4.0–10.5)

## 2015-08-17 MED ORDER — HYDRALAZINE HCL 25 MG PO TABS
25.0000 mg | ORAL_TABLET | Freq: Three times a day (TID) | ORAL | Status: DC
  Administered 2015-08-17 – 2015-08-20 (×10): 25 mg via ORAL
  Filled 2015-08-17 (×11): qty 1

## 2015-08-17 MED ORDER — FUROSEMIDE 10 MG/ML IJ SOLN
INTRAMUSCULAR | Status: AC
  Filled 2015-08-17: qty 4

## 2015-08-17 MED ORDER — FUROSEMIDE 10 MG/ML IJ SOLN
80.0000 mg | Freq: Once | INTRAMUSCULAR | Status: AC
  Administered 2015-08-17: 80 mg via INTRAVENOUS
  Filled 2015-08-17: qty 8

## 2015-08-17 NOTE — Progress Notes (Signed)
Purple port in trialysis catheter has no blood return but flushes well.  Unable to obtain CVP reading.  Discussed with Brandi, NP and CVP readings are discontinued.

## 2015-08-17 NOTE — Progress Notes (Signed)
       Patient Name: Noah Garner Date of Encounter: 08/17/2015    SUBJECTIVE: The patient feels better and that progress is being made. Breathing is improved.  TELEMETRY:  Normal sinus rhythm Filed Vitals:   08/17/15 0500 08/17/15 0600 08/17/15 0819 08/17/15 0830  BP: 144/80 132/73  135/80  Pulse: 73 72  75  Temp:   97.7 F (36.5 C)   TempSrc:   Oral   Resp: 16 17    Height:      Weight: 384 lb (174.181 kg)     SpO2: 100% 99%      Intake/Output Summary (Last 24 hours) at 08/17/15 0844 Last data filed at 08/17/15 0830  Gross per 24 hour  Intake   1209 ml  Output   1651 ml  Net   -442 ml   LABS: Basic Metabolic Panel:  Recent Labs  08/16/15 0356 08/17/15 0232  NA 144 146*  K 4.4 4.6  CL 96* 101  CO2 29 34*  GLUCOSE 162* 143*  BUN 105* 114*  CREATININE 5.09* 4.89*  CALCIUM 7.9* 8.6*  MG 2.0 2.2  PHOS 6.3* 5.1*   CBC:  Recent Labs  08/15/15 2308 08/16/15 0356 08/17/15 0232  WBC 15.2* 14.3* 15.7*  NEUTROABS 13.1*  --   --   HGB 8.1* 7.1* 8.0*  HCT 25.7* 23.4* 26.1*  MCV 82.1 81.8 82.1  PLT 330 340 367     Radiology/Studies:  Chest x-ray 08/16/15 IMPRESSION: 1. Left IJ line in stable position. 2. Stable prominent cardiomegaly. Mild pulmonary vascular prominence. Small left pleural effusion. 3. Mild right base subsegmental atelectasis.  Physical Exam: Blood pressure 135/80, pulse 75, temperature 97.7 F (36.5 C), temperature source Oral, resp. rate 17, height 5\' 11"  (1.803 m), weight 384 lb (174.181 kg), SpO2 99 %. Weight change: 1 lb (0.454 kg)  Wt Readings from Last 3 Encounters:  08/17/15 384 lb (174.181 kg)    Morbidly obese Unable to evaluate neck veins due to marked obesity Chest is clear anteriorly Cardiac exam reveals regular rhythm without obvious murmur or gallop\ Marked abdominal obesity Lower extremity edema  ASSESSMENT:  1. Acute on chronic combined systolic and diastolic heart failure improving with diuresis. 2. Acute  on chronic kidney injury with azotemia. Slight improvement in kidney function this morning 3. Hypertension, essential, and currently well controlled 4. Morbid obesity and likely sleep apnea  Plan:  1. We'll likely need additional diuresis with Lasix. 2. Twelve-lead ECG 3. Add hydralazine and long-acting nitrates as tolerated by BP  Signed, Sinclair Grooms 08/17/2015, 8:44 AM

## 2015-08-17 NOTE — Progress Notes (Signed)
Assessment:  1 Chronic CKD, suspected stage 5 , azotemia on steroids 2 Volume overload 3 Anemia, iron deficient and ESA deficient-Rx'd 4 LE wounds 5 Morbid obesity 6 Pos Rheumatoid factor 7 Hypertension 8 Symptomatic-Hypercarbic resp insuff, off BiPAP 9 Resp Acidosis 10 Hyperkalemia, resolved 11 Anemia  Renal Plan: Observe When stable will need eval for permanent access   Subjective: Interval History: Nothing new  Objective: Vital signs in last 24 hours: Temp:  [97.1 F (36.2 C)-98.5 F (36.9 C)] 98.4 F (36.9 C) (12/03 0400) Pulse Rate:  [67-86] 72 (12/03 0600) Resp:  [15-20] 17 (12/03 0600) BP: (111-159)/(70-85) 132/73 mmHg (12/03 0600) SpO2:  [94 %-100 %] 99 % (12/03 0600) FiO2 (%):  [40 %] 40 % (12/03 0200) Weight:  [174.181 kg (384 lb)] 174.181 kg (384 lb) (12/03 0500) Weight change: 0.454 kg (1 lb)  Intake/Output from previous day: 12/02 0701 - 12/03 0700 In: 1216 [P.O.:1140; I.V.:76] Out: 1651 [Urine:1651] Intake/Output this shift:    General appearance: slowed mentation Chest wall: no tenderness GI: soft, non-tender; bowel sounds normal; no masses,  no organomegaly Extremities: edema decreasing edema with wrinkles  Pos asterixis  Lab Results:  Recent Labs  08/16/15 0356 08/17/15 0232  WBC 14.3* 15.7*  HGB 7.1* 8.0*  HCT 23.4* 26.1*  PLT 340 367   BMET:  Recent Labs  08/16/15 0356 08/17/15 0232  NA 144 146*  K 4.4 4.6  CL 96* 101  CO2 29 34*  GLUCOSE 162* 143*  BUN 105* 114*  CREATININE 5.09* 4.89*  CALCIUM 7.9* 8.6*   No results for input(s): PTH in the last 72 hours. Iron Studies: No results for input(s): IRON, TIBC, TRANSFERRIN, FERRITIN in the last 72 hours. Studies/Results: Ct Chest Wo Contrast  08/15/2015  CLINICAL DATA:  Dyspnea.  History of CHF and hypertension EXAM: CT CHEST WITHOUT CONTRAST TECHNIQUE: Multidetector CT imaging of the chest was performed following the standard protocol without IV contrast. COMPARISON:   None. FINDINGS: The central airways are patent. There is no focal consolidation. There is mild bibasilar atelectasis. There is no pleural effusion or pneumothorax. There are no pathologically enlarged axillary, hilar or mediastinal lymph nodes. The heart size is normal. There is no pericardial effusion. The thoracic aorta is normal in caliber. Left IJ central venous catheter with the tip projecting over the left brachycephalic vein. Review of bone windows demonstrates no focal lytic or sclerotic lesions. There is a chronic T11 vertebral body compression fracture. Limited non-contrast images of the upper abdomen were obtained. The adrenal glands appear normal. The remainder of the visualized abdominal organs are unremarkable. IMPRESSION: No acute cardiopulmonary process. Electronically Signed   By: Kathreen Devoid   On: 08/15/2015 19:31   Dg Chest Port 1 View  08/16/2015  CLINICAL DATA:  Shortness of breath. EXAM: PORTABLE CHEST 1 VIEW COMPARISON:  CT 08/15/2015.  Chest x-ray 08/15/2015 . FINDINGS: Left IJ line noted in good anatomic position . Cardiomegaly . Mild pulmonary vascular prominence. Mild right base subsegmental atelectasis. Small left pleural effusion cannot be excluded. No pneumothorax . IMPRESSION: 1. Left IJ line in stable position. 2. Stable prominent cardiomegaly. Mild pulmonary vascular prominence. Small left pleural effusion. 3. Mild right base subsegmental atelectasis. Electronically Signed   By: Marcello Moores  Register   On: 08/16/2015 07:23   Dg Chest Port 1 View  08/15/2015  CLINICAL DATA:  Central catheter placement EXAM: PORTABLE CHEST 1 VIEW COMPARISON:  Study obtained earlier in the day FINDINGS: Central catheter tip is at the junction of  the left innominate vein and superior vena cava. No pneumothorax. There is no edema or consolidation. There is stable cardiomegaly. The pulmonary vascularity is within normal limits. No adenopathy. IMPRESSION: Central catheter tip at junction of left  innominate vein and superior vena cava. No pneumothorax. Stable cardiomegaly. No edema or consolidation. Electronically Signed   By: Lowella Grip III M.D.   On: 08/15/2015 15:03    Scheduled: . antiseptic oral rinse  7 mL Mouth Rinse q12n4p  . carvedilol  6.25 mg Oral BID WC  . chlorhexidine  15 mL Mouth Rinse BID  . Chlorhexidine Gluconate Cloth  6 each Topical Q0600  . heparin  5,000 Units Subcutaneous 3 times per day  . hydrocerin   Topical Daily  . insulin aspart  0-9 Units Subcutaneous 6 times per day  . methylPREDNISolone (SOLU-MEDROL) injection  40 mg Intravenous Q12H  . mupirocin ointment  1 application Nasal BID  . sodium chloride  3 mL Intravenous Q12H    LOS: 6 days   Noah Garner C 08/17/2015,8:03 AM

## 2015-08-17 NOTE — Clinical Social Work Placement (Deleted)
   CLINICAL SOCIAL WORK PLACEMENT  NOTE  Date:  08/17/2015  Patient Details  Name: Noah Garner MRN: [id number] Date of Birth: 05-14-1961  Clinical Social Work is seeking post-discharge placement for this patient at the [place] level of care (*CSW will initial, date and re-position this form in  chart as items are completed):  Yes   Patient/family provided with [place] Work Department's list of facilities offering this level of care within the geographic area requested by the patient (or if unable, by the patient's family).  Yes   Patient/family informed of their freedom to choose among providers that offer the needed level of care, that participate in Medicare, Medicaid or managed care program needed by the patient, have an available bed and are willing to accept the patient.  Yes   Patient/family informed of 's ownership interest in [facility] and [facility], as well as of the fact that they are under no obligation to receive care at these facilities.  PASRR submitted to EDS on 08/17/15     PASRR number received on 08/17/15     Existing PASRR number confirmed on       FL2 transmitted to all facilities in geographic area requested by pt/family on 08/17/15     FL2 transmitted to all facilities within larger geographic area on       Patient informed that his/her managed care company has contracts with or will negotiate with certain facilities, including the following:            Patient/family informed of bed offers received.  Patient chooses bed at       Physician recommends and patient chooses bed at      Patient to be transferred to   on  .  Patient to be transferred to facility by       Patient family notified on   of transfer.  Name of family member notified:        PHYSICIAN       Additional Comment:    _______________________________________________ Matilde Bash, [place] 08/17/2015, 3:21 PM

## 2015-08-17 NOTE — Telephone Encounter (Signed)
Duplicate, entered in error.  Noe Gens, NP-C [facility] Pgr: [phone] or if no answer [phone] 08/17/2015, 2:53 PM

## 2015-08-17 NOTE — Clinical Social Work Note (Signed)
Clinical Social Work Assessment  Patient Details  Name: Noah Garner MRN: [id number] Date of Birth: 09-Jun-1961  Date of referral:  08/17/15               Reason for consult:  Facility Placement                Permission sought to share information with:  Chartered certified accountant granted to share information::  Yes, Verbal Permission Granted  Name::      Elza Rafter  Agency::  [facility] SNFs  Relationship::   friend  Contact Information:     Housing/Transportation Living arrangements for the past 2 months:  [place] of Information:  Patient Patient Interpreter Needed:  None Criminal Activity/Legal Involvement Pertinent to Current Situation/Hospitalization:  No - Comment as needed Significant Relationships:  Friend Lives with:  Relatives Do you feel safe going back to the place where you live?  Yes Need for family participation in patient care:  No (Coment)  Care giving concerns:none   Social Worker assessment / plan:  Pt stated that he really isn't sure where he will go upon d/c.  Pt currently lives with family but understands that he may need rehab at d/c.  Pt is agreeable to SNF placement, if need be.  Pt agreed to SNF search in [facility].  Employment status:  Retired Forensic scientist:  Medicare PT Recommendations:  Not assessed at this time Information / Referral to community resources:     Patient/Family's Response to care: Pt not sure about d/c needs but stated that he will go to SNF, if need be.  Patient/Family's Understanding of and Emotional Response to Diagnosis, Current Treatment, and Prognosis:  Pt felt ok about going to SNF, if warranted.  Emotional Assessment Appearance:  Appears stated age Attitude/Demeanor/Rapport:   (calm and cooperative) Affect (typically observed):  Calm Orientation:  Oriented to Self, Oriented to Place, Oriented to  Time, Oriented to Situation Alcohol / Substance use:  Never  Used Psych involvement (Current and /or in the community):  No (Comment)  Discharge Needs  Concerns to be addressed:  No discharge needs identified Readmission within the last 30 days:  No Current discharge risk:  None Barriers to Discharge:  No Barriers Identified   Matilde Bash, [place] 08/17/2015, 3:17 PM

## 2015-08-17 NOTE — Clinical Social Work Placement (Signed)
   CLINICAL SOCIAL WORK PLACEMENT  NOTE  Date:  08/17/2015  Patient Details  Name: Noah Garner MRN: [id number] Date of Birth: [birth year]  Clinical Social Work is seeking post-discharge placement for this patient at the [place] level of care (*CSW will initial, date and re-position this form in  chart as items are completed):  Yes   Patient/family provided with [place] Work Department's list of facilities offering this level of care within the geographic area requested by the patient (or if unable, by the patient's family).  Yes   Patient/family informed of their freedom to choose among providers that offer the needed level of care, that participate in Medicare, Medicaid or managed care program needed by the patient, have an available bed and are willing to accept the patient.  Yes   Patient/family informed of [place]'s ownership interest in [facility] and [facility], as well as of the fact that they are under no obligation to receive care at these facilities.  PASRR submitted to EDS on 08/17/15     PASRR number received on 08/17/15     Existing PASRR number confirmed on       FL2 transmitted to all facilities in geographic area requested by pt/family on 08/17/15     FL2 transmitted to all facilities within larger geographic area on       Patient informed that his/her managed care company has contracts with or will negotiate with certain facilities, including the following:            Patient/family informed of bed offers received.  Patient chooses bed at       Physician recommends and patient chooses bed at      Patient to be transferred to   on  .  Patient to be transferred to facility by       Patient family notified on   of transfer.  Name of family member notified:        PHYSICIAN       Additional Comment:    _______________________________________________ Matilde Bash, [place] 08/17/2015, 3:19 PM

## 2015-08-17 NOTE — Progress Notes (Signed)
RT placed patient on BIPAP for the night per MD order.

## 2015-08-17 NOTE — Progress Notes (Signed)
PULMONARY / CRITICAL CARE MEDICINE   Name: Noah Garner MRN: [id number] DOB: Feb 13, 1961    ADMISSION DATE:  08/10/2015 CONSULTATION DATE:  08/14/15  REFERRING MD :  Dr. Sheran Fava   CHIEF COMPLAINT:  Hypercarbic Respiratory Failure  SUBJECTIVE:  Pt reports feeling better, breathing easier.  Net neg 435 in past 24 hours.    VITAL SIGNS: BP 131/73 mmHg  Pulse 87  Temp(Src) 98 F (36.7 C) (Axillary)  Resp 17  Ht $R'5\' 11"'tY$  (1.803 m)  Wt 384 lb (174.181 kg)  BMI 53.58 kg/m2  SpO2 98%  HEMODYNAMICS:    VENTILATOR SETTINGS: Vent Mode:  [-] BIPAP FiO2 (%):  [40 %] 40 % Set Rate:  [16 bmp] 16 bmp  INTAKE / OUTPUT: I/O last 3 completed shifts: In: 1360.7 [P.O.:1140; I.V.:120.7; IV Piggyback:100] Out: 2926 [Urine:2926]  PHYSICAL EXAMINATION: General:  Obese man resting in bed, NAD Neuro: alert, follows commands, OFF NIMV HEENT:  [place]/AT, EOMI, mucus membranes moist  Cardiovascular:  s1s2 distant, regular  Lungs: even/non-labored, lungs bilaterally distant but clear  Abdomen: BS+, soft, obese, non-tender  Musculoskeletal:  No acute deformities Skin: 2+ edema in lower extremities   LABS:  CBC  Recent Labs Lab 08/15/15 2308 08/16/15 0356 08/17/15 0232  WBC 15.2* 14.3* 15.7*  HGB 8.1* 7.1* 8.0*  HCT 25.7* 23.4* 26.1*  PLT 330 340 367   Coag's  Recent Labs Lab 08/14/15 1101  APTT 34  INR 1.58*   BMET  Recent Labs Lab 08/15/15 1829 08/16/15 0356 08/17/15 0232  NA 142 144 146*  K 5.1 4.4 4.6  CL 100* 96* 101  CO2 31 29 34*  BUN 106* 105* 114*  CREATININE 5.22* 5.09* 4.89*  GLUCOSE 172* 162* 143*   Electrolytes  Recent Labs Lab 08/15/15 0302 08/15/15 1829 08/16/15 0356 08/17/15 0232  CALCIUM 8.8* 8.5* 7.9* 8.6*  MG 2.2  --  2.0 2.2  PHOS 8.6*  --  6.3* 5.1*   Sepsis Markers  Recent Labs Lab 08/14/15 1101 08/14/15 1230 08/14/15 2032 08/15/15 0302 08/16/15 0356  LATICACIDVEN 0.6 0.6  --   --   --   PROCALCITON 2.46  --  2.51 2.51 1.43    ABG  Recent Labs Lab 08/14/15 1420 08/14/15 1806 08/15/15 1606  PHART 7.285* 7.307* 7.260*  PCO2ART 57.7* 59.7* 73.4*  PO2ART 175* 172.0* 129.0*   Liver Enzymes  Recent Labs Lab 08/10/15 1925 08/14/15 0440 08/15/15 0302 08/16/15 0356 08/17/15 0232  AST 49* 27  --   --   --   ALT 28 25  --   --   --   ALKPHOS 67 70  --   --   --   BILITOT 0.8 1.0  --   --   --   ALBUMIN 2.7* 2.3* 2.3* 1.9* 2.1*   Cardiac Enzymes No results for input(s): TROPONINI, PROBNP in the last 168 hours.   Glucose  Recent Labs Lab 08/16/15 1616 08/16/15 2040 08/16/15 2336 08/17/15 0429 08/17/15 0817 08/17/15 1227  GLUCAP 157* 212* 150* 120* 128* 202*    Imaging Dg Chest Port 1 View  08/17/2015  CLINICAL DATA:  Pulmonary edema. History of congestive heart failure and hypertension. EXAM: PORTABLE CHEST 1 VIEW COMPARISON:  Radiographs 08/16/2015.  CT 08/15/2015. FINDINGS: 0530 hours. Left IJ central venous catheter is unchanged at the level of the confluence of the brachiocephalic veins. There is stable cardiomegaly and vascular congestion. Basilar aeration has minimally worsened. There is no confluent airspace opacity or significant pleural effusion. The bones  appear unchanged. IMPRESSION: Minimal worsening of bibasilar aeration.  Stable cardiomegaly. Electronically Signed   By: Richardean Sale M.D.   On: 08/17/2015 09:12     STUDIES:  11/29  ECHO >> severe LV dilation, mild LVH, moderate to severe reduction of systolic dysfunction with an LVEF of 42-35%, grade 1 diastolic dysfunction 36/14  Renal US >> mild cortical thinning in both kidneys, no hydronephrosis.  12/1 CT Chest>> mild bibasilar atelectasis, no evidence of pneumonitis   CULTURES: 11/30  MRSA PCR >> positive  11/30  BCx2 >>   ANTIBIOTICS: Zosyn 11/30 >> 12/2  SIGNIFICANT EVENTS: 11/26  Admit with weakness, swelling, falls.  Found to have AKI, anemia, volume overload.  LINES/TUBES: Left IJ HD cath  12/1>>>  DISCUSSION: 54 y/o M with PMH of HTN, CHF and new recent diagnosis of rheumatoid arthritis who presented to [facility] on 11/26 with 2 month history of progressive weakness, joint pain, falls, weight gain and incontinence.  The patient was found to have anemia, AKI, acute on chronic CHF exacerbation.  He later (11/30) developed hypercarbic / hypoxic respiratory failure requiring bipap in the setting of combined CHF.  Improved with diuresis.      ASSESSMENT / PLAN:  PULMONARY A: Acute hypercarbic / hypoxic respiratory failure - in setting of AKI, decompensated CHF, +/- autoimmune process with weakness, untreated OSA   P:   Diuretics on hold  CT chest w/out evidence of pneumonitis  Breathing comfortably on [place] Continue empiric steroids   CARDIOVASCULAR A:  Acute on Chronic Systolic CHF  Uncontrolled HTN   P:  Transfer to 3W 12/3 Hydralazine added 12/3 per Cardiology Cardiology following  RENAL A:   Acute on Chronic Kidney Disease - previously seen by Nephrology in [facility], unknown baseline sr cr   Hyperphosphatemia- likely due to secondary hyperparathyroidism, PTH elevated at 153  P:   Monitor BMP / UOP  Replace electrolytes as indicated Will need eval for permanent access   GASTROINTESTINAL A:   Morbid Obesity - BMI 56 P:   Renal / carb modified diet as tolerated   HEMATOLOGIC A:   Anemia - suspect of renal disease, r/o blood loss anemia, B12 and folate wnl  P:  Trend CBC  Heparin for DVT prophylaxis   INFECTIOUS A:   Leukocytosis  LLE Ulcer - present on admission  P:   CT chest w/out acute process or pneumonitis  Monitor off abx   ENDOCRINE A:   Migrating Arthritis - ? Rheumatoid Arthritis - prior positive labs RF of 19.9 (<13.9) and ANA 1:80, elevated ESR, CRP on 07/16/15 at [facility]). P:   Esr 140 C3 181, c4 35, C/P ANCA neg, antigbm neg, ana neg, dsdna negative SSI Steroids 40 mg IV Q12, consider slow reduction to lowest dose possible.    Will need Rheumatology follow up  NEUROLOGIC A:   Metabolic Encephalopathy - in setting of AKI, hypercarbic / hypoxic respiratory failure.  Resolved.  Mechanical Fall - prior to admit, T&L Spine XRray negative  Weakness / Failure to Thrive  P:   RASS goal: 0 Avoid benzo PT efforts   FAMILY  - Updates:  No family at bedside 12/3.  Patient updated on plan of care.   - Inter-disciplinary family meet or Palliative Care meeting due by:  12/8   Transfer to 3W and back to [facility] for am 12/4.     Noe Gens, NP-C [facility] Pgr: [phone] or if no answer [phone] 08/17/2015, 3:13  PM

## 2015-08-17 NOTE — Progress Notes (Signed)
Patient refused bipap at this time. Patient is resting comfortably on 4L [place] with O2 sat 99%. RT will continue to monitor.

## 2015-08-17 NOTE — Progress Notes (Signed)
RT took patient off BiPAP and placed on 4L [place].

## 2015-08-17 NOTE — NC FL2 (Signed)
[place] LEVEL OF CARE SCREENING TOOL     IDENTIFICATION  Patient Name: Noah Garner Birthdate: February 25, 1961 Sex: male Admission Date (Current Location): 08/10/2015  [facility] and [place] Number: Herbalist and Address:  [facility] [place], [place] [address], [place], [place] [address]      Provider Number: [id number]  Attending Physician Name and Address:  Raylene Miyamoto, MD  Relative Name and Phone Number:       Current Level of Care: SNF Recommended Level of Care: [place] Prior Approval Number:    Date Approved/Denied:   PASRR Number: [id number]  Discharge Plan: SNF    Current Diagnoses: Patient Active Problem List   Diagnosis Date Noted  . Encounter for central line placement   . Fall at home   . Morbid obesity with alveolar hypoventilation ([place]) 08/15/2015  . Debilitated patient 08/15/2015  . NICM (nonischemic cardiomyopathy) ([place]) 08/15/2015  . Acute respiratory failure ([place])   . Acute respiratory failure with hypoxia and hypercapnia ([place]) 08/14/2015  . Metabolic encephalopathy [id number]  . AKI (acute kidney injury) ([place]) 08/11/2015  . Acute on chronic combined systolic and diastolic heart failure ([place]) 08/11/2015  . Hypertension 08/11/2015  . Arthritis 08/11/2015  . Stasis ulcer ([place]) 08/11/2015  . Eye contusion 08/11/2015  . Acute back pain   . Weakness of both legs   . Falls     Orientation ACTIVITIES/SOCIAL BLADDER RESPIRATION    Self, Time, Situation, Place  Active Continent    BEHAVIORAL SYMPTOMS/MOOD NEUROLOGICAL BOWEL NUTRITION STATUS      Continent  (renal/carb modified)  PHYSICIAN VISITS COMMUNICATION OF NEEDS Height & Weight Skin    Verbally   384 lbs.  (Left lower Leg Ulcer which he reports has been there for 3-4 weeks)          AMBULATORY STATUS RESPIRATION    Assist extensive        Personal Care Assistance Level of Assistance  Bathing, Dressing Bathing Assistance:  Limited assistance   Dressing Assistance: Limited assistance      Functional Limitations Info                SPECIAL CARE FACTORS FREQUENCY                      Additional Factors Info                  Current Medications (08/17/2015):  This is the current hospital active medication list Current Facility-Administered Medications  Medication Dose Route Frequency Provider Last Rate Last Dose  . 0.9 %  sodium chloride infusion   Intravenous Continuous Janece Canterbury, MD 10 mL/hr at 08/16/15 [phone]    . acetaminophen (TYLENOL) tablet 650 mg  650 mg Oral Q6H PRN Theressa Millard, MD       Or  . acetaminophen (TYLENOL) suppository 650 mg  650 mg Rectal Q6H PRN Theressa Millard, MD      . alum & mag hydroxide-simeth (MAALOX/MYLANTA) 200-200-20 MG/5ML suspension 30 mL  30 mL Oral Q6H PRN Theressa Millard, MD      . antiseptic oral rinse (CPC / CETYLPYRIDINIUM CHLORIDE 0.05%) solution 7 mL  7 mL Mouth Rinse q12n4p Janece Canterbury, MD   7 mL at 08/16/15 1725  . carvedilol (COREG) tablet 6.25 mg  6.25 mg Oral BID WC Peter M Martinique, MD   6.25 mg at 08/17/15 0830  . chlorhexidine (PERIDEX) 0.12 % solution 15  mL  15 mL Mouth Rinse BID Janece Canterbury, MD   15 mL at 08/16/15 2248  . Chlorhexidine Gluconate Cloth 2 % PADS 6 each  6 each Topical Q0600 Janece Canterbury, MD   6 each at 08/17/15 0500  . heparin injection 1,000-6,000 Units  1,000-6,000 Units CRRT PRN Raylene Miyamoto, MD   2,800 Units at 08/15/15 1509  . heparin injection 5,000 Units  5,000 Units Subcutaneous 3 times per day Theressa Millard, MD   5,000 Units at 08/17/15 1313  . hydrALAZINE (APRESOLINE) tablet 25 mg  25 mg Oral 3 times per day Belva Crome, MD   25 mg at 08/17/15 1108  . hydrocerin (EUCERIN) cream   Topical Daily Nita Sells, MD      . insulin aspart (novoLOG) injection 0-9 Units  0-9 Units Subcutaneous 6 times per day Juliet Rude, MD   3 Units at 08/17/15 1313  . methylPREDNISolone  sodium succinate (SOLU-MEDROL) 40 mg/mL injection 40 mg  40 mg Intravenous Q12H Raylene Miyamoto, MD   40 mg at 08/17/15 0501  . mupirocin ointment (BACTROBAN) 2 % 1 application  1 application Nasal BID Janece Canterbury, MD   1 application at [phone]  . ondansetron (ZOFRAN) tablet 4 mg  4 mg Oral Q6H PRN Theressa Millard, MD       Or  . ondansetron (ZOFRAN) injection 4 mg  4 mg Intravenous Q6H PRN Theressa Millard, MD      . sodium chloride 0.9 % injection 3 mL  3 mL Intravenous Q12H Theressa Millard, MD   3 mL at 08/17/15 1000     Discharge Medications: Please see discharge summary for a list of discharge medications.  Relevant Imaging Results:  Relevant Lab Results:  Recent Labs    Additional Information    Matilde Bash, LCSW

## 2015-08-18 DIAGNOSIS — R5381 Other malaise: Secondary | ICD-10-CM

## 2015-08-18 DIAGNOSIS — M069 Rheumatoid arthritis, unspecified: Secondary | ICD-10-CM | POA: Insufficient documentation

## 2015-08-18 DIAGNOSIS — M0579 Rheumatoid arthritis with rheumatoid factor of multiple sites without organ or systems involvement: Secondary | ICD-10-CM

## 2015-08-18 DIAGNOSIS — I83029 Varicose veins of left lower extremity with ulcer of unspecified site: Secondary | ICD-10-CM

## 2015-08-18 LAB — RENAL FUNCTION PANEL
ALBUMIN: 2.1 g/dL — AB (ref 3.5–5.0)
ANION GAP: 9 (ref 5–15)
BUN: 109 mg/dL — ABNORMAL HIGH (ref 6–20)
CALCIUM: 8.6 mg/dL — AB (ref 8.9–10.3)
CO2: 33 mmol/L — ABNORMAL HIGH (ref 22–32)
CREATININE: 4.28 mg/dL — AB (ref 0.61–1.24)
Chloride: 102 mmol/L (ref 101–111)
GFR calc non Af Amer: 14 mL/min — ABNORMAL LOW (ref >60)
GFR, EST AFRICAN AMERICAN: 17 mL/min — AB (ref >60)
GLUCOSE: 138 mg/dL — AB (ref 65–99)
PHOSPHORUS: 3.7 mg/dL (ref 2.5–4.6)
Potassium: 4.7 mmol/L (ref 3.5–5.1)
SODIUM: 144 mmol/L (ref 135–145)

## 2015-08-18 LAB — GLUCOSE, CAPILLARY
GLUCOSE-CAPILLARY: 138 mg/dL — AB (ref 65–99)
GLUCOSE-CAPILLARY: 143 mg/dL — AB (ref 65–99)
GLUCOSE-CAPILLARY: 148 mg/dL — AB (ref 65–99)
Glucose-Capillary: 147 mg/dL — ABNORMAL HIGH (ref 65–99)
Glucose-Capillary: 131 mg/dL — ABNORMAL HIGH (ref 65–99)
Glucose-Capillary: 147 mg/dL — ABNORMAL HIGH (ref 65–99)
Glucose-Capillary: 198 mg/dL — ABNORMAL HIGH (ref 65–99)

## 2015-08-18 LAB — CBC
HEMATOCRIT: 25.8 % — AB (ref 39.0–52.0)
HEMOGLOBIN: 7.7 g/dL — AB (ref 13.0–17.0)
MCH: 24.8 pg — ABNORMAL LOW (ref 26.0–34.0)
MCHC: 29.8 g/dL — ABNORMAL LOW (ref 30.0–36.0)
MCV: 83 fL (ref 78.0–100.0)
Platelets: 335 10*3/uL (ref 150–400)
RBC: 3.11 MIL/uL — ABNORMAL LOW (ref 4.22–5.81)
RDW: 17 % — ABNORMAL HIGH (ref 11.5–15.5)
WBC: 14.2 10*3/uL — AB (ref 4.0–10.5)

## 2015-08-18 LAB — MAGNESIUM: Magnesium: 2.1 mg/dL (ref 1.7–2.4)

## 2015-08-18 MED ORDER — ISOSORBIDE MONONITRATE ER 30 MG PO TB24
30.0000 mg | ORAL_TABLET | Freq: Every day | ORAL | Status: DC
  Administered 2015-08-18 – 2015-08-20 (×3): 30 mg via ORAL
  Filled 2015-08-18 (×3): qty 1

## 2015-08-18 NOTE — Progress Notes (Addendum)
       Patient Name: Noah Garner Date of Encounter: 08/18/2015    SUBJECTIVE: Awake, alert, and feeling better.  TELEMETRY:  Normal sinus rhythm Filed Vitals:   08/18/15 0100 08/18/15 0352 08/18/15 0400 08/18/15 0733  BP: 137/68 137/72 151/90 134/73  Pulse: 80 84 82 80  Temp:      TempSrc:      Resp: 16 17 19    Height:      Weight:      SpO2: 97% 98% 99%     Intake/Output Summary (Last 24 hours) at 08/18/15 0741 Last data filed at 08/18/15 0500  Gross per 24 hour  Intake   2343 ml  Output   2585 ml  Net   -242 ml   LABS: Basic Metabolic Panel:  Recent Labs  08/17/15 0232 08/18/15 0335  NA 146* 144  K 4.6 4.7  CL 101 102  CO2 34* 33*  GLUCOSE 143* 138*  BUN 114* 109*  CREATININE 4.89* 4.28*  CALCIUM 8.6* 8.6*  MG 2.2 2.1  PHOS 5.1* 3.7   CBC:  Recent Labs  08/15/15 2308  08/17/15 0232 08/18/15 0335  WBC 15.2*  < > 15.7* 14.2*  NEUTROABS 13.1*  --   --   --   HGB 8.1*  < > 8.0* 7.7*  HCT 25.7*  < > 26.1* 25.8*  MCV 82.1  < > 82.1 83.0  PLT 330  < > 367 335  < > = values in this interval not displayed. ECG: Normal sinus rhythm with prominent voltage and early repolarization in precordial leads  Radiology/Studies:  Bibasally air space disease  Physical Exam: Blood pressure 134/73, pulse 80, temperature 97.6 F (36.4 C), temperature source Oral, resp. rate 19, height 5\' 11"  (1.803 m), weight 384 lb (174.181 kg), SpO2 99 %. Weight change:   Wt Readings from Last 3 Encounters:  08/17/15 384 lb (174.181 kg)    Morbid obesity Difficult to assess neck veins Persistent bilateral lower extremity edema with chronic skin changes S4 gallop on auscultation  ASSESSMENT:  1. Acute on chronic combined systolic and diastolic heart failure. 2. Acute on chronic kidney disease with severe azotemia. Kidney function is gradually improving. 3. Essential hypertension with good blood pressure control  Plan:  Probably needs further diuresis but will await  input from nephrology Hydralazine and long-acting nitrates have been added need to be optimized.  Demetrios Isaacs 08/18/2015, 7:41 AM

## 2015-08-18 NOTE — Progress Notes (Signed)
Pt transferred from another unit. His bedside black box BIPAP was not transferred with him. New unit setup with ordered settings: IPAP: 15/ EPAP: 8. 2L O2 bled in through [facility]. Pt resting at this time.

## 2015-08-18 NOTE — Progress Notes (Signed)
[facility] PROGRESS NOTE  Noah Garner SJG:[id number] DOB: 06/15/1961 DOA: 08/10/2015 PCP: Nilda Simmer, MD  Brief Summary  The patient is a 54 year old male with history of morbid obesity, positive rheumatoid factor, chronic systolic heart failure with ejection fraction of 30-35%, hypertension, who was admitted to [facility] on 08/10/2015 with recurrent falls, inability to ambulate, lower back and right hip pain, bilateral lower extremity edema and incontinence. On admission, his labs were notable for elevated creatinine of 4.6, significant lower extremity edema with a left lower extremity ulcer.  08/14/2015:  Patient found to be somnolent, hypercapnic and hypoxic respiratory failure and transferred to ICU  Assessment/Plan  Acute on chronic systolic heart failure, EF 30-35%, grade 1 DD, severe LVE and mild LVH - Lasix per cardiology/nephrology -  Started hydralazine and imdur, titrate up as blood pressure tolerates -  Continue carvedilol -  No ACEI/ARB, spironolactone secondary to CKD -  Wt trending up slightly -  Net -279m yesterday  Acute metabolic encephalopathy due to hypercapneic respiratory failure, resolved.  Acute hypercapneic and hypoxic respiratory failure due to untreated OSA and acute on chronic systolic heart failure, resolved. -  Stable off bipap currently -  Nightly bipap   Fall and weakness - unclear etiology, imaging studies MRI x-rays done on admission were negative for acute finding.  - PT OT recommending SNF  Left lower extremity full-thickness ulcer 1.4 x 4 x 0.4 centimeters -wound care has seen him and recommended calcium alginate daily dressings and then subsequent 3 times a week  Migratory arthritis, RF mildly elevated at 19 (upper limit of 13), ESR > 140 -  Continue to gradually taper steroids -  Close rheumatology follow up -  ANCA, ANA, anti-GBM negative  Chronic kidney disease stage5, creatinine trending down gradually -  RUS  with mild cortical thinning of both kidneys -  Holding diuretics -  Will need eval for permanent HD access when stable  Body mass index is 56.75 kg/(m^2). - Life-threatening  Uncontrolled hypertension - Cannot use ACE inhibitor - Hold parameter for low BP for Coreg  Anemia probably of renal disease vs blood loss anemia - Iron deficiency vs. Chronic disease - hemoccult stool - Given feraheme and arenesp on 11/30 - B12 and folate wnl  Diet: renal Access: PIV IVF: off Proph: heparin  Code Status: full Family Communication: patient alone  Disposition Plan:  Ongoing monitoring of kidney function and diuresis.  PT/OT SNF   Consultants:  PCCM  Nephrology  Cardiology  STUDIES:  11/29 ECHO >> severe LV dilation, mild LVH, moderate to severe reduction of systolic dysfunction with an LVEF of 365-46% grade 1 diastolic dysfunction 150/35Renal [place]>> mild cortical thinning in both kidneys, no hydronephrosis.  12/1 CT Chest>> mild bibasilar atelectasis, no evidence of pneumonitis   CULTURES: 11/30 MRSA PCR >> positive  11/30 BCx2 >>   ANTIBIOTICS: Zosyn 11/30 >> 12/2  SIGNIFICANT EVENTS: 11/26 Admit with weakness, swelling, falls. Found to have AKI, anemia, volume overload.  LINES/TUBES: Left IJ HD cath 12/1>>>  HPI/Subjective:  States that he is feeling much better. Breathing is more comfortable. He does not remember me from previous days. Swelling has improved somewhat. Denies abdominal pain.    Objective: Filed Vitals:   08/18/15 1000 08/18/15 1100 08/18/15 1200 08/18/15 1300  BP:   114/73   Pulse: 82 80 80 89  Temp:   97.1 F (36.2 C)   TempSrc:   Oral   Resp: _0 Height:  Weight:      SpO2: 100% 99% 100% 97%    Intake/Output Summary (Last 24 hours) at 08/18/15 1501 Last data filed at 08/18/15 1200  Gross per 24 hour  Intake   1850 ml  Output   2375 ml  Net   -525 ml   Filed Weights   08/13/15 0300 08/16/15 0600  08/17/15 0500  Weight: 179.171 kg (395 lb) 173.728 kg (383 lb) 174.181 kg (384 lb)   Body mass index is 53.58 kg/(m^2).  Exam:   General:  Adult male, obese, No acute distress  HEENT:  NCAT, MMM, central line located in the left neck  Cardiovascular:  RRR, nl S1, S2 no mrg, 2+ pulses, warm extremities  Respiratory:  Distant breath sounds, no increased WOB  Abdomen:   NABS, soft, NT/ND  MSK:   Normal tone and bulk, woody bilateral LEE, 2 cm ulcer on the anterior left lower shin  Data Reviewed: Basic Metabolic Panel:  Recent Labs Lab 08/14/15 0440 08/15/15 0302 08/15/15 1829 08/16/15 0356 08/17/15 0232 08/18/15 0335  NA 138 141 142 144 146* 144  K 4.4 5.5* 5.1 4.4 4.6 4.7  CL 101 102 100* 96* 101 102  CO2 _0 34* 33*  GLUCOSE 126* 125* 172* 162* 143* 138*  BUN 76* 92* 106* 105* 114* 109*  CREATININE 4.52* 4.83* 5.22* 5.09* 4.89* 4.28*  CALCIUM 9.2 8.8* 8.5* 7.9* 8.6* 8.6*  MG  --  2.2  --  2.0 2.2 2.1  PHOS 4.4 8.6*  --  6.3* 5.1* 3.7   Liver Function Tests:  Recent Labs Lab 08/14/15 0440 08/15/15 0302 08/16/15 0356 08/17/15 0232 08/18/15 0335  AST 27  --   --   --   --   ALT 25  --   --   --   --   ALKPHOS 70  --   --   --   --   BILITOT 1.0  --   --   --   --   PROT 7.7  --   --   --   --   ALBUMIN 2.3* 2.3* 1.9* 2.1* 2.1*   No results for input(s): LIPASE, AMYLASE in the last 168 hours.  Recent Labs Lab 08/14/15 0440  AMMONIA 37*   CBC:  Recent Labs Lab 08/14/15 0440 08/15/15 0302 08/15/15 2308 08/16/15 0356 08/17/15 0232 08/18/15 0335  WBC 20.7* 20.5* 15.2* 14.3* 15.7* 14.2*  NEUTROABS 16.6*  --  13.1*  --   --   --   HGB 7.8* 8.3* 8.1* 7.1* 8.0* 7.7*  HCT 23.8* 26.5* 25.7* 23.4* 26.1* 25.8*  MCV 79.3 82.3 82.1 81.8 82.1 83.0  PLT 292 293 330 340 367 335    Recent Results (from the past 240 hour(s))  Culture, blood (x 2)     Status: None (Preliminary result)   Collection Time: 08/14/15 11:01 AM  Result Value Ref Range  Status   Specimen Description BLOOD LEFT HAND  Final   Special Requests BOTTLES DRAWN AEROBIC AND ANAEROBIC [place]  Final   Culture NO GROWTH 4 DAYS  Final   Report Status PENDING  Incomplete  Culture, blood (x 2)     Status: None (Preliminary result)   Collection Time: 08/14/15 11:04 AM  Result Value Ref Range Status   Specimen Description BLOOD RIGHT HAND  Final   Special Requests BOTTLES DRAWN AEROBIC AND ANAEROBIC 10CC  Final   Culture NO GROWTH 4 DAYS  Final   Report Status PENDING  Incomplete  MRSA PCR Screening     Status: Abnormal   Collection Time: 08/14/15 12:16 PM  Result Value Ref Range Status   MRSA by PCR POSITIVE (A) NEGATIVE Final    Comment:        The GeneXpert MRSA Assay (FDA approved for NASAL specimens only), is one component of a comprehensive MRSA colonization surveillance program. It is not intended to diagnose MRSA infection nor to guide or monitor treatment for MRSA infections. RESULT CALLED TO, READ BACK BY AND VERIFIED WITH: AEliot Ford RN 13:35 08/14/15 ([email or web address])      Studies: Dg Chest Port 1 View  08/17/2015  CLINICAL DATA:  Pulmonary edema. History of congestive heart failure and hypertension. EXAM: PORTABLE CHEST 1 VIEW COMPARISON:  Radiographs 08/16/2015.  CT 08/15/2015. FINDINGS: 0530 hours. Left IJ central venous catheter is unchanged at the level of the confluence of the brachiocephalic veins. There is stable cardiomegaly and vascular congestion. Basilar aeration has minimally worsened. There is no confluent airspace opacity or significant pleural effusion. The bones appear unchanged. IMPRESSION: Minimal worsening of bibasilar aeration.  Stable cardiomegaly. Electronically Signed   By: Richardean Sale M.D.   On: 08/17/2015 09:12    Scheduled Meds: . antiseptic oral rinse  7 mL Mouth Rinse q12n4p  . carvedilol  6.25 mg Oral BID WC  . chlorhexidine  15 mL Mouth Rinse BID  . Chlorhexidine Gluconate Cloth  6 each Topical Q0600  . heparin  5,000  Units Subcutaneous 3 times per day  . hydrALAZINE  25 mg Oral 3 times per day  . hydrocerin   Topical Daily  . insulin aspart  0-9 Units Subcutaneous 6 times per day  . isosorbide mononitrate  30 mg Oral Daily  . methylPREDNISolone (SOLU-MEDROL) injection  40 mg Intravenous Q12H  . mupirocin ointment  1 application Nasal BID  . sodium chloride  3 mL Intravenous Q12H   Continuous Infusions: . sodium chloride 10 mL/hr at 08/16/15 7517    Principal Problem:   Acute respiratory failure with hypoxia and hypercapnia (HCC) Active Problems:   AKI (acute kidney injury) ([place])   Acute on chronic combined systolic and diastolic heart failure (HCC)   Hypertension   Arthritis   Stasis ulcer ([place])   Metabolic encephalopathy   Morbid obesity with alveolar hypoventilation (HCC)   Debilitated patient   NICM (nonischemic cardiomyopathy) ([place])   Acute respiratory failure ([place])   Encounter for central line placement   Fall at home    Time spent: 30 min    Lilliam Ch[place], [place] [facility] Pager [phone]. If 7PM-7AM, please contact night-coverage at [email or web address], password [facility] 08/18/2015, 3:01 PM  LOS: 7 days

## 2015-08-18 NOTE — Progress Notes (Signed)
Assessment:  1  Chronic CKD, suspected stage 5 , azotemia on steroids (never dialyzed) 2 Volume overload/EF 30-35%-improving 3 Anemia, iron deficient and ESA deficient-Rx'd 4 LE wounds 5 Morbid obesity 6 Pos Rheumatoid factor 7  Hypertension 8  Symptomatic-Hypercarbic resp insuff, off BiPAP 9   Resp Acidosis 10 Hyperkalemia, resolved   Renal Plan: Observe, diuresing without diuretics When stable will need eval for permanent access  Subjective: Interval History: More alert today  Objective: Vital signs in last 24 hours: Temp:  [97.6 F (36.4 C)-98.8 F (37.1 C)] 98.8 F (37.1 C) (12/04 0733) Pulse Rate:  [77-94] 80 (12/04 0900) Resp:  [16-23] 22 (12/04 0900) BP: (116-151)/(64-90) 134/73 mmHg (12/04 0733) SpO2:  [88 %-100 %] 98 % (12/04 0900) FiO2 (%):  [40 %] 40 % (12/04 0352) Weight change:   Intake/Output from previous day: 12/03 0701 - 12/04 0700 In: 2593 [P.O.:2560; I.V.:33] Out: 2885 [Urine:2885] Intake/Output this shift: Total I/O In: 240 [P.O.:240] Out: 200 [Urine:200]  General appearance: alert and cooperative GI: soft, non-tender; bowel sounds normal; no masses,  no organomegaly and protuberant Extremities: edema less with chronic changes Neurologic: Grossly normal  No asterixis today  Lab Results:  Recent Labs  08/17/15 0232 08/18/15 0335  WBC 15.7* 14.2*  HGB 8.0* 7.7*  HCT 26.1* 25.8*  PLT 367 335   BMET:  Recent Labs  08/17/15 0232 08/18/15 0335  NA 146* 144  K 4.6 4.7  CL 101 102  CO2 34* 33*  GLUCOSE 143* 138*  BUN 114* 109*  CREATININE 4.89* 4.28*  CALCIUM 8.6* 8.6*   No results for input(s): PTH in the last 72 hours. Iron Studies: No results for input(s): IRON, TIBC, TRANSFERRIN, FERRITIN in the last 72 hours. Studies/Results: Dg Chest Port 1 View  08/17/2015  CLINICAL DATA:  Pulmonary edema. History of congestive heart failure and hypertension. EXAM: PORTABLE CHEST 1 VIEW COMPARISON:  Radiographs 08/16/2015.  CT  08/15/2015. FINDINGS: 0530 hours. Left IJ central venous catheter is unchanged at the level of the confluence of the brachiocephalic veins. There is stable cardiomegaly and vascular congestion. Basilar aeration has minimally worsened. There is no confluent airspace opacity or significant pleural effusion. The bones appear unchanged. IMPRESSION: Minimal worsening of bibasilar aeration.  Stable cardiomegaly. Electronically Signed   By: Richardean Sale M.D.   On: 08/17/2015 09:12    Scheduled: . antiseptic oral rinse  7 mL Mouth Rinse q12n4p  . carvedilol  6.25 mg Oral BID WC  . chlorhexidine  15 mL Mouth Rinse BID  . Chlorhexidine Gluconate Cloth  6 each Topical Q0600  . heparin  5,000 Units Subcutaneous 3 times per day  . hydrALAZINE  25 mg Oral 3 times per day  . hydrocerin   Topical Daily  . insulin aspart  0-9 Units Subcutaneous 6 times per day  . isosorbide mononitrate  30 mg Oral Daily  . methylPREDNISolone (SOLU-MEDROL) injection  40 mg Intravenous Q12H  . mupirocin ointment  1 application Nasal BID  . sodium chloride  3 mL Intravenous Q12H    LOS: 7 days   Noah Garner C 08/18/2015,9:26 AM

## 2015-08-19 DIAGNOSIS — M05762 Rheumatoid arthritis with rheumatoid factor of left knee without organ or systems involvement: Secondary | ICD-10-CM

## 2015-08-19 DIAGNOSIS — M05761 Rheumatoid arthritis with rheumatoid factor of right knee without organ or systems involvement: Secondary | ICD-10-CM

## 2015-08-19 LAB — RENAL FUNCTION PANEL
Albumin: 2.3 g/dL — ABNORMAL LOW (ref 3.5–5.0)
Anion gap: 11 (ref 5–15)
BUN: 101 mg/dL — AB (ref 6–20)
CALCIUM: 8.9 mg/dL (ref 8.9–10.3)
CO2: 32 mmol/L (ref 22–32)
CREATININE: 3.61 mg/dL — AB (ref 0.61–1.24)
Chloride: 99 mmol/L — ABNORMAL LOW (ref 101–111)
GFR calc non Af Amer: 18 mL/min — ABNORMAL LOW (ref >60)
GFR, EST AFRICAN AMERICAN: 20 mL/min — AB (ref >60)
GLUCOSE: 122 mg/dL — AB (ref 65–99)
Phosphorus: 3.3 mg/dL (ref 2.5–4.6)
Potassium: 4.9 mmol/L (ref 3.5–5.1)
SODIUM: 142 mmol/L (ref 135–145)

## 2015-08-19 LAB — CULTURE, BLOOD (ROUTINE X 2)
Culture: NO GROWTH
Culture: NO GROWTH

## 2015-08-19 LAB — GLUCOSE, CAPILLARY
GLUCOSE-CAPILLARY: 103 mg/dL — AB (ref 65–99)
GLUCOSE-CAPILLARY: 119 mg/dL — AB (ref 65–99)
GLUCOSE-CAPILLARY: 134 mg/dL — AB (ref 65–99)
GLUCOSE-CAPILLARY: 141 mg/dL — AB (ref 65–99)
Glucose-Capillary: 145 mg/dL — ABNORMAL HIGH (ref 65–99)
Glucose-Capillary: 166 mg/dL — ABNORMAL HIGH (ref 65–99)
Glucose-Capillary: 199 mg/dL — ABNORMAL HIGH (ref 65–99)

## 2015-08-19 LAB — CBC
HEMATOCRIT: 25.9 % — AB (ref 39.0–52.0)
HEMOGLOBIN: 8 g/dL — AB (ref 13.0–17.0)
MCH: 25.6 pg — AB (ref 26.0–34.0)
MCHC: 30.9 g/dL (ref 30.0–36.0)
MCV: 83 fL (ref 78.0–100.0)
Platelets: 304 10*3/uL (ref 150–400)
RBC: 3.12 MIL/uL — ABNORMAL LOW (ref 4.22–5.81)
RDW: 17.2 % — ABNORMAL HIGH (ref 11.5–15.5)
WBC: 15.2 10*3/uL — ABNORMAL HIGH (ref 4.0–10.5)

## 2015-08-19 LAB — MAGNESIUM: Magnesium: 2.1 mg/dL (ref 1.7–2.4)

## 2015-08-19 MED ORDER — DARBEPOETIN ALFA 200 MCG/0.4ML IJ SOSY: 200.0000 ug | PREFILLED_SYRINGE | INTRAMUSCULAR | Status: DC

## 2015-08-19 MED ORDER — PREDNISONE 20 MG PO TABS
60.0000 mg | ORAL_TABLET | Freq: Every day | ORAL | Status: DC
  Administered 2015-08-20: 60 mg via ORAL
  Filled 2015-08-19: qty 3

## 2015-08-19 NOTE — Progress Notes (Signed)
[facility] PROGRESS NOTE  Pinchas Reither [id number] DOB: [birth year] DOA: 08/10/2015 PCP: Nilda Simmer, MD  Brief Summary  The patient is a 54 year old male with history of morbid obesity, positive rheumatoid factor, chronic systolic heart failure with ejection fraction of 30-35%, hypertension, who was admitted to [facility] on 08/10/2015 with recurrent falls, inability to ambulate, lower back and right hip pain, bilateral lower extremity edema and incontinence. On admission, his labs were notable for elevated creatinine of 4.6, significant lower extremity edema with a left lower extremity ulcer.  08/14/2015:  Patient found to be somnolent, hypercapnic and hypoxic respiratory failure and transferred to ICU  Assessment/Plan  Acute on chronic systolic heart failure, EF 30-35%, grade 1 DD, severe LVE and mild LVH - continue to hold lasix per nephrology.  May resume in a couple of days -  Continue hydralazine and imdur -  Continue carvedilol -  No ACEI/ARB, spironolactone secondary to AoCKD -  Net -718m yesterday  Acute metabolic encephalopathy due to hypercapneic respiratory failure, resolved.  Acute hypercapneic and hypoxic respiratory failure due to untreated OSA and acute on chronic systolic heart failure, resolved. -  Stable off bipap currently -  Nightly bipap   Fall and weakness - unclear etiology, imaging studies MRI x-rays done on admission were negative for acute finding.  - PT OT recommending SNF  Left lower extremity full-thickness ulcer 1.4 x 4 x 0.4 centimeters -wound care has seen him and recommended calcium alginate daily dressings and then subsequent 3 times a week  Migratory arthritis, RF mildly elevated at 19 (upper limit of 13), ESR > 140 -  D/c solumedrol and start prednisone 644mdaily -  Close rheumatology follow up -  ANCA, ANA, anti-GBM negative  Chronic kidney disease stage5, creatinine trending down gradually -  RUS with mild  cortical thinning of both kidneys -  Holding diuretics -  Will need eval for permanent HD access when stable  Body mass index is 56.75 kg/(m^2). - Life-threatening  essential hypertension - Cannot use ACE inhibitor - Hold parameter for low BP for Coreg  Anemia probably of renal disease vs blood loss anemia - Iron deficiency vs. Chronic disease - hemoccult stool - Given feraheme and arenesp on 11/30 - B12 and folate wnl  Diet: renal Access: PIV IVF: off Proph: heparin  Code Status: full Family Communication: patient alone  Disposition Plan:  To SNF  Consultants:  PCCM  Nephrology  Cardiology  STUDIES:  11/29 ECHO >> severe LV dilation, mild LVH, moderate to severe reduction of systolic dysfunction with an LVEF of 3053-00%grade 1 diastolic dysfunction 1151/10enal [place]> mild cortical thinning in both kidneys, no hydronephrosis.  12/1 CT Chest>> mild bibasilar atelectasis, no evidence of pneumonitis   CULTURES: 11/30 MRSA PCR >> positive  11/30 BCx2 >>   ANTIBIOTICS: Zosyn 11/30 >> 12/2  SIGNIFICANT EVENTS: 11/26 Admit with weakness, swelling, falls. Found to have AKI, anemia, volume overload.  LINES/TUBES: Left IJ HD cath 12/1>>> 12/5  HPI/Subjective:  Stood up for first time today in a while.  Very weak, but felt good.  Swelling and breathing are approximately stable from yesterday.    Objective: Filed Vitals:   08/18/15 2040 08/18/15 2335 08/19/15 0419 08/19/15 0748  BP: 140/78  164/89 135/76  Pulse: 51   78  Temp:   98.4 F (36.9 C)   TempSrc:   Oral   Resp: 20     Height:      Weight:  SpO2: 94% 98%      Intake/Output Summary (Last 24 hours) at 08/19/15 1258 Last data filed at 08/19/15 0430  Gross per 24 hour  Intake    240 ml  Output   1200 ml  Net   -960 ml   Filed Weights   08/13/15 0300 08/16/15 0600 08/17/15 0500  Weight: 179.171 kg (395 lb) 173.728 kg (383 lb) 174.181 kg (384 lb)   Body mass index is  53.58 kg/(m^2).  Exam:   General:  Adult male, obese, No acute distress  HEENT:  NCAT, MMM, central line located in the left neck  Cardiovascular:  RRR, nl S1, S2 no mrg, 2+ pulses, warm extremities  Respiratory:  Distant breath sounds, no increased WOB  Abdomen:   NABS, soft, NT/ND  MSK:   Normal tone and bulk, woody bilateral LEE, 2 cm ulcer on the anterior left lower shin  Data Reviewed: Basic Metabolic Panel:  Recent Labs Lab 08/15/15 0302 08/15/15 1829 08/16/15 0356 08/17/15 0232 08/18/15 0335 08/19/15 0425  NA 141 142 144 146* 144 142  K 5.5* 5.1 4.4 4.6 4.7 4.9  CL 102 100* 96* 101 102 99*  CO2 '24 31 29 ' 34* 33* 32  GLUCOSE 125* 172* 162* 143* 138* 122*  BUN 92* 106* 105* 114* 109* 101*  CREATININE 4.83* 5.22* 5.09* 4.89* 4.28* 3.61*  CALCIUM 8.8* 8.5* 7.9* 8.6* 8.6* 8.9  MG 2.2  --  2.0 2.2 2.1 2.1  PHOS 8.6*  --  6.3* 5.1* 3.7 3.3   Liver Function Tests:  Recent Labs Lab 08/14/15 0440 08/15/15 0302 08/16/15 0356 08/17/15 0232 08/18/15 0335 08/19/15 0425  AST 27  --   --   --   --   --   ALT 25  --   --   --   --   --   ALKPHOS 70  --   --   --   --   --   BILITOT 1.0  --   --   --   --   --   PROT 7.7  --   --   --   --   --   ALBUMIN 2.3* 2.3* 1.9* 2.1* 2.1* 2.3*   No results for input(s): LIPASE, AMYLASE in the last 168 hours.  Recent Labs Lab 08/14/15 0440  AMMONIA 37*   CBC:  Recent Labs Lab 08/14/15 0440  08/15/15 2308 08/16/15 0356 08/17/15 0232 08/18/15 0335 08/19/15 0425  WBC 20.7*  < > 15.2* 14.3* 15.7* 14.2* 15.2*  NEUTROABS 16.6*  --  13.1*  --   --   --   --   HGB 7.8*  < > 8.1* 7.1* 8.0* 7.7* 8.0*  HCT 23.8*  < > 25.7* 23.4* 26.1* 25.8* 25.9*  MCV 79.3  < > 82.1 81.8 82.1 83.0 83.0  PLT 292  < > 330 340 367 335 304  < > = values in this interval not displayed.  Recent Results (from the past 240 hour(s))  Culture, blood (x 2)     Status: None (Preliminary result)   Collection Time: 08/14/15 11:01 AM  Result Value  Ref Range Status   Specimen Description BLOOD LEFT HAND  Final   Special Requests BOTTLES DRAWN AEROBIC AND ANAEROBIC [place]  Final   Culture NO GROWTH 4 DAYS  Final   Report Status PENDING  Incomplete  Culture, blood (x 2)     Status: None (Preliminary result)   Collection Time: 08/14/15 11:04 AM  Result Value  Ref Range Status   Specimen Description BLOOD RIGHT HAND  Final   Special Requests BOTTLES DRAWN AEROBIC AND ANAEROBIC 10CC  Final   Culture NO GROWTH 4 DAYS  Final   Report Status PENDING  Incomplete  MRSA PCR Screening     Status: Abnormal   Collection Time: 08/14/15 12:16 PM  Result Value Ref Range Status   MRSA by PCR POSITIVE (A) NEGATIVE Final    Comment:        The GeneXpert MRSA Assay (FDA approved for NASAL specimens only), is one component of a comprehensive MRSA colonization surveillance program. It is not intended to diagnose MRSA infection nor to guide or monitor treatment for MRSA infections. RESULT CALLED TO, READ BACK BY AND VERIFIED WITH: A. BARLOW RN 13:35 08/14/15 ([email or web address])      Studies: No results found.  Scheduled Meds: . antiseptic oral rinse  7 mL Mouth Rinse q12n4p  . carvedilol  6.25 mg Oral BID WC  . chlorhexidine  15 mL Mouth Rinse BID  . Chlorhexidine Gluconate Cloth  6 each Topical Q0600  . [START ON 08/21/2015] darbepoetin (ARANESP) injection - NON-DIALYSIS  200 mcg Subcutaneous Q Wed-1800  . heparin  5,000 Units Subcutaneous 3 times per day  . hydrALAZINE  25 mg Oral 3 times per day  . hydrocerin   Topical Daily  . insulin aspart  0-9 Units Subcutaneous 6 times per day  . isosorbide mononitrate  30 mg Oral Daily  . methylPREDNISolone (SOLU-MEDROL) injection  40 mg Intravenous Q12H  . sodium chloride  3 mL Intravenous Q12H   Continuous Infusions: . sodium chloride 10 mL/hr at 08/16/15 2820    Principal Problem:   Acute respiratory failure with hypoxia and hypercapnia (HCC) Active Problems:   AKI (acute kidney injury) ([place])    Acute on chronic combined systolic and diastolic heart failure (HCC)   Hypertension   Arthritis   Stasis ulcer ([place])   Metabolic encephalopathy   Morbid obesity with alveolar hypoventilation (HCC)   Debilitated patient   NICM (nonischemic cardiomyopathy) ([place])   Acute respiratory failure ([place])   Encounter for central line placement   Fall at home   Rheumatoid arthritis ([place])    Time spent: 30 min    Maxie Slovacek, [facility] Pager [phone]. If 7PM-7AM, please contact night-coverage at [email or web address], password [facility] 08/19/2015, 12:58 PM  LOS: 8 days

## 2015-08-19 NOTE — Progress Notes (Signed)
[facility] Rounding Note  Subjective: No new issues  Objective: Vital signs in last 24 hours: Temp:  [97 F (36.1 C)-98.9 F (37.2 C)] 98.4 F (36.9 C) (12/05 0419) Pulse Rate:  [51-89] 78 (12/05 0748) Resp:  [14-22] 20 (12/04 2040) BP: (112-164)/(65-89) 135/76 mmHg (12/05 0748) SpO2:  [94 %-100 %] 98 % (12/04 2335) FiO2 (%):  [28 %] 28 % (12/04 2335) Weight change:   Intake/Output from previous day: 12/04 0701 - 12/05 0700 In: 960 [P.O.:960] Out: 1675 [Urine:1675] Intake/Output this shift:   Physical Examination BP 135/76 mmHg  Pulse 78  Temp(Src) 98.4 F (36.9 C) (Oral)  Resp 20  Ht 5\' 11"  (1.803 m)  Wt 174.181 kg (384 lb)  BMI 53.58 kg/m2  SpO2 98% Pleasant, morbidly obese AAM NAD Wearing nasal cannula O2 Grossly clear Chronic changes LE's with woody edema  Lab Results:  Recent Labs  08/18/15 0335 08/19/15 0425  WBC 14.2* 15.2*  HGB 7.7* 8.0*  HCT 25.8* 25.9*  PLT 335 304   BMET:   Recent Labs  08/18/15 0335 08/19/15 0425  NA 144 142  K 4.7 4.9  CL 102 99*  CO2 33* 32  GLUCOSE 138* 122*  BUN 109* 101*  CREATININE 4.28* 3.61*  CALCIUM 8.6* 8.9     Scheduled: . antiseptic oral rinse  7 mL Mouth Rinse q12n4p  . carvedilol  6.25 mg Oral BID WC  . chlorhexidine  15 mL Mouth Rinse BID  . Chlorhexidine Gluconate Cloth  6 each Topical Q0600  . heparin  5,000 Units Subcutaneous 3 times per day  . hydrALAZINE  25 mg Oral 3 times per day  . hydrocerin   Topical Daily  . insulin aspart  0-9 Units Subcutaneous 6 times per day  . isosorbide mononitrate  30 mg Oral Daily  . methylPREDNISolone (SOLU-MEDROL) injection  40 mg Intravenous Q12H  . sodium chloride  3 mL Intravenous Q12H   Assessment/Recommendations:  1. Chronic CKD, suspected stage 5 on admission (but GFR has improved to 20 since admission ->Stage 4) , azotemia on steroids (never dialyzed). Pt is diuresing (1.6 liters out yesterday. Last lasix was 12/3.  Creatinine is  falling steadily since 12/3   - down to 3.6 today  (5.2 on 12/1. Baseline is not known). Would continue to monitor renal function and volume. Will need a permanent access when stable but at rate of current drop in creatinine would not necessarily have to be done this admission.  Will need to restart his home lasix at some point - would give another day or so to see where renal function levels off.  Dr. Florene Glen has indicated he would be willing to see the patient in the office for nephrology followup.  Given improving function, will sign off at this time.    2. Volume overload/EF 30-35%-improving 3. Anemia, iron deficient and ESA deficient-Rx'd with Darbe 200 on 11/30 (should repeat on 12/7) and feraheme 510 on 11/30. 4. LE wounds 5. Morbid obesity 6. Pos Rheumatoid factor 7. Hypertension 8. Symptomatic-Hypercarbic resp insuff, off BiPAP 9. Resp Acidosis 10. Hyperkalemia, resolved  Noah Maes, MD [facility] [phone] Pager 08/19/2015, 11:36 AM

## 2015-08-19 NOTE — Progress Notes (Signed)
Physical Therapy Treatment Patient Details Name: Noah Garner MRN: [id number] DOB: 1961/01/15 Today's Date: 08/19/2015    History of Present Illness Noah Garner is a 54 y.o. male with past medical history of morbid obesity, HTN, CHF, and rheumatoid arthritis (recently diagnosed, not on treatment) who presented to [name] on 08/10/2015 for worsening lower back pain, joint pain, and increased weakness. He reports decreased functional capacity over the past month and had two falls on his day of admission, which prompted him to seek medical attention.    PT Comments    Pt made good progress today and was able to stand from bed to RW with MAX of 2.  He was unable to take steps.  Requested barichair for room, so pt can start getting up with [place]. Pt cooperative throughout session and appears motivated. Was frustrated at how weak he was with inability to scoot at EOB without assistance. Con't to recommend SNF.  Follow Up Recommendations  SNF     Equipment Recommendations  None recommended by PT    Recommendations for Other Services       Precautions / Restrictions Precautions Precautions: Fall Restrictions Weight Bearing Restrictions: No    Mobility  Bed Mobility Overal bed mobility: Needs Assistance Bed Mobility: Supine to Sit;Sit to Supine     Supine to sit: Min assist;HOB elevated;+2 for safety/equipment Sit to supine: Mod assist;+2 for physical assistance   General bed mobility comments: Pt needed PT's hand for leverage to pull up.  On return to supine, needed A with LE and A with hips on bed pad  Transfers Overall transfer level: Needs assistance Equipment used: Rolling walker (2 wheeled) Transfers: Sit to/from Stand Sit to Stand: +2 physical assistance;Max assist;From elevated surface         General transfer comment: While sitting on side of bed, attempted scooting, but every time pt's head went forward he c/o dizziness.  WIht standing, 1st attempt, unable to  achieve standing. On 2nd, pt able to stand with use of bed pad under buttocks to help get fully upright.  Pt c/o R foot numbness/pain.  Unable to take any steps.  Ambulation/Gait             General Gait Details: Unable to take any steps   Stairs            Wheelchair Mobility    Modified Rankin (Stroke Patients Only)       Balance   Sitting-balance support: Feet supported;No upper extremity supported Sitting balance-Leahy Scale: Good     Standing balance support: Bilateral upper extremity supported Standing balance-Leahy Scale: Poor Standing balance comment: Stood at [employer] with no dizziness, but needed RW and plus 2 for safety                    Cognition Arousal/Alertness: Awake/alert Behavior During Therapy: WFL for tasks assessed/performed Overall Cognitive Status: Within Functional Limits for tasks assessed                      Exercises General Exercises - Lower Extremity Ankle Circles/Pumps: AROM;Both;20 reps;Seated Long Arc Quad: Strengthening;Both;10 reps;Seated Hip Flexion/Marching: Both;10 reps;Seated    General Comments        Pertinent Vitals/Pain Pain Assessment: Faces Faces Pain Scale: Hurts even more Pain Location: R foot with standing Pain Descriptors / Indicators: Numbness Pain Intervention(s): Monitored during session;Limited activity within patient's tolerance    Home Living  Prior Function            PT Goals (current goals can now be found in the care plan section) Acute Rehab PT Goals Patient Stated Goal: get better. PT Goal Formulation: With patient Time For Goal Achievement: 08/27/15 Potential to Achieve Goals: Fair Progress towards PT goals: Progressing toward goals    Frequency  Min 3X/week    PT Plan Current plan remains appropriate    Co-evaluation             End of Session Equipment Utilized During Treatment: Gait belt;Oxygen Activity Tolerance: Patient  limited by fatigue Patient left: in bed;with call bell/phone within reach     Time: [id number] PT Time Calculation (min) (ACUTE ONLY): 23 min  Charges:  $Therapeutic Exercise: 8-22 mins $Therapeutic Activity: 8-22 mins                    G Codes:      Rissa Turley LUBECK 08/19/2015, 9:59 AM

## 2015-08-19 NOTE — Care Management Note (Addendum)
Case Management Note  Patient Details  Name: Noah Garner MRN: [id number] Date of Birth: 05-25-1961  Subjective/Objective:    Pt admitted for Chronic CHF- Ef 30-35 %. Pt with recurrent falls and BLE edema on admission. Respiratory Failure upon admission- Bipap Initiated.  Pt is from home.   Action/Plan: CM did discuss disposition plans with MD Short. Plan will be for SNF once stable. CSW assisting with disposition needs. No further needs from CM at this time.    Expected Discharge Date:                  Expected Discharge Plan:  Skilled Nursing Facility  In-House Referral:  Clinical Social Work  Discharge planning Services  CM Consult  Post Acute Care Choice:  NA Choice offered to:  NA  DME Arranged:  N/A DME Agency:  NA  HH Arranged:  NA HH Agency:  NA  Status of Service:  Completed, signed off  Medicare Important Message Given:  Yes Date Medicare IM Given:    Medicare IM give by:    Date Additional Medicare IM Given:    Additional Medicare Important Message give by:     If discussed at [place] of Stay Meetings, dates discussed:    Additional Comments:  Bethena Roys, RN 08/19/2015, 2:51 PM

## 2015-08-19 NOTE — Progress Notes (Signed)
Occupational Therapy Treatment Patient Details Name: Noah Garner MRN: [id number] DOB: Oct 02, 1960 Today's Date: 08/19/2015    History of present illness Noah Garner is a 54 y.o. male with past medical history of morbid obesity, HTN, CHF, and rheumatoid arthritis (recently diagnosed, not on treatment) who presented to [name] on 08/10/2015 for worsening lower back pain, joint pain, and increased weakness. He reports decreased functional capacity over the past month and had two falls on his day of admission, which prompted him to seek medical attention.   OT comments  Pt doing well able to tolerate standing for 3 intervals of 30 seconds to 45 mins for toileting tasks.  Still fatigues easily but O2 sats 93 % or greater on 3.5 Ls nasal cannula.  Continues to need +2 assistance for LB selfcare tasks sit to stand.  Did not attempt transfer to bedside chair.  Will hopefully have bariatric one soon for OOB tolerance.  Based on age and motivation to get better, feel that CIR consult is warranted.   Follow Up Recommendations  CIR    Equipment Recommendations  Other (comment) (TBD next venue of care)    Recommendations for Other Services      Precautions / Restrictions Precautions Precautions: Fall Restrictions Weight Bearing Restrictions: No       Mobility Bed Mobility Overal bed mobility: Needs Assistance Bed Mobility: Supine to Sit;Sit to Supine     Supine to sit: Mod assist;HOB elevated Sit to supine: Min assist   General bed mobility comments:   Needed mod assist for scooting to the EOB once sitting when getting to the edge of the bed.   Pt able to use rail and manage LEs when transferring back into the bed.  Transfers Overall transfer level: Needs assistance Equipment used: 1 person hand held assist Transfers: Sit to/from Stand Sit to Stand: Mod assist;From elevated surface;+2 physical assistance              Balance     Sitting balance-Leahy Scale: Good       Standing balance-Leahy Scale: Poor                     ADL Overall ADL's : Needs assistance/impaired                             Toileting- Clothing Manipulation and Hygiene: +2 for physical assistance;Moderate assistance;Sit to/from stand         General ADL Comments: Pt completed 3 sit to stand intervals during session, with one being to complete toileting tasks and change pads on his bed.  Mod assist +2 for sit to stand and standing with pt maintiaining standing for 30 to 45 seconds each interval.  Decreased hip and trunk extension in standing with pt reporting that he cannot feel his right foot secondary to numbness.       Vision                     Perception     Praxis      Cognition   Behavior During Therapy: [facility] for tasks assessed/performed Overall Cognitive Status: Within Functional Limits for tasks assessed                       Extremity/Trunk Assessment               Exercises     Shoulder Instructions  General Comments      Pertinent Vitals/ Pain       Pain Assessment: No/denies pain  Home Living                                          Prior Functioning/Environment              Frequency Min 2X/week     Progress Toward Goals  OT Goals(current goals can now be found in the care plan section)  Progress towards OT goals: Progressing toward goals     Plan      Co-evaluation                 End of Session     Activity Tolerance Patient limited by fatigue   Patient Left in bed;with call bell/phone within reach   Nurse Communication Mobility status        Time: [id number] OT Time Calculation (min): 38 min  Charges: OT General Charges $OT Visit: 1 Procedure OT Treatments $Self Care/Home Management : 38-52 mins  Noah Garner 08/19/2015, 1:46 PM

## 2015-08-19 NOTE — Progress Notes (Signed)
Patient Profile: 54 y/o morbidly obese male with h/o obesity, HTN, CHF and RA admitted for acute on chronic CHF with newly discovered low EF of 30-35% and acute on chronic, stage 5, kidney disease with SCr of 4.47 on admit.   Subjective: No complaints. Denies CP or dyspnea.   Objective: Vital signs in last 24 hours: Temp:  [97 F (36.1 C)-98.9 F (37.2 C)] 98.4 F (36.9 C) (12/05 0419) Pulse Rate:  [51-89] 78 (12/05 0748) Resp:  [14-22] 20 (12/04 2040) BP: (112-164)/(65-89) 135/76 mmHg (12/05 0748) SpO2:  [94 %-100 %] 98 % (12/04 2335) FiO2 (%):  [28 %] 28 % (12/04 2335) Last BM Date: 08/16/15  Intake/Output from previous day: 12/04 0701 - 12/05 0700 In: 960 [P.O.:960] Out: 1675 [Urine:1675] Intake/Output this shift:    Medications Current Facility-Administered Medications  Medication Dose Route Frequency Provider Last Rate Last Dose  . 0.9 %  sodium chloride infusion   Intravenous Continuous Janece Canterbury, MD 10 mL/hr at 08/16/15 [phone]    . acetaminophen (TYLENOL) tablet 650 mg  650 mg Oral Q6H PRN Theressa Millard, MD       Or  . acetaminophen (TYLENOL) suppository 650 mg  650 mg Rectal Q6H PRN Theressa Millard, MD      . alum & mag hydroxide-simeth (MAALOX/MYLANTA) 200-200-20 MG/5ML suspension 30 mL  30 mL Oral Q6H PRN Theressa Millard, MD      . antiseptic oral rinse (CPC / CETYLPYRIDINIUM CHLORIDE 0.05%) solution 7 mL  7 mL Mouth Rinse q12n4p Janece Canterbury, MD   7 mL at 08/16/15 1725  . carvedilol (COREG) tablet 6.25 mg  6.25 mg Oral BID WC Peter M Martinique, MD   6.25 mg at 08/19/15 0748  . chlorhexidine (PERIDEX) 0.12 % solution 15 mL  15 mL Mouth Rinse BID Janece Canterbury, MD   15 mL at 08/19/15 0748  . Chlorhexidine Gluconate Cloth 2 % PADS 6 each  6 each Topical Q0600 Janece Canterbury, MD   6 each at 08/19/15 0600  . heparin injection 1,000-6,000 Units  1,000-6,000 Units CRRT PRN Raylene Miyamoto, MD   2,800 Units at 08/15/15 1509  . heparin injection  5,000 Units  5,000 Units Subcutaneous 3 times per day Theressa Millard, MD   5,000 Units at 08/19/15 [id number]  . hydrALAZINE (APRESOLINE) tablet 25 mg  25 mg Oral 3 times per day Belva Crome, MD   25 mg at 08/19/15 [id number]  . hydrocerin (EUCERIN) cream   Topical Daily Nita Sells, MD   1 application at [id number]  . insulin aspart (novoLOG) injection 0-9 Units  0-9 Units Subcutaneous 6 times per day Juliet Rude, MD   2 Units at 08/19/15 0749  . isosorbide mononitrate (IMDUR) 24 hr tablet 30 mg  30 mg Oral Daily Belva Crome, MD   30 mg at 08/19/15 0748  . methylPREDNISolone sodium succinate (SOLU-MEDROL) 40 mg/mL injection 40 mg  40 mg Intravenous Q12H Raylene Miyamoto, MD   40 mg at 08/19/15 [id number]  . mupirocin ointment (BACTROBAN) 2 % 1 application  1 application Nasal BID Janece Canterbury, MD   1 application at [id number]  . ondansetron (ZOFRAN) tablet 4 mg  4 mg Oral Q6H PRN Theressa Millard, MD       Or  . ondansetron (ZOFRAN) injection 4 mg  4 mg Intravenous Q6H PRN Harvette C Jenkins, MD      . sodium chloride 0.9 % injection 3 mL  3 mL Intravenous Q12H Theressa Millard, MD   3 mL at 08/19/15 [id number]    PE: General appearance: alert, cooperative, no distress and morbidly obese Neck: obese, difficult to assess for JVD given body habitus Lungs: clear to auscultation bilaterally Heart: regular rate and rhythm Extremities: bilateral chronic LEE with chronic stasis dermatitis Pulses: 2+ and symmetric Skin: warm and dry Neurologic: Grossly normal  Lab Results:   Recent Labs  08/17/15 0232 08/18/15 0335 08/19/15 0425  WBC 15.7* 14.2* 15.2*  HGB 8.0* 7.7* 8.0*  HCT 26.1* 25.8* 25.9*  PLT 367 335 304   BMET  Recent Labs  08/17/15 0232 08/18/15 0335 08/19/15 0425  NA 146* 144 142  K 4.6 4.7 4.9  CL 101 102 99*  CO2 34* 33* 32  GLUCOSE 143* 138* 122*  BUN 114* 109* 101*  CREATININE 4.89* 4.28* 3.61*  CALCIUM 8.6* 8.6* 8.9    Studies/Results: 2D Echo  08/13/15 Study Conclusions  - Left ventricle: The cavity size was severely dilated. Wall thickness was increased in a pattern of mild LVH. Systolic function was moderately to severely reduced. The estimated ejection fraction was in the range of 30% to 35%. Doppler parameters are consistent with abnormal left ventricular relaxation (grade 1 diastolic dysfunction). - Impressions: Extremely limited; definity used but LV function still difficult to quantitate; moderate to severe global reduction in LV function; grade 1 diastolic dysfunction; severe LVE; mild LVH. Suggest cardiac MRI or MUGA to better assess LV function.  Impressions:  - Extremely limited; definity used but LV function still difficult to quantitate; moderate to severe global reduction in LV function; grade 1 diastolic dysfunction; severe LVE; mild LVH. Suggest cardiac MRI or MUGA to better assess LV function.    Assessment/Plan  Principal Problem:   Acute respiratory failure with hypoxia and hypercapnia (HCC) Active Problems:   AKI (acute kidney injury) ([place])   Acute on chronic combined systolic and diastolic heart failure (HCC)   Hypertension   Arthritis   Stasis ulcer ([place])   Metabolic encephalopathy   Morbid obesity with alveolar hypoventilation (HCC)   Debilitated patient   NICM (nonischemic cardiomyopathy) ([place])   Acute respiratory failure ([place])   Encounter for central line placement   Fall at home   Rheumatoid arthritis ([place])   1. Acute on Chronic Combined Systolic + Diastolic CHF: Etiology of cardiomyopathy unknown. Not a candidate for cath at this time given baseline renal function. Continue BB therapy with Coreg. No ACE/ARB given renal function. Continue Imdur + hydralazine for afterload reduction. Low sodium diet. Follow nephrology's recs regarding volume/ diuresis.   2. Acute on Chronic Kidney Disease w/ Severe Azotemia: renal function gradually improving. Nephrology  following.   3. Essential Hypertension: controlled on Coreg, hydralazine and Imdur. Will continue to titrate HF meds as BP allows.   LOS: 8 days    Loran Fleet M. Rosita Fire, PA-C 08/19/2015 8:28 AM

## 2015-08-19 NOTE — Progress Notes (Signed)
Inpatient Rehabilitation  Patient was screened by Roseland Braun for appropriateness for an Inpatient Acute Rehab consult.  At this time we are recommending an Inpatient Rehab consult.  Please order if you are agreeable.    Zafir Schauer, M.A., CCC/SLP Admission Coordinator  [facility]  Cell [phone]  

## 2015-08-19 NOTE — NC FL2 (Signed)
[place] LEVEL OF CARE SCREENING TOOL     IDENTIFICATION  Patient Name: Noah Garner Birthdate: Jan 17, 1961 Sex: male Admission Date (Current Location): 08/10/2015  [facility] and [place] Number: Herbalist and Address:  [facility], [place] [address] [place], [place] [address]      Provider Number: [id number]  Attending Physician Name and Address:  Janece Canterbury, MD  Relative Name and Phone Number:       Current Level of Care: SNF Recommended Level of Care: [place] Prior Approval Number:    Date Approved/Denied:   PASRR Number: [id number]  Discharge Plan: SNF    Current Diagnoses: Patient Active Problem List   Diagnosis Date Noted  . Rheumatoid arthritis ([place])   . Encounter for central line placement   . Fall at home   . Morbid obesity with alveolar hypoventilation ([place]) 08/15/2015  . Debilitated patient 08/15/2015  . NICM (nonischemic cardiomyopathy) ([place]) 08/15/2015  . Acute respiratory failure ([place])   . Acute respiratory failure with hypoxia and hypercapnia ([place]) 08/14/2015  . Metabolic encephalopathy [id number]  . AKI (acute kidney injury) ([place]) 08/11/2015  . Acute on chronic combined systolic and diastolic heart failure ([place]) 08/11/2015  . Hypertension 08/11/2015  . Arthritis 08/11/2015  . Stasis ulcer ([place]) 08/11/2015  . Eye contusion 08/11/2015  . Acute back pain   . Weakness of both legs   . Falls     Orientation ACTIVITIES/SOCIAL BLADDER RESPIRATION    Self, Time, Situation, Place  Active Continent    BEHAVIORAL SYMPTOMS/MOOD NEUROLOGICAL BOWEL NUTRITION STATUS      Continent  (renal/carb modified)  PHYSICIAN VISITS COMMUNICATION OF NEEDS Height & Weight Skin    Verbally 5\' 11"  (180.3 cm) 384 lbs.  (Left lower Leg Ulcer which he reports has been there for 3-4 weeks)          AMBULATORY STATUS RESPIRATION    Assist extensive        Personal Care Assistance Level of Assistance   Bathing, Dressing Bathing Assistance: Limited assistance   Dressing Assistance: Limited assistance      Functional Limitations [place]  PT (By licensed PT), OT (By licensed OT)     PT Frequency: 5X OT Frequency: 5X           Additional Factors Info  Code Status, Allergies, Isolation Precautions Code Status Info: Full Allergies Info: NKDA     Isolation Precautions Info: Contact for MRSA     Current Medications (08/19/2015):  This is the current hospital active medication list Current Facility-Administered Medications  Medication Dose Route Frequency Provider Last Rate Last Dose  . 0.9 %  sodium chloride infusion   Intravenous Continuous Janece Canterbury, MD 10 mL/hr at 08/16/15 [phone]    . acetaminophen (TYLENOL) tablet 650 mg  650 mg Oral Q6H PRN Theressa Millard, MD       Or  . acetaminophen (TYLENOL) suppository 650 mg  650 mg Rectal Q6H PRN Theressa Millard, MD      . alum & mag hydroxide-simeth (MAALOX/MYLANTA) 200-200-20 MG/5ML suspension 30 mL  30 mL Oral Q6H PRN Theressa Millard, MD      . antiseptic oral rinse (CPC / CETYLPYRIDINIUM CHLORIDE 0.05%) solution 7 mL  7 mL Mouth Rinse q12n4p Janece Canterbury, MD   7 mL at 08/16/15 1725  . carvedilol (COREG) tablet 6.25 mg  6.25 mg Oral BID WC Peter M Martinique, MD   6.25 mg at 08/19/15 0748  . chlorhexidine (PERIDEX) 0.12 % solution 15 mL  15 mL Mouth Rinse BID Janece Canterbury, MD   15 mL at 08/19/15 0748  . Chlorhexidine Gluconate Cloth 2 % PADS 6 each  6 each Topical Q0600 Janece Canterbury, MD   6 each at 08/19/15 0600  . heparin injection 1,000-6,000 Units  1,000-6,000 Units CRRT PRN Raylene Miyamoto, MD   2,800 Units at 08/15/15 1509  . heparin injection 5,000 Units  5,000 Units Subcutaneous 3 times per day Theressa Millard, MD   5,000 Units at 08/19/15 [id number]  . hydrALAZINE (APRESOLINE) tablet 25 mg  25 mg Oral 3 times per day Belva Crome, MD   25 mg at 08/19/15 [id number]   . hydrocerin (EUCERIN) cream   Topical Daily Nita Sells, MD   1 application at [id number]  . insulin aspart (novoLOG) injection 0-9 Units  0-9 Units Subcutaneous 6 times per day Juliet Rude, MD   2 Units at 08/19/15 0749  . isosorbide mononitrate (IMDUR) 24 hr tablet 30 mg  30 mg Oral Daily Belva Crome, MD   30 mg at 08/19/15 0748  . methylPREDNISolone sodium succinate (SOLU-MEDROL) 40 mg/mL injection 40 mg  40 mg Intravenous Q12H Raylene Miyamoto, MD   40 mg at 08/19/15 [id number]  . mupirocin ointment (BACTROBAN) 2 % 1 application  1 application Nasal BID Janece Canterbury, MD   1 application at [id number]  . ondansetron (ZOFRAN) tablet 4 mg  4 mg Oral Q6H PRN Theressa Millard, MD       Or  . ondansetron (ZOFRAN) injection 4 mg  4 mg Intravenous Q6H PRN Harvette Evonnie Dawes, MD      . sodium chloride 0.9 % injection 3 mL  3 mL Intravenous Q12H Theressa Millard, MD   3 mL at 08/19/15 [id number]     Discharge Medications: Please see discharge summary for a list of discharge medications.  Relevant Imaging Results:  Relevant Lab Results:  Recent Labs    Additional Information SSN: [id number]  [place], [place], LCSW

## 2015-08-20 DIAGNOSIS — G9341 Metabolic encephalopathy: Secondary | ICD-10-CM | POA: Diagnosis not present

## 2015-08-20 DIAGNOSIS — E0865 Diabetes mellitus due to underlying condition with hyperglycemia: Secondary | ICD-10-CM | POA: Diagnosis not present

## 2015-08-20 DIAGNOSIS — E662 Morbid (severe) obesity with alveolar hypoventilation: Secondary | ICD-10-CM | POA: Diagnosis not present

## 2015-08-20 DIAGNOSIS — J9600 Acute respiratory failure, unspecified whether with hypoxia or hypercapnia: Secondary | ICD-10-CM | POA: Diagnosis not present

## 2015-08-20 DIAGNOSIS — M05762 Rheumatoid arthritis with rheumatoid factor of left knee without organ or systems involvement: Secondary | ICD-10-CM | POA: Diagnosis not present

## 2015-08-20 DIAGNOSIS — M05761 Rheumatoid arthritis with rheumatoid factor of right knee without organ or systems involvement: Secondary | ICD-10-CM | POA: Diagnosis not present

## 2015-08-20 DIAGNOSIS — M1990 Unspecified osteoarthritis, unspecified site: Secondary | ICD-10-CM | POA: Diagnosis not present

## 2015-08-20 DIAGNOSIS — D631 Anemia in chronic kidney disease: Secondary | ICD-10-CM | POA: Diagnosis not present

## 2015-08-20 DIAGNOSIS — R2681 Unsteadiness on feet: Secondary | ICD-10-CM | POA: Diagnosis not present

## 2015-08-20 DIAGNOSIS — M549 Dorsalgia, unspecified: Secondary | ICD-10-CM | POA: Diagnosis not present

## 2015-08-20 DIAGNOSIS — R609 Edema, unspecified: Secondary | ICD-10-CM | POA: Diagnosis not present

## 2015-08-20 DIAGNOSIS — J9601 Acute respiratory failure with hypoxia: Secondary | ICD-10-CM | POA: Diagnosis not present

## 2015-08-20 DIAGNOSIS — L97909 Non-pressure chronic ulcer of unspecified part of unspecified lower leg with unspecified severity: Secondary | ICD-10-CM | POA: Diagnosis not present

## 2015-08-20 DIAGNOSIS — M6281 Muscle weakness (generalized): Secondary | ICD-10-CM | POA: Diagnosis not present

## 2015-08-20 DIAGNOSIS — I83029 Varicose veins of left lower extremity with ulcer of unspecified site: Secondary | ICD-10-CM | POA: Diagnosis not present

## 2015-08-20 DIAGNOSIS — I428 Other cardiomyopathies: Secondary | ICD-10-CM | POA: Diagnosis not present

## 2015-08-20 DIAGNOSIS — I5032 Chronic diastolic (congestive) heart failure: Secondary | ICD-10-CM | POA: Insufficient documentation

## 2015-08-20 DIAGNOSIS — I129 Hypertensive chronic kidney disease with stage 1 through stage 4 chronic kidney disease, or unspecified chronic kidney disease: Secondary | ICD-10-CM | POA: Diagnosis not present

## 2015-08-20 DIAGNOSIS — I5022 Chronic systolic (congestive) heart failure: Secondary | ICD-10-CM | POA: Diagnosis not present

## 2015-08-20 DIAGNOSIS — I5033 Acute on chronic diastolic (congestive) heart failure: Secondary | ICD-10-CM | POA: Diagnosis not present

## 2015-08-20 DIAGNOSIS — I10 Essential (primary) hypertension: Secondary | ICD-10-CM | POA: Diagnosis not present

## 2015-08-20 DIAGNOSIS — I83009 Varicose veins of unspecified lower extremity with ulcer of unspecified site: Secondary | ICD-10-CM | POA: Diagnosis not present

## 2015-08-20 DIAGNOSIS — S0510XA Contusion of eyeball and orbital tissues, unspecified eye, initial encounter: Secondary | ICD-10-CM | POA: Diagnosis not present

## 2015-08-20 DIAGNOSIS — M79671 Pain in right foot: Secondary | ICD-10-CM | POA: Diagnosis not present

## 2015-08-20 DIAGNOSIS — I5043 Acute on chronic combined systolic (congestive) and diastolic (congestive) heart failure: Secondary | ICD-10-CM | POA: Diagnosis not present

## 2015-08-20 DIAGNOSIS — N179 Acute kidney failure, unspecified: Secondary | ICD-10-CM | POA: Diagnosis not present

## 2015-08-20 DIAGNOSIS — E669 Obesity, unspecified: Secondary | ICD-10-CM | POA: Diagnosis not present

## 2015-08-20 DIAGNOSIS — R2689 Other abnormalities of gait and mobility: Secondary | ICD-10-CM | POA: Diagnosis not present

## 2015-08-20 DIAGNOSIS — N184 Chronic kidney disease, stage 4 (severe): Secondary | ICD-10-CM | POA: Diagnosis not present

## 2015-08-20 DIAGNOSIS — I420 Dilated cardiomyopathy: Secondary | ICD-10-CM | POA: Diagnosis not present

## 2015-08-20 DIAGNOSIS — I429 Cardiomyopathy, unspecified: Secondary | ICD-10-CM | POA: Diagnosis not present

## 2015-08-20 DIAGNOSIS — J9602 Acute respiratory failure with hypercapnia: Secondary | ICD-10-CM | POA: Diagnosis not present

## 2015-08-20 DIAGNOSIS — M25561 Pain in right knee: Secondary | ICD-10-CM | POA: Diagnosis not present

## 2015-08-20 DIAGNOSIS — M130 Polyarthritis, unspecified: Secondary | ICD-10-CM | POA: Diagnosis not present

## 2015-08-20 DIAGNOSIS — I83229 Varicose veins of left lower extremity with both ulcer of unspecified site and inflammation: Secondary | ICD-10-CM | POA: Diagnosis not present

## 2015-08-20 DIAGNOSIS — Z9181 History of falling: Secondary | ICD-10-CM | POA: Diagnosis not present

## 2015-08-20 DIAGNOSIS — I1310 Hypertensive heart and chronic kidney disease without heart failure, with stage 1 through stage 4 chronic kidney disease, or unspecified chronic kidney disease: Secondary | ICD-10-CM | POA: Diagnosis not present

## 2015-08-20 DIAGNOSIS — R41841 Cognitive communication deficit: Secondary | ICD-10-CM | POA: Diagnosis not present

## 2015-08-20 DIAGNOSIS — D649 Anemia, unspecified: Secondary | ICD-10-CM | POA: Diagnosis not present

## 2015-08-20 DIAGNOSIS — N189 Chronic kidney disease, unspecified: Secondary | ICD-10-CM | POA: Diagnosis not present

## 2015-08-20 DIAGNOSIS — R739 Hyperglycemia, unspecified: Secondary | ICD-10-CM | POA: Diagnosis not present

## 2015-08-20 DIAGNOSIS — R29898 Other symptoms and signs involving the musculoskeletal system: Secondary | ICD-10-CM | POA: Diagnosis not present

## 2015-08-20 LAB — CBC
HEMATOCRIT: 25.8 % — AB (ref 39.0–52.0)
Hemoglobin: 7.7 g/dL — ABNORMAL LOW (ref 13.0–17.0)
MCH: 24.8 pg — ABNORMAL LOW (ref 26.0–34.0)
MCHC: 29.8 g/dL — AB (ref 30.0–36.0)
MCV: 83.2 fL (ref 78.0–100.0)
Platelets: 288 10*3/uL (ref 150–400)
RBC: 3.1 MIL/uL — ABNORMAL LOW (ref 4.22–5.81)
RDW: 17.5 % — AB (ref 11.5–15.5)
WBC: 13.7 10*3/uL — ABNORMAL HIGH (ref 4.0–10.5)

## 2015-08-20 LAB — RENAL FUNCTION PANEL
ANION GAP: 6 (ref 5–15)
Albumin: 2.4 g/dL — ABNORMAL LOW (ref 3.5–5.0)
BUN: 85 mg/dL — ABNORMAL HIGH (ref 6–20)
CO2: 34 mmol/L — AB (ref 22–32)
Calcium: 9 mg/dL (ref 8.9–10.3)
Chloride: 103 mmol/L (ref 101–111)
Creatinine, Ser: 3.28 mg/dL — ABNORMAL HIGH (ref 0.61–1.24)
GFR calc Af Amer: 23 mL/min — ABNORMAL LOW (ref >60)
GFR calc non Af Amer: 20 mL/min — ABNORMAL LOW (ref >60)
GLUCOSE: 114 mg/dL — AB (ref 65–99)
PHOSPHORUS: 3.2 mg/dL (ref 2.5–4.6)
POTASSIUM: 4.6 mmol/L (ref 3.5–5.1)
Sodium: 143 mmol/L (ref 135–145)

## 2015-08-20 LAB — GLUCOSE, CAPILLARY
Glucose-Capillary: 104 mg/dL — ABNORMAL HIGH (ref 65–99)
Glucose-Capillary: 75 mg/dL (ref 65–99)
Glucose-Capillary: 167 mg/dL — ABNORMAL HIGH (ref 65–99)
Glucose-Capillary: 250 mg/dL — ABNORMAL HIGH (ref 65–99)

## 2015-08-20 LAB — MAGNESIUM: Magnesium: 2.1 mg/dL (ref 1.7–2.4)

## 2015-08-20 MED ORDER — FERROUS SULFATE 325 (65 FE) MG PO TBEC: 325.0000 mg | DELAYED_RELEASE_TABLET | Freq: Every day | ORAL | Status: DC

## 2015-08-20 MED ORDER — ISOSORBIDE MONONITRATE ER 30 MG PO TB24: 30.0000 mg | ORAL_TABLET | Freq: Every day | ORAL | Status: DC

## 2015-08-20 MED ORDER — PREDNISONE 20 MG PO TABS: 60.0000 mg | ORAL_TABLET | Freq: Every day | ORAL | Status: DC

## 2015-08-20 MED ORDER — CARVEDILOL 12.5 MG PO TABS: 12.5000 mg | ORAL_TABLET | Freq: Two times a day (BID) | ORAL | Status: DC

## 2015-08-20 MED ORDER — HYDRALAZINE HCL 25 MG PO TABS: 25.0000 mg | ORAL_TABLET | Freq: Three times a day (TID) | ORAL | Status: DC

## 2015-08-20 NOTE — Progress Notes (Signed)
Called and gave report to Mardene Celeste at [facility], all questions answered.

## 2015-08-20 NOTE — Progress Notes (Signed)
Patient transfered to EMS stretcher per staff and EMS.  Hospital paperwork given to EMS.

## 2015-08-20 NOTE — Discharge Summary (Addendum)
Physician Discharge Summary  Noah Garner CVE:[id number] DOB: 12/27/1960 DOA: 08/10/2015  PCP: Nilda Simmer, MD  Admit date: 08/10/2015 Discharge date: 08/20/2015  Recommendations for Outpatient Follow-up:  1. F/u with nephrology at already scheduled appointment.  Will need eventual HD access 2. F/u with cardiology in 2-3 weeks for possible ischemia w/u depending on kidney function.  Address BP. 3. Rheumatology f/u within 1 month 4. Gastroenterology f/u in 1 month for iron-deficiency anemia 5. Repeat BMP and CBC in 1 week, to be sent to Dr. Florene Glen, Nephrology (see information below) for review 6. nigntly bipap:  IP 15 mmHg and EP 8 mmHg, 3L [place] 7. Slow prednisone taper 46m daily through 12/11, then 528mdaily x 1 week, then 4027m 1 week, etc until follow up with rheumatology  Discharge Diagnoses:  Principal Problem:   Acute respiratory failure with hypoxia and hypercapnia (HCC[place] City[place] Problems:   AKI (acute kidney injury) ([place] Acute on chronic combined systolic and diastolic heart failure (HCC)   Hypertension   Arthritis   Stasis ulcer ([place] Metabolic encephalopathy   Morbid obesity with alveolar hypoventilation (HCC)   Debilitated patient   NICM (nonischemic cardiomyopathy) ([place] Acute respiratory failure ([place] Encounter for central line placement   Fall at home   [facility] Discharge Condition: stable, improved  Diet recommendation:  Low sodium  Wt Readings from Last 3 Encounters:  08/17/15 174.181 kg (384 lb)    History of present illness and brief summary:  The patient is a 54 41ar old male with history of morbid obesity, positive rheumatoid factor, chronic systolic heart failure with ejection fraction of 30-35%, hypertension, who [facility] with recurrent falls, inability to ambulate, lower back and right hip pain, bilateral lower extremity edema and incontinence. On admission, his labs were notable for  elevated creatinine of 4.6, significant lower extremity edema with a left lower extremity ulcer.  He was started on diuretics and started diuresing, however, on 11/30 in the morning, he was found to be somnolent secondary to hypercapnic and hypoxic respiratory failure and he was transferred to ICU.  He was placed on bipap, however, he avoided intubation.  Cardiology and Nephrology assisted with management, particularly with balancing diuresis with his AKI.    Hospital Course:   Acute on chronic systolic heart failure, EF 30-35%, grade 1 DD, severe LVE and mild LVH.  He was initially diuresed with lasix 109m75m, however, despite brisk diuresis initially, he had progressive respiratory failure eventually requiring continuous bipap for several days with slow recovery.  After he was able to tolerate PO again, he was started on hydralazine and imdur per cardiology.  He is carvedilol. No ACEI/ARB, spironolactone secondary to AoCKD.  His weight on the day of discharge is 174 kg, down from 184 kg on admission.  Cardiology suspects that his heart failure is nonischemic, however, he needs a formal evaluation for ischemia once his kidney function stabilizes.  Follow up with cardiology in 2-3 weeks for reevaluation.  He was discharged on his lasix 60mg21mBID at the time of discharge at the recommendation of nephrology.  Continue daily weights and call nephrology office for weight gain of 3-lbs in 1 day or 5-lbs from the time of discharge.  Repeat labs in 1 week by nephrology.    Acute metabolic encephalopathy due to hypercapneic respiratory failure, resolved.     Acute on chronic hypercapneic and hypoxic respiratory failure due to untreated  OSA and acute on chronic systolic heart failure, resolved with several days of bipap and diuresis.  He likely has OSA and should continue nightly bipap.  He should continue oxygen during the day at 3L which can be gradually titrated down as tolerated keeping O2 sat > 92%.    Fall  and weakness likely due to chronic illness and worsening heart failure in the setting of morbid obesity.  He was seen by PT/OT who recommended SNF for ongoing rehabiliation.  MRI of thoracolumbar spine and x-rays done on admission were negative for acute finding.   Left lower extremity full-thickness ulcer 1.4 x 4 x 0.4 centimeters.  Wound care recommended calcium alginate dressings 3 times a week.  Migratory arthritis, RF mildly elevated at 19 (upper limit of 13), ESR > 140.  He was started on solumedrol and transitioned to prednisone 23m daily.  Continue prednisone 662mdaily through 12/11, then decrease to 5075maily x 1 week, then 77m39mily x 1 week, etc.  Follow up with rheumatology within one month.   ANCA, ANA, and anti-GBM negative.    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  RUS with mild cortical thinning of both kidneys.  He should continue lasix 60mg63mBID.  Will need eval for permanent HD access.    Body mass index is 56.75 kg/(m^2).  Life-threatening.   Essential hypertension.  No ACE inhibitor or ARB due to AKI/CKD.  He was started on coreg, hydralazine and imdur.    Anemia probably of renal disease and iron deficiency.  Iron level was 15 with sat of 8%.  He was given IV iron on 11/30 and a prescription for oral iron as outpatient.  He will need outpatient GI evaluation for iron deficiency anemia.  Vitamin B12 and folate were wnl.    STUDIES:  11/29 ECHO >> severe LV dilation, mild LVH, moderate to severe reduction of systolic dysfunction with an LVEF of 30-3590-24%de 1 diastolic dysfunction 07/3008/73l US >>[place] cortical thinning in both kidneys, no hydronephrosis.  12/1 CT Chest>> mild bibasilar atelectasis, no evidence of pneumonitis   CULTURES: 11/30 MRSA PCR >> positive  11/30 BCx2 >>   ANTIBIOTICS: Zosyn 11/30 >> 12/2  SIGNIFICANT EVENTS: 11/26 Admit with weakness, swelling, falls. Found to have AKI,  anemia, volume overload.  LINES/TUBES: Left IJ HD cath 12/1>>> 12/5  Discharge Exam: Filed Vitals:   08/20/15 0324 08/20/15 0400  BP: 146/82   Pulse: 81   Temp:  98.1 F (36.7 C)  Resp: 17    Filed Vitals:   08/19/15 1924 08/19/15 2345 08/20/15 0324 08/20/15 0400  BP:   146/82   Pulse:   81   Temp: 98.9 F (37.2 C) 98.8 F (37.1 C)  98.1 F (36.7 C)  TempSrc: Oral Oral  Oral  Resp:   17   Height:      Weight:      SpO2: 99% 99% 92% 98%     General: Adult male, obese, No acute distress  HEENT: NCAT, MMM, central line located in the left neck  Cardiovascular: RRR, nl S1, S2 no mrg, 2+ pulses, warm extremities  Respiratory: Distant breath sounds, no increased WOB  Abdomen: NABS, soft, NT/ND  MSK: Normal tone and bulk, woody bilateral LEE, 2 cm ulcer on the anterior left lower shin without erythema or induration or discharge  Discharge Instructions      Discharge Instructions    (HEART FAILURE PATIENTS) Call MD:  Anytime you have any of the following symptoms: 1) 3 pound weight gain in 24 hours or 5 pounds in 1 week 2) shortness of breath, with or without a dry hacking cough 3) swelling in the hands, feet or stomach 4) if you have to sleep on extra pillows at night in order to breathe.    Complete by:  As directed      Call MD for:  difficulty breathing, headache or visual disturbances    Complete by:  As directed      Call MD for:  extreme fatigue    Complete by:  As directed      Call MD for:  hives    Complete by:  As directed      Call MD for:  persistant dizziness or light-headedness    Complete by:  As directed      Call MD for:  persistant nausea and vomiting    Complete by:  As directed      Call MD for:  severe uncontrolled pain    Complete by:  As directed      Call MD for:  temperature >100.4    Complete by:  As directed      Diet - low sodium heart healthy    Complete by:  As directed      Increase activity slowly    Complete by:  As  directed             Medication List    TAKE these medications        carvedilol 12.5 MG tablet  Commonly known as:  COREG  Take 1 tablet (12.5 mg total) by mouth 2 (two) times daily with a meal.     ferrous sulfate 325 (65 FE) MG EC tablet  Take 1 tablet (325 mg total) by mouth daily with breakfast.     furosemide 40 MG tablet  Commonly known as:  LASIX  Take 60 mg by mouth 2 (two) times daily.     hydrALAZINE 25 MG tablet  Commonly known as:  APRESOLINE  Take 1 tablet (25 mg total) by mouth every 8 (eight) hours.     isosorbide mononitrate 30 MG 24 hr tablet  Commonly known as:  IMDUR  Take 1 tablet (30 mg total) by mouth daily.     predniSONE 20 MG tablet  Commonly known as:  DELTASONE  Take 3 tablets (60 mg total) by mouth daily with breakfast.       Follow-up Information    Follow up with [facility], MD In 1 month.   Specialty:  Family Medicine   Contact information:   [address] [facility] [place] [address] [phone]       Follow up with [facility]. Schedule an appointment as soon as possible for a visit in 3 weeks.   Contact information:   [place] [address] [phone]      Follow up with Estanislado Emms, MD. Schedule an appointment as soon as possible for a visit on 09/05/2015.   Specialty:  Nephrology   Why:  10AM for 10:30AM appointment   Contact information:   [address]                          [place]  [address] [phone]       Follow up with [facility] Rheumatology. Schedule an appointment as soon  as possible for a visit in 3 weeks.   Contact information:   [place] [address] [phone]        The results of significant diagnostics from this hospitalization (including imaging, microbiology, ancillary and laboratory) are listed below for reference.    Significant  Diagnostic Studies: Dg Chest 2 View  08/12/2015  CLINICAL DATA:  54 year old male with diastolic congestive heart failure, acute on chronic. Initial encounter. EXAM: CHEST  2 VIEW COMPARISON:  Report of [facility] chest radiographs 12/18/2013 (no images available). FINDINGS: Upright AP and lateral views of the chest. There is cardiomegaly. Other mediastinal contours are within normal limits. No pneumothorax, pulmonary edema, pleural effusion or consolidation. Visualized tracheal air column is within normal limits. No acute osseous abnormality identified. IMPRESSION: Cardiomegaly.  No acute cardiopulmonary abnormality. Electronically Signed   By: Genevie Ann M.D.   On: 08/12/2015 16:51   Dg Thoracic Spine 2 View  08/10/2015  CLINICAL DATA:  Back pain after a fall today. EXAM: THORACIC SPINE 2 VIEWS COMPARISON:  None. FINDINGS: Normal alignment of the thoracic spine. Diffuse degenerative changes with bridging anterior osteophytes throughout. No vertebral compression deformities. Bone cortex appears intact. No focal bone lesion or bone destruction. No paraspinal soft tissue swelling. IMPRESSION: Diffuse degenerative change throughout the thoracic spine with bridging anterior osteophytes. No acute displaced fractures identified. Electronically Signed   By: Lucienne Capers M.D.   On: 08/10/2015 19:09   Dg Lumbar Spine 2-3 Views  08/10/2015  CLINICAL DATA:  Fall, back pain EXAM: LUMBAR SPINE - 2-3 VIEW COMPARISON:  None. FINDINGS: Two views of lumbar spine submitted. No acute fracture or subluxation. Multiple level anterior endplate osteophytes. Bridging anterior osteophytes at L1-L2 and L2-L3 level. Right lateral osteophytes noted at L2-L3 level. Disc space flattening at L4-L5 and L5-S1 level. Facet degenerative changes L4 and L5 level. IMPRESSION: No acute fracture or subluxation. Multilevel degenerative changes as described above. Electronically Signed   By: Lahoma Crocker M.D.   On: 08/10/2015 19:11   Dg  Knee 1-2 Views Right  08/11/2015  CLINICAL DATA:  54 year old male with history of arthritis and right knee pain and swelling EXAM: RIGHT KNEE - 1-2 VIEW COMPARISON:  Concurrently obtained radiographs of the right ankle FINDINGS: Advanced tricompartmental degenerative osteoarthritis most severe in the medial and patellofemoral compartments. There is significant narrowing in the medial compartment with near bone-on-bone contact. No acute fracture or malalignment. No lytic or blastic osseous lesion. Moderate suprapatellar knee joint effusion. IMPRESSION: 1. Moderate suprapatellar knee joint effusion is favored to be degenerative in origin. In the appropriate clinical setting, infection or hemarthrosis are difficult to exclude radiographically. 2. Advanced tricompartmental osteoarthritis most severe in the patellofemoral and medial compartments. Electronically Signed   By: Jacqulynn Cadet M.D.   On: 08/11/2015 11:03   Dg Ankle 2 Views Right  08/11/2015  CLINICAL DATA:  Swelling and pain right knee and ankle. EXAM: RIGHT ANKLE - 2 VIEW COMPARISON:  None. FINDINGS: Soft tissue swelling over the ankle. Ankle mortise is within normal. Moderate degenerative change over the midfoot and mild degenerative changes of the hindfoot. No acute fracture or dislocation. IMPRESSION: No acute findings. Electronically Signed   By: Marin Olp M.D.   On: 08/11/2015 11:08   Ct Head Wo Contrast  08/14/2015  CLINICAL DATA:  Altered mental status following fall earlier today EXAM: CT HEAD WITHOUT CONTRAST TECHNIQUE: Contiguous axial images were obtained from the base of the skull through the vertex without  intravenous contrast. COMPARISON:  None. FINDINGS: The ventricles are normal in size and configuration. There is no intracranial mass, hemorrhage, extra-axial fluid collection, or midline shift. Gray-white compartments appear normal. No acute infarct evident. Bony calvarium appears intact. The mastoid air cells are clear. No  intraorbital lesions are identified. IMPRESSION: Study within normal limits. Electronically Signed   By: Lowella Grip III M.D.   On: 08/14/2015 15:52   Ct Chest Wo Contrast  08/15/2015  CLINICAL DATA:  Dyspnea.  History of CHF and hypertension EXAM: CT CHEST WITHOUT CONTRAST TECHNIQUE: Multidetector CT imaging of the chest was performed following the standard protocol without IV contrast. COMPARISON:  None. FINDINGS: The central airways are patent. There is no focal consolidation. There is mild bibasilar atelectasis. There is no pleural effusion or pneumothorax. There are no pathologically enlarged axillary, hilar or mediastinal lymph nodes. The heart size is normal. There is no pericardial effusion. The thoracic aorta is normal in caliber. Left IJ central venous catheter with the tip projecting over the left brachycephalic vein. Review of bone windows demonstrates no focal lytic or sclerotic lesions. There is a chronic T11 vertebral body compression fracture. Limited non-contrast images of the upper abdomen were obtained. The adrenal glands appear normal. The remainder of the visualized abdominal organs are unremarkable. IMPRESSION: No acute cardiopulmonary process. Electronically Signed   By: Kathreen Devoid   On: 08/15/2015 19:31   Mr Thoracic Spine Wo Contrast  08/10/2015  CLINICAL DATA:  Initial evaluation for acute low back pain with right hip pain. Incontinence. Status post fall. EXAM: MRI THORACIC SPINE WITHOUT CONTRAST TECHNIQUE: Multiplanar, multisequence MR imaging of the thoracic spine was performed. No intravenous contrast was administered. COMPARISON:  Prior radiograph from earlier the same day. FINDINGS: Vertebral bodies are normally aligned with preservation of the normal thoracic kyphosis. There is mild chronic height loss at the superior endplate of E28, chronic in nature. No associated retropulsion. Small Schmorl's nodes at the superior endplates of T2 and M03. Minimal edema about the  Schmorl's node at T12. Otherwise, vertebral body heights are well maintained. No fracture. Mild diffuse decreased it T1 signal intensity throughout the vertebral body bone marrow noted, likely within normal limits. No focal osseous lesions. No marrow edema. Signal intensity within the thoracic spinal cord is within normal limits. No paraspinous soft tissue abnormality. Diffusely flowing osteophytes seen throughout the mid and lower thoracic spine, suggesting DISH. No significant disc bulge within the thoracic spine. No significant canal or foraminal stenosis. IMPRESSION: 1. No acute abnormality within the thoracic spine. No cord compression or significant stenosis. 2. Mild chronic height loss at the superior endplate of K91. 3. Diffusely flowing bridging osteophytes throughout the mid and lower thoracic spine, suggesting DISH. Electronically Signed   By: Jeannine Boga M.D.   On: 08/10/2015 22:54   Mr Lumbar Spine Wo Contrast  08/10/2015  CLINICAL DATA:  Initial evaluation for acute low back pain with pain and right hip status post fall, weakness. EXAM: MRI LUMBAR SPINE WITHOUT CONTRAST TECHNIQUE: Multiplanar, multisequence MR imaging of the lumbar spine was performed. No intravenous contrast was administered. COMPARISON:  Prior radiograph from earlier the same day. FINDINGS: Study is limited by body habitus. The purposes of this dictation, the lowest well-formed intervertebral disc spaces presumed to be the L5-S1 level, and there presumed to be 5 lumbar type vertebral bodies. Vertebral bodies are normally aligned with preservation of the normal lumbar lordosis. Vertebral body heights are maintained. No fracture. Somewhat diffusely decreased T1 signal intensity within  the visualized vertebral body bone marrow, but still likely within normal limits. Conus medullaris terminates normally at the T12-L1 level. Signal intensity within the visualized cord is normal. Nerve roots of the cauda equina are normal in  appearance. Paraspinous soft tissues demonstrate no acute abnormality. Diffuse congenital shortening of the pedicles present. No significant degenerative pathology present at T11-12 and T12-L1. L1-2: Degenerative disc desiccation with minimal disc bulge. Prominent osteophytic spurring along the right anterolateral aspect of the L1-2 disc space. No focal disc herniation. No significant canal stenosis. Mild edema about the right L1-2 facet likely related to degenerative changes. Very mild right foraminal. No neural impingement. L2-3:  Negative. L3-4: No disc bulge or disc protrusion. Intervertebral disc is well hydrated. Bilateral facet arthrosis, right greater than left. No significant canal or foraminal stenosis. No neural impingement. L4-5: Diffuse disc bulge with disc desiccation. There is a small shallow superimposed central disc protrusion which indents the ventral thecal sac. Mild bilateral facet arthrosis. These changes superimposed on Detavious Rinn pedicles results in moderate canal stenosis. Mild left foraminal narrowing present. L5-S1: No disc bulge or disc protrusion. Very mild bilateral facet hypertrophy. No significant canal or foraminal stenosis. IMPRESSION: 1. No acute abnormality within the lumbar spine. 2. Shallow central disc protrusion at L4-5, which superimposed on mild facet arthrosis and Andree Heeg pedicles results in moderate canal stenosis. No other significant canal narrowing within the lumbar spine. No cord compression. 3. Severe right-sided facet arthrosis at L1-2 with associated reactive edema. 4. Mild degenerative disc bulge at L1-2 without stenosis. 5. Diffuse congenital shortening of the pedicles. Electronically Signed   By: Jeannine Boga M.D.   On: 08/10/2015 22:15   US Renal  08/14/2015  CLINICAL DATA:  Chronic kidney disease and hypertension. EXAM: RENAL / URINARY TRACT ULTRASOUND COMPLETE COMPARISON:  None. FINDINGS: Right Kidney: Length: 11.9 cm.  Mild cortical thinning without  hydronephrosis. Left Kidney: Length: 11.5 cm.  Mild cortical thinning without hydronephrosis. Bladder: Appears normal for degree of bladder distention. IMPRESSION: Mild cortical thinning in both kidneys. No hydronephrosis. Electronically Signed   By: Markus Daft M.D.   On: 08/14/2015 07:40   Dg Chest Port 1 View  08/17/2015  CLINICAL DATA:  Pulmonary edema. History of congestive heart failure and hypertension. EXAM: PORTABLE CHEST 1 VIEW COMPARISON:  Radiographs 08/16/2015.  CT 08/15/2015. FINDINGS: 0530 hours. Left IJ central venous catheter is unchanged at the level of the confluence of the brachiocephalic veins. There is stable cardiomegaly and vascular congestion. Basilar aeration has minimally worsened. There is no confluent airspace opacity or significant pleural effusion. The bones appear unchanged. IMPRESSION: Minimal worsening of bibasilar aeration.  Stable cardiomegaly. Electronically Signed   By: Richardean Sale M.D.   On: 08/17/2015 09:12   Dg Chest Port 1 View  08/16/2015  CLINICAL DATA:  Shortness of breath. EXAM: PORTABLE CHEST 1 VIEW COMPARISON:  CT 08/15/2015.  Chest x-ray 08/15/2015 . FINDINGS: Left IJ line noted in good anatomic position . Cardiomegaly . Mild pulmonary vascular prominence. Mild right base subsegmental atelectasis. Small left pleural effusion cannot be excluded. No pneumothorax . IMPRESSION: 1. Left IJ line in stable position. 2. Stable prominent cardiomegaly. Mild pulmonary vascular prominence. Small left pleural effusion. 3. Mild right base subsegmental atelectasis. Electronically Signed   By: Marcello Moores  Register   On: 08/16/2015 07:23   Dg Chest Port 1 View  08/15/2015  CLINICAL DATA:  Central catheter placement EXAM: PORTABLE CHEST 1 VIEW COMPARISON:  Study obtained earlier in the day FINDINGS: Central catheter tip  is at the junction of the left innominate vein and superior vena cava. No pneumothorax. There is no edema or consolidation. There is stable cardiomegaly. The  pulmonary vascularity is within normal limits. No adenopathy. IMPRESSION: Central catheter tip at junction of left innominate vein and superior vena cava. No pneumothorax. Stable cardiomegaly. No edema or consolidation. Electronically Signed   By: Lowella Grip III M.D.   On: 08/15/2015 15:03   Dg Chest Port 1 View  08/15/2015  CLINICAL DATA:  Acute respiratory failure EXAM: PORTABLE CHEST 1 VIEW COMPARISON:  Yesterday FINDINGS: Chronic cardiopericardial enlargement and vascular pedicle widening. Borderline pulmonary venous congestion; no edema. There is no consolidation, effusion, or pneumothorax. IMPRESSION: 1. No acute finding. 2. Chronic cardiomegaly. Electronically Signed   By: Monte Fantasia M.D.   On: 08/15/2015 07:16   Dg Chest Port 1 View  08/14/2015  CLINICAL DATA:  Hypoxia.  Hypertension EXAM: PORTABLE CHEST 1 VIEW COMPARISON:  August 12, 2015 FINDINGS: There is no edema or consolidation. There is cardiomegaly. The pulmonary vascularity is normal. No adenopathy. No bone lesions. IMPRESSION: Stable cardiac enlargement.  No edema or consolidation. Electronically Signed   By: Lowella Grip III M.D.   On: 08/14/2015 11:08    Microbiology: Recent Results (from the past 240 hour(s))  Culture, blood (x 2)     Status: None   Collection Time: 08/14/15 11:01 AM  Result Value Ref Range Status   Specimen Description BLOOD LEFT HAND  Final   Special Requests BOTTLES DRAWN AEROBIC AND ANAEROBIC [place]  Final   Culture NO GROWTH 5 DAYS  Final   Report Status 08/19/2015 FINAL  Final  Culture, blood (x 2)     Status: None   Collection Time: 08/14/15 11:04 AM  Result Value Ref Range Status   Specimen Description BLOOD RIGHT HAND  Final   Special Requests BOTTLES DRAWN AEROBIC AND ANAEROBIC 10CC  Final   Culture NO GROWTH 5 DAYS  Final   Report Status 08/19/2015 FINAL  Final  MRSA PCR Screening     Status: Abnormal   Collection Time: 08/14/15 12:16 PM  Result Value Ref Range Status    MRSA by PCR POSITIVE (A) NEGATIVE Final    Comment:        The GeneXpert MRSA Assay (FDA approved for NASAL specimens only), is one component of a comprehensive MRSA colonization surveillance program. It is not intended to diagnose MRSA infection nor to guide or monitor treatment for MRSA infections. RESULT CALLED TO, READ BACK BY AND VERIFIED WITH: A. BARLOW RN 13:35 08/14/15 ([email or web address])      Labs: Basic Metabolic Panel:  Recent Labs Lab 08/16/15 0356 08/17/15 0232 08/18/15 0335 08/19/15 0425 08/20/15 0423  NA 144 146* 144 142 143  K 4.4 4.6 4.7 4.9 4.6  CL 96* 101 102 99* 103  CO2 29 34* 33* 32 34*  GLUCOSE 162* 143* 138* 122* 114*  BUN 105* 114* 109* 101* 85*  CREATININE 5.09* 4.89* 4.28* 3.61* 3.28*  CALCIUM 7.9* 8.6* 8.6* 8.9 9.0  MG 2.0 2.2 2.1 2.1 2.1  PHOS 6.3* 5.1* 3.7 3.3 3.2   Liver Function Tests:  Recent Labs Lab 08/14/15 0440  08/16/15 0356 08/17/15 0232 08/18/15 0335 08/19/15 0425 08/20/15 0423  AST 27  --   --   --   --   --   --   ALT 25  --   --   --   --   --   --   ALKPHOS 70  --   --   --   --   --   --  BILITOT 1.0  --   --   --   --   --   --   PROT 7.7  --   --   --   --   --   --   ALBUMIN 2.3*  < > 1.9* 2.1* 2.1* 2.3* 2.4*  < > = values in this interval not displayed. No results for input(s): LIPASE, AMYLASE in the last 168 hours.  Recent Labs Lab 08/14/15 0440  AMMONIA 37*   CBC:  Recent Labs Lab 08/14/15 0440  08/15/15 2308 08/16/15 0356 08/17/15 0232 08/18/15 0335 08/19/15 0425 08/20/15 0423  WBC 20.7*  < > 15.2* 14.3* 15.7* 14.2* 15.2* 13.7*  NEUTROABS 16.6*  --  13.1*  --   --   --   --   --   HGB 7.8*  < > 8.1* 7.1* 8.0* 7.7* 8.0* 7.7*  HCT 23.8*  < > 25.7* 23.4* 26.1* 25.8* 25.9* 25.8*  MCV 79.3  < > 82.1 81.8 82.1 83.0 83.0 83.2  PLT 292  < > 330 340 367 335 304 288  < > = values in this interval not displayed. Cardiac Enzymes: No results for input(s): CKTOTAL, CKMB, CKMBINDEX, TROPONINI in the last 168  hours. BNP: BNP (last 3 results) No results for input(s): BNP in the last 8760 hours.  ProBNP (last 3 results) No results for input(s): PROBNP in the last 8760 hours.  CBG:  Recent Labs Lab 08/19/15 1638 08/19/15 1921 08/19/15 2343 08/20/15 0345 08/20/15 0757  GLUCAP 134* 141* 103* 104* 75    Time coordinating discharge: 35 minutes  Signed:  Malashia Kamaka  [facility] 08/20/2015, 9:30 AM

## 2015-08-20 NOTE — Care Management Important Message (Signed)
Important Message  Patient Details  Name: [name] MRN: [id number] Date of Birth: 12/02/1960   Medicare Important Message Given:  Yes    Bethena Roys, RN 08/20/2015, 11:48 AM

## 2015-08-20 NOTE — Plan of Care (Signed)
Problem: Consults Goal: Skin Care Protocol Initiated - if Braden Score 18 or less If consults are not indicated, leave blank or document N/A Outcome: Completed/Met Date Met:  08/20/15 Skin care protocol initated Goal: Diabetes Guidelines if Diabetic/Glucose > 140 If diabetic or lab glucose is > 140 mg/dl - Initiate Diabetes/Hyperglycemia Guidelines & Document Interventions  Outcome: Completed/Met Date Met:  08/20/15 Pt receiving correction scale insulin q4hr  Problem: ICU Phase Progression Outcomes Goal: O2 sats trending toward baseline Outcome: Completed/Met Date Met:  08/20/15 Pt sats 96% on 3L [place] Goal: Dyspnea controlled at rest Outcome: Completed/Met Date Met:  08/20/15 Pt has no dyspnea at rest Goal: Pain controlled with appropriate interventions Outcome: Completed/Met Date Met:  08/20/15 Pt has no complaints of pain Goal: Voiding-avoid urinary catheter unless indicated Outcome: Completed/Met Date Met:  08/20/15 Pt voiding on own while using urinal   Problem: Phase I Progression Outcomes Goal: O2 sats > or equal 90% or at baseline Outcome: Completed/Met Date Met:  08/20/15 o2 sats remaining high 90's on 3L [place] Goal: Dyspnea controlled at rest Outcome: Completed/Met Date Met:  08/20/15 Pt has no dyspnea at rest  Problem: Discharge Progression Outcomes Goal: Dyspnea controlled Outcome: Completed/Met Date Met:  08/20/15 Dyspnea controlled Goal: O2 sats > or equal 90% or at baseline Outcome: Completed/Met Date Met:  08/20/15 o2 sat remains in high 90's on 3L [place] Goal: Tolerating diet Outcome: Completed/Met Date Met:  08/20/15 Pt is tolerating diet

## 2015-08-20 NOTE — Progress Notes (Signed)
Patient Name: Neer Hanson Date of Encounter: 08/20/2015  Principal Problem:   Acute respiratory failure with hypoxia and hypercapnia ([place]) Active Problems:   AKI (acute kidney injury) ([place])   Acute on chronic combined systolic and diastolic heart failure (H[facility])   Hypertension   Arthritis   Stasis ulcer ([place])   Metabolic encephalopathy   Morbid obesity with alveolar hypoventilation (H[facility])   Debilitated patient   NICM (nonischemic cardiomyopathy) ([place])   Acute respiratory failure ([place])   Encounter for central line placement   Fall at home   Rheumatoid arthritis [facility])     Primary Cardiologist: [facility] - Dr. Donnetta Hutching Patient Profile: 54 y/o morbidly obese male with h/o obesity, HTN, CHF and RA admitted for acute on chronic CHF with newly discovered low EF of 30-35% and acute on chronic stage 5 CKD with SCr of 4.47 on admit.   SUBJECTIVE: Denies any chest pain or shortness of breath.   OBJECTIVE Filed Vitals:   08/19/15 1924 08/19/15 2345 08/20/15 0324 08/20/15 0400  BP:   146/82   Pulse:   81   Temp: 98.9 F (37.2 C) 98.8 F (37.1 C)  98.1 F (36.7 C)  TempSrc: Oral Oral  Oral  Resp:   17   Height:      Weight:      SpO2: 99% 99% 92% 98%    Intake/Output Summary (Last 24 hours) at 08/20/15 0901 Last data filed at 08/20/15 0819  Gross per 24 hour  Intake   1320 ml  Output   2075 ml  Net   -755 ml   Filed Weights   08/13/15 0300 08/16/15 0600 08/17/15 0500  Weight: 395 lb (179.171 kg) 383 lb (173.728 kg) 384 lb (174.181 kg)    PHYSICAL EXAM General: Well developed, well nourished, male in no acute distress. Head: Normocephalic, atraumatic.  Neck: Supple without bruits, JVD unable to be assessed secondary to body habitus. Lungs:  Resp regular and unlabored, CTA without wheezing or rales. Heart: RRR, S1, S2, no S3, S4, or murmur; no rub. Abdomen: Soft, non-tender, non-distended with normoactive bowel sounds. No hepatomegaly. No rebound/guarding. No obvious  abdominal masses. Extremities: No clubbing or cyanosis. 1+ edema bilaterally. Distal pedal pulses are 2+ bilaterally. Neuro: Alert and oriented X 3. Moves all extremities spontaneously. Psych: Normal affect.  LABS: CBC:  Recent Labs  08/19/15 0425 08/20/15 0423  WBC 15.2* 13.7*  HGB 8.0* 7.7*  HCT 25.9* 25.8*  MCV 83.0 83.2  PLT 304 288   INR:No results for input(s): INR in the last 72 hours. Basic Metabolic Panel:  Recent Labs  08/19/15 0425 08/20/15 0423  NA 142 143  K 4.9 4.6  CL 99* 103  CO2 32 34*  GLUCOSE 122* 114*  BUN 101* 85*  CREATININE 3.61* 3.28*  CALCIUM 8.9 9.0  MG 2.1 2.1  PHOS 3.3 3.2   Liver Function Tests:  Recent Labs  08/19/15 0425 08/20/15 0423  ALBUMIN 2.3* 2.4*   TELE:  NSR with rate in 80's -90's. 4 beats NSVT overnight. No other atopic events.      ECHO: 2D Echo 08/13/15 Study Conclusions - Left ventricle: The cavity size was severely dilated. Wall thickness was increased in a pattern of mild LVH. Systolic function was moderately to severely reduced. The estimated ejection fraction was in the range of 30% to 35%. Doppler parameters are consistent with abnormal left ventricular relaxation (grade 1 diastolic dysfunction). - Impressions: Extremely limited; definity used but LV function still difficult  to quantitate; moderate to severe global reduction in LV function; grade 1 diastolic dysfunction; severe LVE; mild LVH. Suggest cardiac MRI or MUGA to better assess LV function.  Impressions: - Extremely limited; definity used but LV function still difficult to quantitate; moderate to severe global reduction in LV function; grade 1 diastolic dysfunction; severe LVE; mild LVH. Suggest cardiac MRI or MUGA to better assess LV function.  Current Medications:  . antiseptic oral rinse  7 mL Mouth Rinse q12n4p  . carvedilol  6.25 mg Oral BID WC  . chlorhexidine  15 mL Mouth Rinse BID  . [START ON 08/21/2015]  darbepoetin (ARANESP) injection - NON-DIALYSIS  200 mcg Subcutaneous Q Wed-1800  . heparin  5,000 Units Subcutaneous 3 times per day  . hydrALAZINE  25 mg Oral 3 times per day  . hydrocerin   Topical Daily  . insulin aspart  0-9 Units Subcutaneous 6 times per day  . isosorbide mononitrate  30 mg Oral Daily  . predniSONE  60 mg Oral Q breakfast  . sodium chloride  3 mL Intravenous Q12H   . sodium chloride 10 mL/hr at 08/16/15 [id number]    ASSESSMENT AND PLAN:  1. Acute on Chronic Combined Systolic + Diastolic CHF:  - Etiology of cardiomyopathy unknown. Not a candidate for cath at this time given baseline renal function.  - Net output of -6.0L this admission. Weight down 22 lbs. Patient has refused to stand and weight the past few mornings and bed weight will not work on the patient. Educated about the importance of tracking weight going forward and once discharged. - Continue BB therapy with Coreg. No ACE/ARB given renal function.  - Continue Imdur and hydralazine for afterload reduction.   2. Acute on Chronic Kidney Disease w/ Severe Azotemia:  - creatinine 4.47 on admission. Improved to 3.28 on 08/20/2015. - renal function gradually improving. Nephrology following.   3. Essential Hypertension:  - BP has been 125/71 - 146/82 in the past 24 hours. - controlled on Coreg, Hydralazine and Imdur. Will continue to titrate HF meds as BP allows.   Is being discharged to SNF later today. Will need close follow-up with his Cardiologist (Dr. Donnetta Hutching, [facility]) in the next two weeks.  Signed, Erma Heritage , PA-C 9:01 AM 08/20/2015 Pager: [phone]

## 2015-08-20 NOTE — Clinical Social Work Placement (Signed)
   CLINICAL SOCIAL WORK PLACEMENT  NOTE  Date:  08/20/2015  Patient Details  Name: Noah Garner MRN: [id number] Date of Birth: January 02, 1961  Clinical Social Work is seeking post-discharge placement for this patient at the [place] level of care (*CSW will initial, date and re-position this form in  chart as items are completed):  Yes   Patient/family provided with [facility] Work Department's list of facilities offering this level of care within the geographic area requested by the patient (or if unable, by the patient's family).  Yes   Patient/family informed of their freedom to choose among providers that offer the needed level of care, that participate in Medicare, Medicaid or managed care program needed by the patient, have an available bed and are willing to accept the patient.  Yes   Patient/family informed of [facility]'s ownership interest in [facility] and [facility], as well as of the fact that they are under no obligation to receive care at these facilities.  PASRR submitted to EDS on 08/17/15     PASRR number received on 08/17/15     Existing PASRR number confirmed on       FL2 transmitted to all facilities in geographic area requested by pt/family on 08/17/15     FL2 transmitted to all facilities within larger geographic area on       Patient informed that his/her managed care company has contracts with or will negotiate with certain facilities, including the following:        Yes   Patient/family informed of bed offers received.  Patient chooses bed at [facility]     Physician recommends and patient chooses bed at      Patient to be transferred to [place] on 08/20/15.  Patient to be transferred to facility by Ambulance     Patient family notified on 08/20/15 of transfer.  Name of family member notified:  Sharl Ma (by phone)     PHYSICIAN       Additional Comment:    Barbette Or,  [place]

## 2015-08-20 NOTE — Progress Notes (Signed)
Pt refusing BiPAP at this time, RT informed pt to call for RT if he changes his mind during the night.

## 2015-08-20 NOTE — Progress Notes (Signed)
Pt refuses to get up for standing weight. Bed weight not working on pt. Pt educated. Will continue to monitor.

## 2015-08-20 NOTE — Clinical Social Work Note (Signed)
Clinical Social Worker facilitated patient discharge including contacting patient family and facility to confirm patient discharge plans.  Clinical information faxed to facility and family agreeable with plan.  CSW arranged ambulance transport via [facility] to [place].  RN to call report prior to discharge.  Clinical Social Worker will sign off for now as social work intervention is no longer needed. Please consult [place] again if new need arises.  Barbette Or, [place]

## 2015-08-27 ENCOUNTER — Non-Acute Institutional Stay (SKILLED_NURSING_FACILITY): Payer: Medicare Other | Admitting: Internal Medicine

## 2015-08-27 DIAGNOSIS — I420 Dilated cardiomyopathy: Secondary | ICD-10-CM

## 2015-08-27 DIAGNOSIS — M130 Polyarthritis, unspecified: Secondary | ICD-10-CM | POA: Diagnosis not present

## 2015-08-27 DIAGNOSIS — D631 Anemia in chronic kidney disease: Secondary | ICD-10-CM | POA: Diagnosis not present

## 2015-08-27 DIAGNOSIS — I10 Essential (primary) hypertension: Secondary | ICD-10-CM

## 2015-08-27 DIAGNOSIS — I83229 Varicose veins of left lower extremity with both ulcer of unspecified site and inflammation: Secondary | ICD-10-CM

## 2015-08-27 DIAGNOSIS — N179 Acute kidney failure, unspecified: Secondary | ICD-10-CM

## 2015-08-27 DIAGNOSIS — N189 Chronic kidney disease, unspecified: Secondary | ICD-10-CM

## 2015-08-27 DIAGNOSIS — E662 Morbid (severe) obesity with alveolar hypoventilation: Secondary | ICD-10-CM

## 2015-08-27 DIAGNOSIS — L97929 Non-pressure chronic ulcer of unspecified part of left lower leg with unspecified severity: Secondary | ICD-10-CM

## 2015-08-27 NOTE — Progress Notes (Signed)
Patient ID: Noah Garner, male   DOB: 07-29-1961, 54 y.o.   MRN: [id number]    Facility; [facility] Chief complaint; admission to SNF post admit to [facility] from 11/26 to 08/20/2015  History; this is this is a medically complex 54 year old man who tells me over the last several weeks prior to admission he had increasing pain predominantly but not exclusively in his right knee greater than left knee but also migratory pain in his shoulders hands. He started using a walker 2 weeks before admission had trouble using this due to increasing pain in the legs low back. He had difficulty walking. On admission his labs were notable for an elevated creatinine at 4.6 [he has no knowledge of prior renal failure] significant lower extremity edema left lower extremity venous insufficiency ulcer. He was put on diuretics. 4 days into his admission he was found to be somnolent and in hypercapnic, hypoxic respiratory failure. He required BiPAP in the ICU no intubation. He has a known cardiomyopathy the exact nature of which is uncertain although he has an ejection fraction of 30-35% area he was diuresis, 184 kg 274 kg. It is expected that this is nonischemic. However he will need a formal evaluation. The patient tells me he is not aware of a prior heart attack although several years in [place] he presented with heart failure and accelerated hypertension.  The patient has a number of significant medical issues from the hospitalization including; #1 acute on chronic renal failure although the patient doesn't seem to know anything about his baseline kidney function a renal ultrasound showed mild cortical thinning. He is on Lasix 60 twice a day. He is not a diabetic the nature of the chronic part of the kidney disease is uncertain. The patient tells me he has been hypertensive since he was young and has a family history of this on his father's side. Problem #2 acute on chronic hypercapnic and hypoxic respiratory failure  felt secondary to untreated obesity hyperventilation syndrome and acute on chronic systolic heart failure. #3 low back pain. 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 He was put on Solu-Medrol and transitioned to prednisone and he is now on a taper with a rheumatology follow-up suggested. His Anka, ANA and anti-glomerular basement membrane antibodies were negative. Surprisingly I can't see where he had a total CK are all delays levels and I'll follow-up on this. #5 venous insufficiency with lymphedema and a small innocuous looking venous insufficiency ulcer on the lateral left leg #6 history of long-standing and? Refractory hypertension. Not currently a candidate for ace her arms or Spiriva lactone. He is on Coreg hydralazine and Imdur #7 morbid obesity with question obesity hypoventilation syndrome body mass index is 56.75 #8 anemia L to be secondary to chronic renal insufficiency and iron deficiency combined. His iron level was 15 with a sat of 8%. He was given IV iron and a prescription for oral iron  Past Medical History  Diagnosis Date  . CHF (congestive heart failure) ([place])   . Hypertension   . Arthritis    Past surgical history Remote history of a surgery on the right knee for a meniscus tear  Current Outpatient Prescriptions on File Prior to Visit  Medication Sig Dispense Refill  . carvedilol (COREG) 12.5 MG tablet Take 1 tablet (12.5 mg total) by mouth 2 (two) times daily  with a meal. 60 tablet 0  . ferrous sulfate 325 (65 FE) MG EC tablet Take 1 tablet (325 mg total) by mouth daily with breakfast. 30 tablet 0  . furosemide (LASIX) 40 MG tablet Take 60 mg by mouth 2 (two) times daily.    . hydrALAZINE (APRESOLINE) 25 MG tablet Take 1 tablet (25 mg total) by mouth every 8 (eight) hours. 90 tablet 0  .  isosorbide mononitrate (IMDUR) 30 MG 24 hr tablet Take 1 tablet (30 mg total) by mouth daily. 30 tablet 0  . predniSONE (DELTASONE) 20 MG tablet Take 3 tablets (60 mg total) by mouth daily with breakfast. 30 tablet 0   Social; patient tells me living and [place] with his cousin. He was using a walker for 2-3 weeks prior to admission but with increasing difficulty.  reports that he quit smoking about 20 years ago. He does not have any smokeless tobacco history on file. He reports that he does not drink alcohol or use illicit drugs.  Family history; tells me that he has a history of hypertension on his father's side but cannot clarify this says he is no longer in contact.  Review of systems HEENT no headaches no diplopia Respiratory no baseline respiratory problems that he is aware of he was not on oxygen Cardiac there is no clear ischemic sounding history no exertional chest pain. Did note increasing edema Abdomen; states certain foods give him diarrhea no abdominal pain GU no dysuria no hematuria Musculoskeletal; several years of migratory arthritis predominately involving right greater than left knee but also his hands wrists elbows and shoulders. He specifically denies any neck difficulties Neurologic; states he has numbness in his right foot significant pain in the right knee. Endocrine; denies any diabetes history Mental status; no evidence of depression Gait; states he is been up walking with his walker  Physical examination Gen. patient is a obese man however interactive, cognitively intact friendly HEENT; oral exam is normal, no obvious eye abnormalities Respiratory; shallow air entry bilaterally his O2 sat is 97% on 2 L between 90 and 92% on room air Cardiac; heart sounds are distant no gallops JVP is not elevated no coccyx edema Abdomen; obese no liver no spleen small umbilical hernia. There is no tenderness GU bladder not distended there is no CVA tenderness Neurologic;  cranial nerves are normal including [id number] 12 and 11. He has no pronator drift. Slight flexion weakness 4+ out of 5 otherwise normal. Sensory exam to light touch is normal. Reflexes are brisk almost surprisingly so but not pathologic except for A right Hoffman's reflex. Both plantar responses were flexor Gait; is able to bring himself to a standing position. Looks somewhat antalgic on the right. States he cannot feel his right foot Musculoskeletal; perhaps a mild effusion in the right knee. There is no evidence of active synovitis currently on this dose of prednisone. He has a scaly rash across his metacarpophalangeals on the right which appear to be fading. Extremities; patient has venous insufficiency with inflammation, lymphedema. He has what looks to be a venous insufficiency ulcer on the left leg about the size of a quarter. Not particularly worrisome. Mental status; he was delirious in the hospital at the time of his respiratory failure requiring continuous BiPAP. That appears to be resolved  BMP Latest Ref Rng 08/20/2015 08/19/2015 08/18/2015  Glucose 65 - 99 mg/dL 114(H) 122(H) 138(H)  BUN 6 - 20 mg/dL 85(H) 101(H) 109(H)  Creatinine 0.61 - 1.24 mg/dL 3.28(H) 3.61(H) 4.28(H)  Sodium 135 - 145 mmol/L 143 142 144  Potassium 3.5 - 5.1 mmol/L 4.6 4.9 4.7  Chloride 101 - 111 mmol/L 103 99(L) 102  CO2 22 - 32 mmol/L 34(H) 32 33(H)  Calcium 8.9 - 10.3 mg/dL 9.0 8.9 8.6(L)   CBC Latest Ref Rng 08/20/2015 08/19/2015 08/18/2015  WBC 4.0 - 10.5 K/uL 13.7(H) 15.2(H) 14.2(H)  Hemoglobin 13.0 - 17.0 g/dL 7.7(L) 8.0(L) 7.7(L)  Hematocrit 39.0 - 52.0 % 25.8(L) 25.9(L) 25.8(L)  Platelets 150 - 400 K/uL 288 304 335      Results for BHAVYA, GRAND (MRN [id number]) as of 08/27/2015 09:17  Ref. Range 08/10/2015 19:04 08/10/2015 19:25 08/10/2015 19:49 08/10/2015 21:49 08/10/2015 22:31 08/11/2015 04:54 08/11/2015 10:59 08/11/2015 11:01 08/12/2015 03:17 08/12/2015 16:47 08/13/2015 08:10 08/13/2015 10:37 08/14/2015  04:40  Sodium Latest Ref Range: 135-145 mmol/L  138    139   139  138  138  Potassium Latest Ref Range: 3.5-5.1 mmol/L  4.8    4.2   4.0  4.1  4.4  Chloride Latest Ref Range: 101-111 mmol/L  102    105   104  104  101  CO2 Latest Ref Range: 22-32 mmol/L  '25    25   25  26  27  ' BUN Latest Ref Range: 6-20 mg/dL  80 (H)    76 (H)   71 (H)  69 (H)  76 (H)  Creatinine Latest Ref Range: 0.61-1.24 mg/dL  4.62 (H)    4.51 (H)   4.47 (H)  4.55 (H)  4.52 (H)  Calcium Latest Ref Range: 8.9-10.3 mg/dL  9.0    8.9   8.7 (L)  8.9  9.2  EGFR (Non-African Amer.) Latest Ref Range: >60 mL/min  13 (L)    14 (L)   14 (L)  13 (L)  13 (L)  EGFR (African American) Latest Ref Range: >60 mL/min  15 (L)    16 (L)   16 (L)  15 (L)  16 (L)  Glucose Latest Ref Range: 65-99 mg/dL  78    99   146 (H)  136 (H)  126 (H)  Anion gap Latest Ref Range: 5-'15   11    9   10  8  10  ' Phosphorus Latest Ref Range: 2.5-4.6 mg/dL             4.4  Alkaline Phosphatase Latest Ref Range: 38-126 U/L  67           70  Albumin Latest Ref Range: 3.5-5.0 g/dL  2.7 (L)           2.3 (L)  AST Latest Ref Range: 15-41 U/L  49 (H)           27  ALT Latest Ref Range: 17-63 U/L  28           25  Total Protein Latest Ref Range: 6.5-8.1 g/dL  7.4           7.7  Ammonia Latest Ref Range: 9-35 umol/L             37 (H)  Total Bilirubin Latest Ref Range: 0.3-1.2 mg/dL  0.8           1.0  Iron Latest Ref Range: 45-182 ug/dL         15 (L)      UIBC Latest Units: ug/dL         168      TIBC Latest Ref Range: 250-450 ug/dL  183 (L)      Saturation Ratios Latest Ref Range: 17.9-39.5 %         8 (L)      Ferritin Latest Ref Range: 24-336 ng/mL         604 (H)      Folate Latest Ref Range: >5.9 ng/mL         7.4      Vitamin B-12 Latest Ref Range: 180-914 pg/mL         1827 (H)      WBC Latest Ref Range: 4.0-10.5 K/uL  12.3 (H)    11.8 (H)   13.1 (H)    20.7 (H)  RBC Latest Ref Range: 4.22-5.81 MIL/uL  3.29 (L)    2.97 (L)   3.07 (L)    3.00 (L)    Hemoglobin Latest Ref Range: 13.0-17.0 g/dL  8.6 (L)    7.7 (L)   7.9 (L)    7.8 (L)  HCT Latest Ref Range: 39.0-52.0 %  26.3 (L)    23.8 (L)   24.5 (L)    23.8 (L)  MCV Latest Ref Range: 78.0-100.0 fL  79.9    80.1   79.8    79.3  MCH Latest Ref Range: 26.0-34.0 pg  26.1    25.9 (L)   25.7 (L)    26.0  MCHC Latest Ref Range: 30.0-36.0 g/dL  32.7    32.4   32.2    32.8  RDW Latest Ref Range: 11.5-15.5 %  16.6 (H)    16.8 (H)   16.9 (H)    16.7 (H)  Platelets Latest Ref Range: 150-400 K/uL  267    257   261    292  Neutrophils Latest Units: %  76           80  Lymphocytes Latest Units: %  13           9  Monocytes Relative Latest Units: %  9           10  Eosinophil Latest Units: %  2           0  Basophil Latest Units: %  0           0  NEUT# Latest Ref Range: 1.7-7.7 K/uL  9.4 (H)           16.6 (H)  Lymphocyte # Latest Ref Range: 0.7-4.0 K/uL  1.6           1.9  Monocyte # Latest Ref Range: 0.1-1.0 K/uL  1.1 (H)           2.2 (H)  Eosinophils Absolute Latest Ref Range: 0.0-0.7 K/uL  0.2           0.0  Basophils Absolute Latest Ref Range: 0.0-0.1 K/uL  0.0           0.0  RBC. Latest Ref Range: 4.22-5.81 MIL/uL         3.07 (L)      Retic Ct Pct Latest Ref Range: 0.4-3.1 %         2.5      Retic Count, Manual Latest Ref Range: 19.0-186.0 K/uL         76.8      PTH Latest Ref Range: 15-65 pg/mL             153 (H)  CCP Antibodies IgG/IgA Latest Ref Range: 0-19 units         6  Appearance Latest Ref Range: CLEAR    CLOUDY (A)            Bacteria, UA Latest Ref Range: NONE SEEN    RARE (A)            Bilirubin Urine Latest Ref Range: NEGATIVE    NEGATIVE            Color, Urine Latest Ref Range: YELLOW    YELLOW            Glucose Latest Ref Range: NEGATIVE mg/dL   NEGATIVE            Hgb urine dipstick Latest Ref Range: NEGATIVE    MODERATE (A)            Ketones, ur Latest Ref Range: NEGATIVE mg/dL   NEGATIVE            Leukocytes, UA Latest Ref Range: NEGATIVE    NEGATIVE             Nitrite Latest Ref Range: NEGATIVE    NEGATIVE            pH Latest Ref Range: 5.0-8.0    5.0            Protein Latest Ref Range: NEGATIVE mg/dL   30 (A)            RBC / HPF Latest Ref Range: 0-5 RBC/hpf   0-5            Specific Gravity, Urine Latest Ref Range: 1.005-1.030    1.014            Squamous Epithelial / LPF Latest Ref Range: NONE SEEN    0-5 (A)            Urine-Other Unknown   MUCOUS PRESENT            WBC, UA Latest Ref Range: 0-5 WBC/hpf   0-5            DG ANKLE 2 VIEWS RIGHT Unknown        Rpt       DG CHEST 2 VIEW Unknown          Rpt     DG KNEE 1-2 VIEWS RIGHT Unknown       Rpt        DG LUMBAR SPINE 2-3 VIEWS Unknown Rpt              DG THORACIC SPINE 2 VIEW Unknown Rpt              MR LUMBAR SPINE WO CONTRAST Unknown    Rpt           MR THORACIC SPINE WO CONTRAST Unknown     Rpt          ECHO COMPLETE Unknown            Rpt                                        *[place]*                   *[facility]*                         [address]  [place],  [address]                            [phone]  ------------------------------------------------------------------- Transthoracic Echocardiography  (Report amended )  Patient:    Lyal, Husted MR #:       [id number] Study Date: 08/13/2015 Gender:     M Age:        23 Height:     180.3 cm Weight:     179.2 kg BSA:        3.1 m^2 Pt. Status: Room:       [place], [place], [place] [id number]  Rowe Pavy [id number]  SONOGRAPHER  Leavy Cella  PERFORMING   [facility], Inpatient  ATTENDING    [facility]  cc:  ------------------------------------------------------------------- LV EF: 30% -   35%  ------------------------------------------------------------------- Indications:      CHF - 428.0.  ------------------------------------------------------------------- History:   PMH:   Former Smoker.  Congestive heart failure.  Risk factors:  Hypertension.  ------------------------------------------------------------------- Study Conclusions  - Left ventricle: The cavity size was severely dilated. Wall   thickness was increased in a pattern of mild LVH. Systolic   function was moderately to severely reduced. The estimated   ejection fraction was in the range of 30% to 35%. Doppler   parameters are consistent with abnormal left ventricular   relaxation (grade 1 diastolic dysfunction). - Impressions: Extremely limited; definity used but LV function   still difficult to quantitate; moderate to severe global   reduction in LV function; grade 1 diastolic dysfunction; severe   LVE; mild LVH. Suggest cardiac MRI or MUGA to better assess LV   function.  Impressions:  - Extremely limited; definity used but LV function still difficult   to quantitate; moderate to severe global reduction in LV   function; grade 1 diastolic dysfunction; severe LVE; mild LVH.   Suggest cardiac MRI or MUGA to better assess LV function.  Transthoracic echocardiography.  M-mode, complete 2D, spectral Doppler, and color Doppler.  Birthdate:  Patient birthdate: 04-Jan-1961.  Age:  Patient is 54 yr old.  Sex:  Gender: male. BMI: 55.1 kg/m^2.  Blood pressure:     111/52  Patient status: Inpatient.  Study date:  Study date: 08/13/2015. Study time: 09:43 AM.  Location:  Bedside.  -------------------------------------------------------------------  ------------------------------------------------------------------- Left ventricle:  The cavity size was severely dilated. Wall thickness was increased in a pattern of mild LVH. Systolic function was moderately to severely reduced. The estimated ejection fraction was in the range of 30% to 35%. Images were inadequate for LV wall motion assessment. Doppler parameters are consistent with abnormal left ventricular relaxation (grade 1 diastolic  dysfunction).  ------------------------------------------------------------------- Aortic valve:   Trileaflet; normal thickness leaflets. Mobility was not restricted.  Doppler:  Transvalvular velocity was within the normal range. There was no stenosis. There was no regurgitation.   ------------------------------------------------------------------- Aorta:  Aortic root: The aortic root was normal in size.  ------------------------------------------------------------------- Mitral valve:   Structurally normal valve.   Mobility was not restricted.  Doppler:  Transvalvular velocity was within the normal range. There was no evidence for stenosis. There was no regurgitation.  ------------------------------------------------------------------- Left atrium:  The atrium was normal in size.  ------------------------------------------------------------------- Right ventricle:  Poorly visualized. The cavity size was normal.   ------------------------------------------------------------------- Pulmonic valve:    Doppler:  Transvalvular velocity was within the normal range. There  was no evidence for stenosis.  ------------------------------------------------------------------- Tricuspid valve:  Poorly visualized.  Doppler:  Transvalvular velocity was within the normal range. There was no regurgitation.   ------------------------------------------------------------------- Right atrium:  Poorly visualized. The atrium was normal in size.   ------------------------------------------------------------------- Pericardium:  There was no pericardial effusion.  ------------------------------------------------------------------- Systemic veins: Inferior vena cava: The vessel was normal in size.            Study Result       CLINICAL DATA:  Dyspnea.  History of CHF and hypertension   EXAM: CT CHEST WITHOUT CONTRAST   TECHNIQUE: Multidetector CT imaging of the chest was performed following  the standard protocol without IV contrast.   COMPARISON:  None.   FINDINGS: The central airways are patent. There is no focal consolidation. There is mild bibasilar atelectasis. There is no pleural effusion or pneumothorax.   There are no pathologically enlarged axillary, hilar or mediastinal lymph nodes.   The heart size is normal. There is no pericardial effusion. The thoracic aorta is normal in caliber. Left IJ central venous catheter with the tip projecting over the left brachycephalic vein.   Review of bone windows demonstrates no focal lytic or sclerotic lesions. There is a chronic T11 vertebral body compression fracture.   Limited non-contrast images of the upper abdomen were obtained. The adrenal glands appear normal. The remainder of the visualized abdominal organs are unremarkable.   IMPRESSION: No acute cardiopulmonary process.     Electronically Signed   By: Kathreen Devoid   On: 08/15/2015 19:31         Study Result       CLINICAL DATA:  Altered mental status following fall earlier today   EXAM: CT HEAD WITHOUT CONTRAST   TECHNIQUE: Contiguous axial images were obtained from the base of the skull through the vertex without intravenous contrast.   COMPARISON:  None.   FINDINGS: The ventricles are normal in size and configuration. There is no intracranial mass, hemorrhage, extra-axial fluid collection, or midline shift. Gray-white compartments appear normal. No acute infarct evident. Bony calvarium appears intact. The mastoid air cells are clear. No intraorbital lesions are identified.   IMPRESSION: Study within normal limits.     Electronically Signed   By: Lowella Grip III M.D.   On: 08/14/2015 15:52   right hip pain. Incontinence. Status post fall.   EXAM: MRI THORACIC SPINE WITHOUT CONTRAST   TECHNIQUE: Multiplanar, multisequence MR imaging of the thoracic spine was performed. No intravenous contrast was administered.    COMPARISON:  Prior radiograph from earlier the same day.   FINDINGS: Vertebral bodies are normally aligned with preservation of the normal thoracic kyphosis. There is mild chronic height loss at the superior endplate of T46, chronic in nature. No associated retropulsion. Small Schmorl's nodes at the superior endplates of T2 and F68. Minimal edema about the Schmorl's node at T12. Otherwise, vertebral body heights are well maintained. No fracture.   Mild diffuse decreased it T1 signal intensity throughout the vertebral body bone marrow noted, likely within normal limits. No focal osseous lesions. No marrow edema.   Signal intensity within the thoracic spinal cord is within normal limits.   No paraspinous soft tissue abnormality.   Diffusely flowing osteophytes seen throughout the mid and lower thoracic spine, suggesting DISH. No significant disc bulge within the thoracic spine. No significant canal or foraminal stenosis.   IMPRESSION: 1. No acute abnormality within the thoracic spine. No cord compression or significant stenosis. 2. Mild chronic height  loss at the superior endplate of V20. 3. Diffusely flowing bridging osteophytes throughout the mid and lower thoracic spine, suggesting DISH.     Electronically Signed   By: Jeannine Boga M.D.   On: 08/10/2015 22:54     Study Result       CLINICAL DATA:  Initial evaluation for acute low back pain with pain and right hip status post fall, weakness.   EXAM: MRI LUMBAR SPINE WITHOUT CONTRAST   TECHNIQUE: Multiplanar, multisequence MR imaging of the lumbar spine was performed. No intravenous contrast was administered.   COMPARISON:  Prior radiograph from earlier the same day.   FINDINGS: Study is limited by body habitus.   The purposes of this dictation, the lowest well-formed intervertebral disc spaces presumed to be the L5-S1 level, and there presumed to be 5 lumbar type vertebral bodies.   Vertebral bodies  are normally aligned with preservation of the normal lumbar lordosis. Vertebral body heights are maintained. No fracture.   Somewhat diffusely decreased T1 signal intensity within the visualized vertebral body bone marrow, but still likely within normal limits.   Conus medullaris terminates normally at the T12-L1 level. Signal intensity within the visualized cord is normal. Nerve roots of the cauda equina are normal in appearance.   Paraspinous soft tissues demonstrate no acute abnormality.   Diffuse congenital shortening of the pedicles present.   No significant degenerative pathology present at T11-12 and T12-L1.   L1-2: Degenerative disc desiccation with minimal disc bulge. Prominent osteophytic spurring along the right anterolateral aspect of the L1-2 disc space. No focal disc herniation. No significant canal stenosis. Mild edema about the right L1-2 facet likely related to degenerative changes. Very mild right foraminal. No neural impingement.   L2-3:  Negative.   L3-4: No disc bulge or disc protrusion. Intervertebral disc is well hydrated. Bilateral facet arthrosis, right greater than left. No significant canal or foraminal stenosis. No neural impingement.   L4-5: Diffuse disc bulge with disc desiccation. There is a small shallow superimposed central disc protrusion which indents the ventral thecal sac. Mild bilateral facet arthrosis. These changes superimposed on short pedicles results in moderate canal stenosis. Mild left foraminal narrowing present.   L5-S1: No disc bulge or disc protrusion. Very mild bilateral facet hypertrophy. No significant canal or foraminal stenosis.   IMPRESSION: 1. No acute abnormality within the lumbar spine. 2. Shallow central disc protrusion at L4-5, which superimposed on mild facet arthrosis and short pedicles results in moderate canal stenosis. No other significant canal narrowing within the lumbar spine. No cord compression. 3.  Severe right-sided facet arthrosis at L1-2 with associated reactive edema. 4. Mild degenerative disc bulge at L1-2 without stenosis. 5. Diffuse congenital shortening of the pedicles.     Electronically Signed   By: Jeannine Boga M.D.   On: 08/10/2015 22:15      Impression/plan; #1 acute on chronic renal failure; the exact severity of the chronic part of this is really unclear. The patient doesn't tell me he knows anything about this. I will set up a follow-up with Dr. Florene Glen repeat his basic metabolic panel. One would have to wonder in this man with long-standing hypertension including an admission to hospital with accelerated hypertension whether he has secondary hypertension renal vascular disease etc. #2 anemia felt to be secondary to a combination of chronic renal failure and iron deficiency. He has been given IV iron and started on iron this will need to be followed up. #3 cardiomyopathy exact nature uncertain although ischemia  needs to be ruled out. #4 migratory polyarthritis with a marginal rheumatoid factor but a high sedimentation rate. His presentation would make poly-myositis a rule out and I will order a CK andaldolase level #5 morbid obesity with likely obesity hypoventilation syndrome. He has a BiPap. #6 delirium in hospital this appears to have resolved #7 according to the patient was a bit of a vague historian he has had long-standing hypertension perhaps dating back to when he was young man  The patient's pain seems to be better on the steroids. He is already to get up and mobilize. I found his reflexes to be surprisingly brisk but not pathologic. However the right Hoffman's reflex is definitely an upper motor neuron pathologic finding. I could not find anything else on basic exam to be certain of the diagnosis here. The cause of numbness in his right foot is unclear however and he may need to see a neurologist and his long list of consulting Drs.

## 2015-08-28 ENCOUNTER — Ambulatory Visit (INDEPENDENT_AMBULATORY_CARE_PROVIDER_SITE_OTHER): Payer: Medicare Other | Admitting: Cardiology

## 2015-08-28 ENCOUNTER — Encounter: Payer: Self-pay | Admitting: Cardiology

## 2015-08-28 VITALS — BP 130/80 | HR 88 | Ht 71.0 in | Wt 379.0 lb

## 2015-08-28 DIAGNOSIS — I5022 Chronic systolic (congestive) heart failure: Secondary | ICD-10-CM

## 2015-08-28 DIAGNOSIS — I10 Essential (primary) hypertension: Secondary | ICD-10-CM

## 2015-08-28 DIAGNOSIS — I428 Other cardiomyopathies: Secondary | ICD-10-CM

## 2015-08-28 DIAGNOSIS — E662 Morbid (severe) obesity with alveolar hypoventilation: Secondary | ICD-10-CM | POA: Diagnosis not present

## 2015-08-28 DIAGNOSIS — I429 Cardiomyopathy, unspecified: Secondary | ICD-10-CM

## 2015-08-28 NOTE — Patient Instructions (Signed)
You have an appointment scheduled with your cardiologist, Dr. Donnetta Hutching, on September 19, 2015 at 9:15 AM at the [facility] on [address]. If you are unable to make this appointment, please call [phone] to reschedule.

## 2015-08-28 NOTE — Progress Notes (Signed)
Cardiology Office Note   Date:  08/29/2015   ID:  Noah Garner, DOB August 08, 1961, MRN [id number]  PCP:  Nilda Simmer, MD    Chief Complaint  Patient presents with  . Congestive Heart Failure  . Hypertension      History of Present Illness: Noah Garner is a 54 y.o. male who presents for hospital followup.  In review of Care everywhere he is followed by Dr. Donnetta Hutching from [facility] in [facility] although the patient states he has never been seen by a Cardiologist and has no doctors.  The last OV with cardiology was in June and in talking with their office they said that he is overdue for followup.  He has a history of DCM with chronic CHF, HTN, morbid obesity, and chronic renal insufficiency. According to Dr. Deatra Robinson last [place] note that patient is noncompliant with following up with his PCP and nephrology as recommended.  He was hospitalized at [facility] forinability to ambulate, low back pain and B/ LE edema with ulcer.  He was diagnosed with acute respiratory failure, acute kidney injury and acute on chronic systolic CHF.  He was diuresed.  Echo showed EF 30-35% with severe LVE.  He required BiPAP for a while due to worsening respiratory status.  He was started on Hydralazine and Imdur as well as coreg.  No ACEI or ARB was started due to CKD.  Weight on day of d/c was 382 lbs.  It was recommended at the time of discharge that he followup with nephrology and with his Cardiologist Dr. Donnetta Hutching.  He has not followed up with nephrology.  He is currently in Rehab/SNF and Dr. Dellia Nims referred him for further cardiology eval.  He has chronic LE edema that has not improved.  He denies any chest pain.  He has chronic SOB that has improved since hospitalization.  He is a very poor historian.      Past Medical History  Diagnosis Date  . CHF (congestive heart failure) ([place])   . Hypertension   . Arthritis   . Chronic systolic heart failure ([place]) 08/29/2015    No past surgical history on file.   Current  Outpatient Prescriptions  Medication Sig Dispense Refill  . carvedilol (COREG) 12.5 MG tablet Take 1 tablet (12.5 mg total) by mouth 2 (two) times daily with a meal. 60 tablet 0  . ferrous sulfate 325 (65 FE) MG EC tablet Take 1 tablet (325 mg total) by mouth daily with breakfast. 30 tablet 0  . furosemide (LASIX) 40 MG tablet Take 60 mg by mouth 2 (two) times daily.    . hydrALAZINE (APRESOLINE) 25 MG tablet Take 1 tablet (25 mg total) by mouth every 8 (eight) hours. 90 tablet 0  . isosorbide mononitrate (IMDUR) 30 MG 24 hr tablet Take 1 tablet (30 mg total) by mouth daily. 30 tablet 0  . predniSONE (DELTASONE) 20 MG tablet Take 3 tablets (60 mg total) by mouth daily with breakfast. 30 tablet 0   No current facility-administered medications for this visit.    Allergies:   Review of patient's allergies indicates no known allergies.    Social History:  The patient  reports that he quit smoking about 20 years ago. He does not have any smokeless tobacco history on file. He reports that he does not drink alcohol or use illicit drugs.   Family History:  The patient's family history includes Hypertension in his mother.  ROS:  Please see the history of present illness.   Otherwise, review of systems are positive for none.   All other systems are reviewed and negative.    PHYSICAL EXAM: VS:  BP 130/80 mmHg  Pulse 88  Ht 5\' 11"  (1.803 m)  Wt 379 lb (171.913 kg)  BMI 52.88 kg/m2 , BMI Body mass index is 52.88 kg/(m^2). GEN: Well nourished, well developed, in no acute distressmorbidly obese HEENT: normal Neck: no JVD, carotid bruits, or masses Cardiac: RRR; no murmurs, rubs, or gallops.  2-3+ edema bilaterally Respiratory:  clear to auscultation bilaterally, normal work of breathing GI: soft, nontender, nondistended, + BS MS: no deformity or atrophy Skin: warm and dry, no rash Neuro:  Strength and sensation are intact Psych: euthymic mood, full affect   EKG:  EKG is not ordered  today.    Recent Labs: 08/14/2015: ALT 25 08/20/2015: BUN 85*; Creatinine, Ser 3.28*; Hemoglobin 7.7*; Magnesium 2.1; Platelets 288; Potassium 4.6; Sodium 143    Lipid Panel No results found for: CHOL, TRIG, HDL, CHOLHDL, VLDL, LDLCALC, LDLDIRECT    Wt Readings from Last 3 Encounters:  08/28/15 379 lb (171.913 kg)  08/17/15 384 lb (174.181 kg)       ASSESSMENT AND PLAN:  1.  Chronic systolic CHF - 2D echo was limited by poor acoustical windows but EF estimated at 30-35%.  Etiology of DCM unknown.  Continue BB/Imdur/Hydralazine.  No ARB/ACE I or spironolactone due to CKD.  I suspect a lot of his volume issues are due to worsening renal function. His weight is stable (382lbs on d/c and now 379lbs today).  Lungs are clear.  He has chronic LE edema which I suspect is multifactorial from sedentary state, morbid obesity and CKD as well as chronic CHF.  He appears at his dry weight.   Will defer workup of DCM to his primary Cardiologist Dr. Donnetta Hutching.  We have contacted his office and they have him scheduled for an appt on 09/19/2015.   2.  CKD - needs to followup with nephrology - last creatinine was 3.28 3.  HTN - controlled on current meds.   4.  Morbid obesity with OSA on CPAP   Current medicines are reviewed at length with the patient today.  The patient does not have concerns regarding medicines.  The following changes have been made:  no change  Labs/ tests ordered today: See above Assessment and Plan No orders of the defined types were placed in this encounter.     Disposition:   FU with me PRN and will followup as above with his regular Cardiologist Dr. Donnetta Hutching  Signed, Sueanne Margarita, MD  08/29/2015 10:03 PM    [address], [place], [place]  [address] Phone: [phone]; Fax: [phone]

## 2015-08-29 ENCOUNTER — Encounter: Payer: Self-pay | Admitting: Cardiology

## 2015-08-29 DIAGNOSIS — I5042 Chronic combined systolic (congestive) and diastolic (congestive) heart failure: Secondary | ICD-10-CM | POA: Insufficient documentation

## 2015-08-29 DIAGNOSIS — I5022 Chronic systolic (congestive) heart failure: Secondary | ICD-10-CM

## 2015-08-29 HISTORY — DX: Chronic systolic (congestive) heart failure: I50.22

## 2015-09-03 ENCOUNTER — Non-Acute Institutional Stay (SKILLED_NURSING_FACILITY): Payer: Medicare Other | Admitting: Internal Medicine

## 2015-09-03 DIAGNOSIS — N189 Chronic kidney disease, unspecified: Secondary | ICD-10-CM | POA: Diagnosis not present

## 2015-09-03 DIAGNOSIS — E662 Morbid (severe) obesity with alveolar hypoventilation: Secondary | ICD-10-CM

## 2015-09-03 DIAGNOSIS — I83229 Varicose veins of left lower extremity with both ulcer of unspecified site and inflammation: Secondary | ICD-10-CM | POA: Diagnosis not present

## 2015-09-03 DIAGNOSIS — D631 Anemia in chronic kidney disease: Secondary | ICD-10-CM | POA: Diagnosis not present

## 2015-09-03 DIAGNOSIS — L97929 Non-pressure chronic ulcer of unspecified part of left lower leg with unspecified severity: Secondary | ICD-10-CM

## 2015-09-03 DIAGNOSIS — N179 Acute kidney failure, unspecified: Secondary | ICD-10-CM

## 2015-09-05 DIAGNOSIS — N184 Chronic kidney disease, stage 4 (severe): Secondary | ICD-10-CM | POA: Diagnosis not present

## 2015-09-05 DIAGNOSIS — I428 Other cardiomyopathies: Secondary | ICD-10-CM | POA: Diagnosis not present

## 2015-09-05 DIAGNOSIS — E669 Obesity, unspecified: Secondary | ICD-10-CM | POA: Diagnosis not present

## 2015-09-05 DIAGNOSIS — I129 Hypertensive chronic kidney disease with stage 1 through stage 4 chronic kidney disease, or unspecified chronic kidney disease: Secondary | ICD-10-CM | POA: Diagnosis not present

## 2015-09-10 NOTE — Progress Notes (Addendum)
Patient ID: Noah Garner, male   DOB: 1961/09/09, 54 y.o.   MRN: [id number]                PROGRESS NOTE  DATE:  09/03/2015           FACILITY: [facility]                 LEVEL OF CARE:   SNF   Acute Visit               CHIEF COMPLAINT:  Follow up medical issues from last week.      HISTORY OF PRESENT ILLNESS:  This is a gentleman whom I admitted to the facility last week.  He had been in [facility] from 08/10/2015 through 08/20/2015 with increasing migratory pain in his knees, hands and shoulders, making it impossible for him to walk.    On admission to hospital, he was noted to have acute on chronic renal failure although his baseline creatinine is really unclear.  He was diuresed but then developed hypercapnic, hypoxic respiratory failure, requiring BiPAP in the ICU.  He was not intubated.     He has a known cardiomyopathy with an EF of 30-35%.  He has seen Dr. Radford Pax, who feels that this is non-ischemic cardiomyopathy.    He also has a history of accelerated hypertension.    Finally, he has chronic severe bilateral lower extremity venous insufficiency with lymphedema.  He had a small open area on his left anterior lateral leg which is closing.  He has an Unna wrap on the left leg.  He appears to be doing well here, but he is complaining about edema now in the right leg.    As mentioned, the patient saw Dr. Fransico Him of Cardiology who verified his 2D-echo with an estimated EF of 30-35%.  He is on a beta blocker, Imdur, hydralazine.  He is not on ARBs, ACEs, or aldactone secondary to chronic kidney disease.  The issue was felt to be mostly worsening secondary to his renal insufficiency.    His weight today is 382 pounds.  This has not changed much in the almost two weeks that he has been here.  He came in at 384 pounds.    CURRENT MEDICATIONS:  Medication list is reviewed.    Carvedilol 12.5 b.i.d.     Ferrous sulfate 325 daily.    Lasix 60 b.i.d.     Hydralazine 25  q.d.        Imdur 30 q.d.       Prednisone.  Currently, I have initiated a taper.  He is on 50 mg, tapering by 10 mg every week.     LABORATORY DATA:   He left the hospital with a creatinine of 3.26 and a hemoglobin of 7.7.    Lab work from 08/30/2015:    Sodium 141, potassium 5.2, CO2 of 31.7, BUN 56, creatinine 3.45.  Estimated GFR 23.94.    Hemoglobin up to 9.1, white count 11.5, RDW 19.8.  Aldolase level normal at 2.8.    CK also normal at 34.    REVIEW OF SYSTEMS:    GENERAL:  The patient states he feels better.  He is up walking with a walker.   CHEST/RESPIRATORY:  No shortness of breath.  No cough.   CARDIAC:  No chest pain.   GI:  No abdominal pain.   No nausea, vomiting, or diarrhea.       GU:  No dysuria.  EDEMA/VARICOSITIES:  Extremities:  He is complaining about edema in the right leg, which we have not been wrapping.    PHYSICAL EXAMINATION:   VITAL SIGNS:     TEMPERATURE:  98.6.   PULSE:  80.   RESPIRATIONS:  18.     BLOOD PRESSURE:  136/80.   02 SATURATIONS:  98%.   BLOOD SUGAR:  There is a single blood sugar listed at 241, although the glucose on his lab work was 90.      WEIGHT:  382 pounds.   CHEST/RESPIRATORY:  Shallow, but otherwise clear air entry.    CARDIOVASCULAR:   CARDIAC:  Heart sounds are normal.  JVP is not elevated.  There is no S3.      GASTROINTESTINAL:   ABDOMEN:  Morbid obesity.  No masses.    LIVER/SPLEEN/KIDNEYS:  No liver, no spleen.  No tenderness.     CIRCULATION:   EDEMA/VARICOSITIES:  Extremities:  He has severe bilateral venous insufficiency.   SKIN:   INSPECTION:  His wound on the left anterior lateral leg is almost fully epithelialized.   VASCULAR:   ARTERIAL:  His peripheral pulses are palpable.    ASSESSMENT/PLAN:              Acute on chronic renal failure.   I will repeat his creatinine next week.  He is supposed to follow up with Nephrology.  His lab work done last week here was stable stage IV chronic renal  insufficiency, probably secondary to nephrosclerosis.     Non-ischemic cardiomyopathy, per Cardiology.  This is stable.  His weights are stable.    Obesity hypoventilation/obstructive sleep apnea.  Dr. Theodosia Blender notes state that he is on CPAP.  Discharge instructions from the hospital suggested BiPAP.     Migratory inflammatory arthritis.  He did not have elevation of muscle enzymes.  He is on a taper of prednisone. I have asked for a rheumatology consult. The arthrits was severe enough to make ambulation difficult  ?Steroid-induced diabetes.  I will need to put him on some form of monitoring here.    Venous insufficiency with an ulcer on the left leg.  He is going to need bilateral compression stockings.  He tells me he has had these in the past, but not recently.

## 2015-09-16 ENCOUNTER — Non-Acute Institutional Stay (SKILLED_NURSING_FACILITY): Payer: Medicare Other | Admitting: Internal Medicine

## 2015-09-16 DIAGNOSIS — E662 Morbid (severe) obesity with alveolar hypoventilation: Secondary | ICD-10-CM

## 2015-09-16 DIAGNOSIS — M1990 Unspecified osteoarthritis, unspecified site: Secondary | ICD-10-CM | POA: Diagnosis not present

## 2015-09-16 DIAGNOSIS — R739 Hyperglycemia, unspecified: Secondary | ICD-10-CM | POA: Diagnosis not present

## 2015-09-16 DIAGNOSIS — M05761 Rheumatoid arthritis with rheumatoid factor of right knee without organ or systems involvement: Secondary | ICD-10-CM

## 2015-09-16 DIAGNOSIS — I420 Dilated cardiomyopathy: Secondary | ICD-10-CM

## 2015-09-16 DIAGNOSIS — R609 Edema, unspecified: Secondary | ICD-10-CM

## 2015-09-16 DIAGNOSIS — D649 Anemia, unspecified: Secondary | ICD-10-CM | POA: Diagnosis not present

## 2015-09-16 DIAGNOSIS — N184 Chronic kidney disease, stage 4 (severe): Secondary | ICD-10-CM | POA: Diagnosis not present

## 2015-09-16 DIAGNOSIS — M05762 Rheumatoid arthritis with rheumatoid factor of left knee without organ or systems involvement: Secondary | ICD-10-CM | POA: Diagnosis not present

## 2015-09-16 DIAGNOSIS — I83029 Varicose veins of left lower extremity with ulcer of unspecified site: Secondary | ICD-10-CM

## 2015-09-16 DIAGNOSIS — R2681 Unsteadiness on feet: Secondary | ICD-10-CM | POA: Diagnosis not present

## 2015-09-16 NOTE — Progress Notes (Signed)
Patient ID: Noah Garner, male   DOB: [birth year], 55 y.o.   MRN: [id number]    [place] Room Number: 416  Place of Service: SNF (31)     No Known Allergies  Chief Complaint  Patient presents with  . Discharge Note    HPI:  This patient was admitted from the hospital on 08/20/2015. I was asked to see him for possible discharge today, but he was in respiratory failure at the hospital and was placed on BiPAP. We have been unable to successfully get the home respiratory services to commit to giving him appropriate devices to administer BiPAP at home. They believe that he will need another sleep study before this can be improved. Patient was in respiratory failure in the hospital with hypercarbia. He ultimately seemed to improve on the BiPAP. He has been maintained on this while at the skilled nursing facility. No attempts have been made while at the skilled nursing facility to wean him.  Respiratory issues date back at least 4 years ago. He was having problems of daytime headaches, excessive snoring at night, and fatigue. A sleep study was performed in [place] which confirmed sleep apnea. Patient is not sure whether recommendations were made for either CPAP or BiPAP. He never obtained the proper equipment and did not follow up on these abnormal lab tests.   Testing was done at  [facility] on [address], [place] [address]. Telephone [phone].  Discharge was deferred today because of the inability to get him the respiratory equipment that he needs to safely transfer to home. It is my fear, and sending him out with out BiPAP or CPAP will result in a quick return to the hospital. Sleep study will be scheduled as soon as possible.  Patient was also in congestive heart failure with both chronic and acute components of systolic and diastolic heart failure. Previous 2-D echocardiogram done on 08/13/2015 by Dr. Golden Hurter showed an  ejection fraction of 30-35%. She felt that he had a nonischemic cardiomyopathy. This also improved while hospitalized. Patient has lost nearly 30 pounds since his admission to the hospital. He feels that he is breathing easier and has become more mobile than he was at the time of admission to the skilled nursing facility.  Patient had acute renal failure. BUN and creatinine have not been repeated while he was in the nursing facility, but this will be ordered today, since it appears that he will need to stay a few more days until the sleep study is completed.  Hypertension has remained under control.  Patient has severe bilateral lower extremity venous insufficiency with lymphedema. There was a small open wound on the left anterior lateral leg which was healing. He has Unna boots applied bilaterally. They're being used mainly to control edema at this point.  Patient has an anemia and has been on iron. CBC is to be repeated.  Patient has a history of inflammatory arthritis. Rheumatoid factor is mildly elevated at 19. Sedimentation rate was 140. He has been treated with prednisone and tapered.  Patient is currently living with his cousin. Patient says he is committed to starting a walking program. He acknowledges that he is "lazy" in regards to his diet and exercise. He frequently snacks at home. He has had no regular walking or other exercise program.  Patient has some additional social concerns in regards to his daughter. He is extremely worried about her. She has been having  blackout spells that seems to have affected her memory. He says that she does not recognize him most of the time. She has been assessed locally as well as in [facility].  Medications: Patient's Medications  New Prescriptions   No medications on file  Previous Medications   CARVEDILOL (COREG) 12.5 MG TABLET    Take 1 tablet (12.5 mg total) by mouth 2 (two) times daily with a meal.   FERROUS SULFATE 325 (65 FE) MG EC TABLET     Take 1 tablet (325 mg total) by mouth daily with breakfast.   FUROSEMIDE (LASIX) 40 MG TABLET    Take 60 mg by mouth 2 (two) times daily.   HYDRALAZINE (APRESOLINE) 25 MG TABLET    Take 1 tablet (25 mg total) by mouth every 8 (eight) hours.   ISOSORBIDE MONONITRATE (IMDUR) 30 MG 24 HR TABLET    Take 1 tablet (30 mg total) by mouth daily.   PREDNISONE (DELTASONE) 20 MG TABLET    Take 3 tablets (60 mg total) by mouth daily with breakfast.  Modified Medications   No medications on file  Discontinued Medications   No medications on file     Review of Systems  Constitutional: Positive for fatigue. Negative for fever, activity change, appetite change and unexpected weight change.       Morbidly obese. Has been on disability since he was age 56 due to congestive heart failure.  HENT: Negative for congestion, ear pain, hearing loss, rhinorrhea, sore throat, tinnitus, trouble swallowing and voice change.   Eyes: Positive for visual disturbance (Corrective lenses).       Corrective lenses  Respiratory: Positive for shortness of breath. Negative for cough, choking, chest tightness and wheezing.        The pattern on BiPAP at bedtime  Cardiovascular: Positive for leg swelling (bilateral lower leg). Negative for chest pain and palpitations.       History of systolic and diastolic chronic heart failure with ejection fraction of 30-35%. History of nonischemic dilated cardiomyopathy.  Gastrointestinal: Negative for nausea, abdominal pain, diarrhea, constipation and abdominal distention.  Endocrine: Negative for cold intolerance, heat intolerance, polydipsia, polyphagia and polyuria.  Genitourinary: Negative for dysuria, urgency, frequency and testicular pain.       Not incontinent. History chronic renal insufficiency, stage IV. Nephrologist as proposed getting him a shunt.  Musculoskeletal: Positive for back pain, arthralgias and gait problem (Using four-wheel walker). Negative for myalgias and neck  pain.       History of migratory arthralgias and slightly positive rheumatoid factor.  Skin: Negative for color change, pallor and rash.       Chronic lesion anterior left shin.  Allergic/Immunologic: Negative.   Neurological: Negative for dizziness, tremors, syncope, speech difficulty, weakness, numbness and headaches.  Hematological: Negative for adenopathy. Does not bruise/bleed easily.       History of anemia.  Psychiatric/Behavioral: Positive for sleep disturbance (History sleep apnea). Negative for hallucinations, behavioral problems, confusion and decreased concentration. The patient is not nervous/anxious.     Filed Vitals:   09/16/15 1415  BP: 130/80  Pulse: 72  Temp: 98 F (36.7 C)  Resp: 14  Height: _0  (1.803 m)  Weight: 377 lb (171.006 kg)   Wt Readings from Last 3 Encounters:  09/16/15 377 lb (171.006 kg)  08/28/15 379 lb (171.913 kg)  08/17/15 384 lb (174.181 kg)    Body mass index is 52.6 kg/(m^2).  Physical Exam  Constitutional: He is oriented to person, place, and time.  He appears well-developed and well-nourished. No distress.  Morbidly obese.  HENT:  Right Ear: External ear normal.  Left Ear: External ear normal.  Nose: Nose normal.  Mouth/Throat: Oropharynx is clear and moist. No oropharyngeal exudate.  Eyes: Conjunctivae and EOM are normal. Pupils are equal, round, and reactive to light.  Decreased vision bilaterally  Neck: No JVD present. No tracheal deviation present. No thyromegaly present.  Cardiovascular: Normal rate, regular rhythm, normal heart sounds and intact distal pulses.  Exam reveals no gallop and no friction rub.   No murmur heard. Pulmonary/Chest: No respiratory distress. He has no wheezes. He has no rales. He exhibits no tenderness.  Abdominal: He exhibits no distension and no mass. There is no tenderness.  Musculoskeletal: Normal range of motion. He exhibits no edema or tenderness.  Lymphadenopathy:    He has no cervical  adenopathy.  Neurological: He is alert and oriented to person, place, and time. He has normal reflexes. No cranial nerve deficit. Coordination normal.  Skin: No rash noted. No erythema. No pallor.  Chronic leg swelling and a healing small lesion in the left anterior shin.  Psychiatric: He has a normal mood and affect. His behavior is normal. Judgment and thought content normal.     Labs reviewed: Lab Summary Latest Ref Rng 08/20/2015 08/19/2015 08/18/2015 08/17/2015  Hemoglobin 13.0 - 17.0 g/dL 7.7(L) 8.0(L) 7.7(L) 8.0(L)  Hematocrit 39.0 - 52.0 % 25.8(L) 25.9(L) 25.8(L) 26.1(L)  White count 4.0 - 10.5 K/uL 13.7(H) 15.2(H) 14.2(H) 15.7(H)  Platelet count 150 - 400 K/uL 288 304 335 367  Sodium 135 - 145 mmol/L 143 142 144 146(H)  Potassium 3.5 - 5.1 mmol/L 4.6 4.9 4.7 4.6  Calcium 8.9 - 10.3 mg/dL 9.0 8.9 8.6(L) 8.6(L)  Phosphorus 2.5 - 4.6 mg/dL 3.2 3.3 3.7 5.1(H)  Creatinine 0.61 - 1.24 mg/dL 3.28(H) 3.61(H) 4.28(H) 4.89(H)  AST - (None) (None) (None) (None)  Alk Phos - (None) (None) (None) (None)  Bilirubin - (None) (None) (None) (None)  Glucose 65 - 99 mg/dL 114(H) 122(H) 138(H) 143(H)  Cholesterol - (None) (None) (None) (None)  HDL cholesterol - (None) (None) (None) (None)  Triglycerides - (None) (None) (None) (None)  LDL Direct - (None) (None) (None) (None)  LDL Calc - (None) (None) (None) (None)  Total protein - (None) (None) (None) (None)  Albumin 3.5 - 5.0 g/dL 2.4(L) 2.3(L) 2.1(L) 2.1(L)   No results found for: TSH Lab Results  Component Value Date   BUN 85* 08/20/2015   BUN 101* 08/19/2015   BUN 109* 08/18/2015   Lab Results  Component Value Date   CREATININE 3.28* 08/20/2015   CREATININE 3.61* 08/19/2015   CREATININE 4.28* 08/18/2015   No results found for: HGBA1C     Assessment/Plan  1. Congestive cardiomyopathy ([place]) -Needs follow-up appointment with cardiologist  2. Chronic kidney disease, stage 4 (severe) (H[facility]) -Needs follow-up with nephrologist, Dr.  Erling Cruz  3. Morbid obesity with alveolar hypoventilation ([place]) -Follow-up with primary care physician, Dr. Nilda Simmer. -Needs follow-up on diet and exercise for his obesity. -Schedule sleep study as soon as possible  4. Arthritis -Continue weekly reductions at 10 mg daily of prednisone -Repeat ESR -Follow-up with rheumatologist, [facility] on [facility]  5. Unstable gait -Continue use of four-wheel walker. Patient advised to walk regularly in his neighborhood at least 15 minutes twice daily.  6. Edema, unspecified type -Home care nursing to continue weekly change of Unna boot until wound of the left leg is fully healed. At that  point, patient should be measured and started on compression stockings, bilaterally, thigh high.  7. Anemia, unspecified anemia type -CBC  8. Stasis ulcer, left ([place]) -Continue Unna boots until wound is fully healed. At that time, start firm thigh-high compression stockings bilaterally  9. Rheumatoid arthritis involving both knees with positive rheumatoid factor ([place]) -Follow-up with rheumatologist, [place], on [facility]  10. Hyperglycemia Patient has been on high-dose prednisone due to his elevated sedimentation rate. -BMP, hemoglobin A1c  Examination of patient, discussion of concerns regarding devices to manage his respiratory issues at home, and completion of the discharge note consumed approximately 2 hours.

## 2015-09-19 DIAGNOSIS — I10 Essential (primary) hypertension: Secondary | ICD-10-CM | POA: Diagnosis not present

## 2015-09-27 ENCOUNTER — Non-Acute Institutional Stay (SKILLED_NURSING_FACILITY): Payer: Medicare Other | Admitting: Internal Medicine

## 2015-09-27 DIAGNOSIS — E662 Morbid (severe) obesity with alveolar hypoventilation: Secondary | ICD-10-CM | POA: Diagnosis not present

## 2015-09-27 DIAGNOSIS — I429 Cardiomyopathy, unspecified: Secondary | ICD-10-CM | POA: Diagnosis not present

## 2015-09-27 DIAGNOSIS — I428 Other cardiomyopathies: Secondary | ICD-10-CM

## 2015-09-30 ENCOUNTER — Encounter: Payer: Self-pay | Admitting: Internal Medicine

## 2015-09-30 NOTE — Patient Instructions (Signed)
Schedule visit with primary care physician

## 2015-09-30 NOTE — Progress Notes (Signed)
This encounter was created in error - please disregard.

## 2015-09-30 NOTE — Progress Notes (Signed)
Patient ID: Noah Garner, male   DOB: 02-26-1961, 55 y.o.   MRN: [id number]  Location:  [facility] Provider: Jeanmarie Hubert, M.D.  PCP: Nilda Simmer, MD  Code Status: Full Goals of care:  Advanced Directive information Advanced Directives 08/10/2015  Does patient have an advance directive? No  Would patient like information on creating an advanced directive? No - patient declined information     No Known Allergies  Chief Complaint  Patient presents with  . Discharge Note    HPI:  55 y.o. male  is determined to return home today. When I last saw him, we requested scheduling of a sleep study because of a previous history of a sleep disorder. We think he has morbid obesity with alveolar hypoventilation area he was in respiratory failure when last hospitalized. He has improved since being at the nursing home. He remains on oxygen supplementation because of shortness of breath when it is removed. He is still in the process of getting his sleep study scheduled. Because of a congestive cardiomyopathy, orbit obesity, and previously documented respiratory failure, he is considered a high risk patient for recurrent respiratory failure. It is unfortunate that we have not been able to get this test scheduled to date, but it does appear that all insurance requirements are met in regards to scheduling a sleep study. Patient does not want to wait for this to be done. He is willing to sign out [place].   Review of Systems  Constitutional: Negative for fever.       Morbidly obese. Has been on disability since he was age 24 due to congestive heart failure.  HENT: Negative for congestion, ear pain, hearing loss, sore throat and tinnitus.   Eyes:       Corrective lenses  Respiratory: Positive for shortness of breath. Negative for cough and wheezing.        Previously recommended to be on BiPAP at bedtime.  Cardiovascular: Positive for leg swelling (bilateral lower leg). Negative for chest pain  and palpitations.       History of systolic and diastolic chronic heart failure with ejection fraction of 30-35%. History of nonischemic dilated cardiomyopathy.  Gastrointestinal: Negative for nausea, abdominal pain, diarrhea and constipation.  Genitourinary: Negative for dysuria, urgency and frequency.       Not incontinent. History chronic renal insufficiency, stage IV. Nephrologist as proposed getting him a shunt.  Musculoskeletal: Positive for back pain. Negative for myalgias and neck pain.       History of migratory arthralgias and slightly positive rheumatoid factor.  Skin: Negative for rash.       Chronic lesion anterior left shin.  Neurological: Negative for dizziness, tremors, weakness and headaches.  Endo/Heme/Allergies: Negative for polydipsia. Does not bruise/bleed easily.       History of anemia.  Psychiatric/Behavioral: Negative for hallucinations. The patient is not nervous/anxious.       Past Medical History  Diagnosis Date  . CHF (congestive heart failure) ([place])   . Hypertension   . Arthritis   . Chronic systolic heart failure ([place]) 08/29/2015    No past surgical history on file.    reports that he quit smoking about 20 years ago. He does not have any smokeless tobacco history on file. He reports that he does not drink alcohol or use illicit drugs.  No Known Allergies  Pertinent  Health Maintenance Due  Topic Date Due  . COLONOSCOPY  02/22/2011  . INFLUENZA VACCINE  04/15/2015    Medications:   Medication  List       This list is accurate as of: 09/27/15 11:59 PM.  Always use your most recent med list.               carvedilol 12.5 MG tablet  Commonly known as:  COREG  Take 1 tablet (12.5 mg total) by mouth 2 (two) times daily with a meal.     ferrous sulfate 325 (65 FE) MG EC tablet  Take 1 tablet (325 mg total) by mouth daily with breakfast.     furosemide 40 MG tablet  Commonly known as:  LASIX  Take 60 mg by mouth 2 (two) times daily.      hydrALAZINE 25 MG tablet  Commonly known as:  APRESOLINE  Take 1 tablet (25 mg total) by mouth every 8 (eight) hours.     isosorbide mononitrate 30 MG 24 hr tablet  Commonly known as:  IMDUR  Take 1 tablet (30 mg total) by mouth daily.     predniSONE 20 MG tablet  Commonly known as:  DELTASONE  Take 3 tablets (60 mg total) by mouth daily with breakfast.         Filed Vitals:   09/30/15 1330  BP: 138/70  Pulse: 80  Temp: 98.2 F (36.8 C)  Resp: 20  Weight: 378 lb (171.46 kg)   Body mass index is 52.74 kg/(m^2). Physical Exam  Constitutional: He is oriented to person, place, and time. He appears well-developed and well-nourished. No distress.  Morbidly obese.  HENT:  Right Ear: External ear normal.  Left Ear: External ear normal.  Nose: Nose normal.  Mouth/Throat: Oropharynx is clear and moist. No oropharyngeal exudate.  Eyes: Conjunctivae and EOM are normal. Pupils are equal, round, and reactive to light.  Decreased vision bilaterally  Neck: No JVD present. No tracheal deviation present. No thyromegaly present.  Cardiovascular: Normal rate, regular rhythm, normal heart sounds and intact distal pulses.  Exam reveals no gallop and no friction rub.   No murmur heard. Pulmonary/Chest: No respiratory distress. He has no wheezes. He has no rales. He exhibits no tenderness.  Abdominal: He exhibits no distension and no mass. There is no tenderness.  Musculoskeletal: Normal range of motion. He exhibits no edema or tenderness.  Lymphadenopathy:    He has no cervical adenopathy.  Neurological: He is alert and oriented to person, place, and time. He has normal reflexes. No cranial nerve deficit. Coordination normal.  Skin: No rash noted. No erythema. No pallor.  Chronic leg swelling and a healing small lesion in the left anterior shin.  Psychiatric: He has a normal mood and affect. His behavior is normal. Judgment and thought content normal.    Labs reviewed: Basic Metabolic  Panel:  Recent Labs  08/18/15 0335 08/19/15 0425 08/20/15 0423  NA 144 142 143  K 4.7 4.9 4.6  CL 102 99* 103  CO2 33* 32 34*  GLUCOSE 138* 122* 114*  BUN 109* 101* 85*  CREATININE 4.28* 3.61* 3.28*  CALCIUM 8.6* 8.9 9.0  MG 2.1 2.1 2.1  PHOS 3.7 3.3 3.2   Liver Function Tests:  Recent Labs  08/10/15 1925 08/14/15 0440  08/18/15 0335 08/19/15 0425 08/20/15 0423  AST 49* 27  --   --   --   --   ALT 28 25  --   --   --   --   ALKPHOS 67 70  --   --   --   --   BILITOT 0.8 1.0  --   --   --   --  PROT 7.4 7.7  --   --   --   --   ALBUMIN 2.7* 2.3*  < > 2.1* 2.3* 2.4*  < > = values in this interval not displayed. No results for input(s): LIPASE, AMYLASE in the last 8760 hours.  Recent Labs  08/14/15 0440  AMMONIA 37*   CBC:  Recent Labs  08/10/15 1925  08/14/15 0440  08/15/15 2308  08/18/15 0335 08/19/15 0425 08/20/15 0423  WBC 12.3*  < > 20.7*  < > 15.2*  < > 14.2* 15.2* 13.7*  NEUTROABS 9.4*  --  16.6*  --  13.1*  --   --   --   --   HGB 8.6*  < > 7.8*  < > 8.1*  < > 7.7* 8.0* 7.7*  HCT 26.3*  < > 23.8*  < > 25.7*  < > 25.8* 25.9* 25.8*  MCV 79.9  < > 79.3  < > 82.1  < > 83.0 83.0 83.2  PLT 267  < > 292  < > 330  < > 335 304 288  < > = values in this interval not displayed. Cardiac Enzymes: No results for input(s): CKTOTAL, CKMB, CKMBINDEX, TROPONINI in the last 8760 hours. BNP: Invalid input(s): POCBNP CBG:  Recent Labs  08/20/15 0757 08/20/15 1108 08/20/15 1602  GLUCAP 75 167* 250*    Procedures and Imaging Studies During Stay: No results found.  Assessment/Plan:   1. Morbid obesity with alveolar hypoventilation (HCC) chronic condition with little evidence of weight loss. Obesity contributes to alveolar hypoventilation.  2. NICM (nonischemic cardiomyopathy) ([place]) likely contributor to the chronic lower extremity edema    Patient is discharged  at his request, and [place], with the following home health services:     home health nursing Recommendation for scheduling outpatient sleep study   Patient is being discharged with the following durable medical equipment:   recommended CPAP pending completion of outpatient sleep study   Patient has been advised to f/u with their PCP in 1-2 weeks to bring them up to date on their rehab stay.  Social services at facility was responsible for arranging this appointment.  Pt was provided with a 30 day supply of prescriptions for medications and refills must be obtained from their PCP.  For controlled substances, a more limited supply may be provided adequate until PCP appointment only.  Future labs/tests needed:   Outpatient sleep study to determine the need for CPAP versus BiPAP follow-up CMP and CBC

## 2015-10-01 DIAGNOSIS — I420 Dilated cardiomyopathy: Secondary | ICD-10-CM | POA: Diagnosis not present

## 2015-10-01 DIAGNOSIS — Z5320 Procedure and treatment not carried out because of patient's decision for unspecified reasons: Secondary | ICD-10-CM | POA: Diagnosis not present

## 2015-10-01 DIAGNOSIS — G4733 Obstructive sleep apnea (adult) (pediatric): Secondary | ICD-10-CM | POA: Diagnosis not present

## 2015-10-01 DIAGNOSIS — R600 Localized edema: Secondary | ICD-10-CM | POA: Diagnosis not present

## 2015-10-01 DIAGNOSIS — R2681 Unsteadiness on feet: Secondary | ICD-10-CM | POA: Diagnosis not present

## 2015-10-01 DIAGNOSIS — I10 Essential (primary) hypertension: Secondary | ICD-10-CM | POA: Diagnosis not present

## 2015-10-01 DIAGNOSIS — E662 Morbid (severe) obesity with alveolar hypoventilation: Secondary | ICD-10-CM | POA: Diagnosis not present

## 2015-10-01 DIAGNOSIS — N184 Chronic kidney disease, stage 4 (severe): Secondary | ICD-10-CM | POA: Diagnosis not present

## 2015-10-01 DIAGNOSIS — M069 Rheumatoid arthritis, unspecified: Secondary | ICD-10-CM | POA: Diagnosis not present

## 2015-10-01 DIAGNOSIS — I5043 Acute on chronic combined systolic (congestive) and diastolic (congestive) heart failure: Secondary | ICD-10-CM | POA: Diagnosis not present

## 2015-10-17 DIAGNOSIS — E669 Obesity, unspecified: Secondary | ICD-10-CM | POA: Diagnosis not present

## 2015-10-17 DIAGNOSIS — N189 Chronic kidney disease, unspecified: Secondary | ICD-10-CM | POA: Diagnosis not present

## 2015-10-17 DIAGNOSIS — I129 Hypertensive chronic kidney disease with stage 1 through stage 4 chronic kidney disease, or unspecified chronic kidney disease: Secondary | ICD-10-CM | POA: Diagnosis not present

## 2015-10-17 DIAGNOSIS — N184 Chronic kidney disease, stage 4 (severe): Secondary | ICD-10-CM | POA: Diagnosis not present

## 2015-10-17 DIAGNOSIS — I428 Other cardiomyopathies: Secondary | ICD-10-CM | POA: Diagnosis not present

## 2015-11-18 DIAGNOSIS — G4733 Obstructive sleep apnea (adult) (pediatric): Secondary | ICD-10-CM | POA: Diagnosis not present

## 2015-11-25 DIAGNOSIS — G4734 Idiopathic sleep related nonobstructive alveolar hypoventilation: Secondary | ICD-10-CM | POA: Diagnosis not present

## 2015-11-25 DIAGNOSIS — I10 Essential (primary) hypertension: Secondary | ICD-10-CM | POA: Diagnosis not present

## 2015-11-25 DIAGNOSIS — G4733 Obstructive sleep apnea (adult) (pediatric): Secondary | ICD-10-CM | POA: Diagnosis not present

## 2015-11-27 ENCOUNTER — Encounter: Payer: Self-pay | Admitting: Cardiology

## 2015-11-27 DIAGNOSIS — G4734 Idiopathic sleep related nonobstructive alveolar hypoventilation: Secondary | ICD-10-CM | POA: Diagnosis not present

## 2015-11-27 DIAGNOSIS — G4733 Obstructive sleep apnea (adult) (pediatric): Secondary | ICD-10-CM | POA: Diagnosis not present

## 2015-11-27 NOTE — Progress Notes (Signed)
This encounter was created in error - please disregard.

## 2015-11-28 ENCOUNTER — Encounter: Payer: Medicare Other | Admitting: Cardiology

## 2016-01-02 DIAGNOSIS — M05761 Rheumatoid arthritis with rheumatoid factor of right knee without organ or systems involvement: Secondary | ICD-10-CM | POA: Diagnosis not present

## 2016-01-02 DIAGNOSIS — E662 Morbid (severe) obesity with alveolar hypoventilation: Secondary | ICD-10-CM | POA: Diagnosis not present

## 2016-01-02 DIAGNOSIS — I420 Dilated cardiomyopathy: Secondary | ICD-10-CM | POA: Diagnosis not present

## 2016-01-02 DIAGNOSIS — R2681 Unsteadiness on feet: Secondary | ICD-10-CM | POA: Diagnosis not present

## 2016-01-02 DIAGNOSIS — M05762 Rheumatoid arthritis with rheumatoid factor of left knee without organ or systems involvement: Secondary | ICD-10-CM | POA: Diagnosis not present

## 2016-01-02 DIAGNOSIS — R531 Weakness: Secondary | ICD-10-CM | POA: Diagnosis not present

## 2016-01-03 ENCOUNTER — Telehealth: Payer: Self-pay | Admitting: Internal Medicine

## 2016-01-03 NOTE — Telephone Encounter (Signed)
Error

## 2016-01-06 DIAGNOSIS — I10 Essential (primary) hypertension: Secondary | ICD-10-CM | POA: Diagnosis not present

## 2016-01-06 DIAGNOSIS — I420 Dilated cardiomyopathy: Secondary | ICD-10-CM | POA: Diagnosis not present

## 2016-01-06 DIAGNOSIS — G4733 Obstructive sleep apnea (adult) (pediatric): Secondary | ICD-10-CM | POA: Diagnosis not present

## 2016-01-20 DIAGNOSIS — E669 Obesity, unspecified: Secondary | ICD-10-CM | POA: Diagnosis not present

## 2016-01-20 DIAGNOSIS — N184 Chronic kidney disease, stage 4 (severe): Secondary | ICD-10-CM | POA: Diagnosis not present

## 2016-01-20 DIAGNOSIS — I428 Other cardiomyopathies: Secondary | ICD-10-CM | POA: Diagnosis not present

## 2016-01-20 DIAGNOSIS — I129 Hypertensive chronic kidney disease with stage 1 through stage 4 chronic kidney disease, or unspecified chronic kidney disease: Secondary | ICD-10-CM | POA: Diagnosis not present

## 2016-01-31 ENCOUNTER — Encounter (HOSPITAL_COMMUNITY)
Admission: RE | Admit: 2016-01-31 | Discharge: 2016-01-31 | Disposition: A | Discharge status: Home / Self Care | Payer: Medicare Other | Source: Ambulatory Visit | Attending: Nephrology | Admitting: Nephrology

## 2016-01-31 DIAGNOSIS — D638 Anemia in other chronic diseases classified elsewhere: Secondary | ICD-10-CM | POA: Diagnosis not present

## 2016-01-31 DIAGNOSIS — N183 Chronic kidney disease, stage 3 (moderate): Secondary | ICD-10-CM | POA: Diagnosis not present

## 2016-01-31 LAB — RENAL FUNCTION PANEL
ALBUMIN: 2.9 g/dL — AB (ref 3.5–5.0)
ANION GAP: 11 (ref 5–15)
BUN: 60 mg/dL — AB (ref 6–20)
CHLORIDE: 104 mmol/L (ref 101–111)
CO2: 26 mmol/L (ref 22–32)
Calcium: 9.4 mg/dL (ref 8.9–10.3)
Creatinine, Ser: 3.82 mg/dL — ABNORMAL HIGH (ref 0.61–1.24)
GFR calc Af Amer: 19 mL/min — ABNORMAL LOW (ref >60)
GFR calc non Af Amer: 17 mL/min — ABNORMAL LOW (ref >60)
GLUCOSE: 132 mg/dL — AB (ref 65–99)
PHOSPHORUS: 4.4 mg/dL (ref 2.5–4.6)
POTASSIUM: 5 mmol/L (ref 3.5–5.1)
Sodium: 141 mmol/L (ref 135–145)

## 2016-01-31 MED ORDER — EPOETIN ALFA 20000 UNIT/ML IJ SOLN: 20000.0000 [IU] | INTRAMUSCULAR | Status: DC

## 2016-01-31 MED ORDER — CLONIDINE HCL 0.1 MG PO TABS: 0.1000 mg | ORAL_TABLET | Freq: Once | ORAL | Status: DC | PRN

## 2016-01-31 MED ORDER — EPOETIN ALFA 20000 UNIT/ML IJ SOLN
INTRAMUSCULAR | Status: AC
  Administered 2016-01-31: 20000 [IU] via SUBCUTANEOUS
  Filled 2016-01-31: qty 1

## 2016-01-31 NOTE — Discharge Instructions (Signed)
Epoetin Alfa injection What is this medicine? EPOETIN ALFA (e POE e tin AL fa) helps your body make more red blood cells. This medicine is used to treat anemia caused by chronic kidney failure, cancer chemotherapy, or HIV-therapy. It may also be used before surgery if you have anemia. This medicine may be used for other purposes; ask your health care provider or pharmacist if you have questions. What should I tell my health care provider before I take this medicine? They need to know if you have any of these conditions: -blood clotting disorders -cancer patient not on chemotherapy -cystic fibrosis -heart disease, such as angina or heart failure -hemoglobin level of 12 g/dL or greater -high blood pressure -low levels of folate, iron, or vitamin B12 -seizures -an unusual or allergic reaction to erythropoietin, albumin, benzyl alcohol, hamster proteins, other medicines, foods, dyes, or preservatives -pregnant or trying to get pregnant -breast-feeding How should I use this medicine? This medicine is for injection into a vein or under the skin. It is usually given by a health care professional in a hospital or clinic setting. If you get this medicine at home, you will be taught how to prepare and give this medicine. Use exactly as directed. Take your medicine at regular intervals. Do not take your medicine more often than directed. It is important that you put your used needles and syringes in a special sharps container. Do not put them in a trash can. If you do not have a sharps container, call your pharmacist or healthcare provider to get one. Talk to your pediatrician regarding the use of this medicine in children. While this drug may be prescribed for selected conditions, precautions do apply. Overdosage: If you think you have taken too much of this medicine contact a poison control center or emergency room at once. NOTE: This medicine is only for you. Do not share this medicine with  others. What if I miss a dose? If you miss a dose, take it as soon as you can. If it is almost time for your next dose, take only that dose. Do not take double or extra doses. What may interact with this medicine? Do not take this medicine with any of the following medications: -darbepoetin alfa This list may not describe all possible interactions. Give your health care provider a list of all the medicines, herbs, non-prescription drugs, or dietary supplements you use. Also tell them if you smoke, drink alcohol, or use illegal drugs. Some items may interact with your medicine. What should I watch for while using this medicine? Visit your prescriber or health care professional for regular checks on your progress and for the needed blood tests and blood pressure measurements. It is especially important for the doctor to make sure your hemoglobin level is in the desired range, to limit the risk of potential side effects and to give you the best benefit. Keep all appointments for any recommended tests. Check your blood pressure as directed. Ask your doctor what your blood pressure should be and when you should contact him or her. As your body makes more red blood cells, you may need to take iron, folic acid, or vitamin B supplements. Ask your doctor or health care provider which products are right for you. If you have kidney disease continue dietary restrictions, even though this medication can make you feel better. Talk with your doctor or health care professional about the foods you eat and the vitamins that you take. What side effects may I notice   from receiving this medicine? Side effects that you should report to your doctor or health care professional as soon as possible: -allergic reactions like skin rash, itching or hives, swelling of the face, lips, or tongue -breathing problems -changes in vision -chest pain -confusion, trouble speaking or understanding -feeling faint or lightheaded,  falls -high blood pressure -muscle aches or pains -pain, swelling, warmth in the leg -rapid weight gain -severe headaches -sudden numbness or weakness of the face, arm or leg -trouble walking, dizziness, loss of balance or coordination -seizures (convulsions) -swelling of the ankles, feet, hands -unusually weak or tired Side effects that usually do not require medical attention (report to your doctor or health care professional if they continue or are bothersome): -diarrhea -fever, chills (flu-like symptoms) -headaches -nausea, vomiting -redness, stinging, or swelling at site where injected This list may not describe all possible side effects. Call your doctor for medical advice about side effects. You may report side effects to FDA at 1-800-FDA-1088. Where should I keep my medicine? Keep out of the reach of children. Store in a refrigerator between 2 and 8 degrees C (36 and 46 degrees F). Do not freeze or shake. Throw away any unused portion if using a single-dose vial. Multi-dose vials can be kept in the refrigerator for up to 21 days after the initial dose. Throw away unused medicine. NOTE: This sheet is a summary. It may not cover all possible information. If you have questions about this medicine, talk to your doctor, pharmacist, or health care provider.    2016, Elsevier/Gold Standard. (2008-08-14 10:25:44)  

## 2016-01-31 NOTE — Progress Notes (Signed)
hgb 7.6 reported to [place] at [facility]. No new orders.

## 2016-02-03 LAB — POCT HEMOGLOBIN-HEMACUE: HEMOGLOBIN: 7.6 g/dL — AB (ref 13.0–17.0)

## 2016-02-13 ENCOUNTER — Other Ambulatory Visit (HOSPITAL_COMMUNITY): Payer: Self-pay | Admitting: *Deleted

## 2016-02-14 ENCOUNTER — Encounter (HOSPITAL_COMMUNITY)
Admission: RE | Admit: 2016-02-14 | Discharge: 2016-02-14 | Disposition: A | Discharge status: Home / Self Care | Payer: Medicare Other | Source: Ambulatory Visit | Attending: Nephrology | Admitting: Nephrology

## 2016-02-14 DIAGNOSIS — N183 Chronic kidney disease, stage 3 (moderate): Secondary | ICD-10-CM | POA: Insufficient documentation

## 2016-02-14 DIAGNOSIS — D638 Anemia in other chronic diseases classified elsewhere: Secondary | ICD-10-CM | POA: Diagnosis not present

## 2016-02-14 LAB — POCT HEMOGLOBIN-HEMACUE: HEMOGLOBIN: 8.8 g/dL — AB (ref 13.0–17.0)

## 2016-02-14 MED ORDER — CLONIDINE HCL 0.1 MG PO TABS: 0.1000 mg | ORAL_TABLET | Freq: Once | ORAL | Status: DC | PRN

## 2016-02-14 MED ORDER — SODIUM CHLORIDE 0.9 % IV SOLN
510.0000 mg | Freq: Once | INTRAVENOUS | Status: AC
  Administered 2016-02-14: 510 mg via INTRAVENOUS
  Filled 2016-02-14: qty 17

## 2016-02-14 MED ORDER — EPOETIN ALFA 20000 UNIT/ML IJ SOLN: 20000.0000 [IU] | INTRAMUSCULAR | Status: DC

## 2016-02-14 MED ORDER — EPOETIN ALFA 20000 UNIT/ML IJ SOLN
INTRAMUSCULAR | Status: AC
  Administered 2016-02-14: 20000 [IU] via SUBCUTANEOUS
  Filled 2016-02-14: qty 1

## 2016-02-14 NOTE — Discharge Instructions (Signed)

## 2016-02-18 DIAGNOSIS — M79641 Pain in right hand: Secondary | ICD-10-CM | POA: Diagnosis not present

## 2016-02-18 DIAGNOSIS — M2550 Pain in unspecified joint: Secondary | ICD-10-CM | POA: Diagnosis not present

## 2016-02-18 DIAGNOSIS — M19042 Primary osteoarthritis, left hand: Secondary | ICD-10-CM | POA: Diagnosis not present

## 2016-02-18 DIAGNOSIS — R768 Other specified abnormal immunological findings in serum: Secondary | ICD-10-CM | POA: Diagnosis not present

## 2016-02-18 DIAGNOSIS — M19041 Primary osteoarthritis, right hand: Secondary | ICD-10-CM | POA: Diagnosis not present

## 2016-02-18 DIAGNOSIS — M79642 Pain in left hand: Secondary | ICD-10-CM | POA: Diagnosis not present

## 2016-02-28 ENCOUNTER — Encounter (HOSPITAL_COMMUNITY)
Admission: RE | Admit: 2016-02-28 | Discharge: 2016-02-28 | Disposition: A | Discharge status: Home / Self Care | Payer: Medicare Other | Source: Ambulatory Visit | Attending: Nephrology | Admitting: Nephrology

## 2016-02-28 DIAGNOSIS — D638 Anemia in other chronic diseases classified elsewhere: Secondary | ICD-10-CM | POA: Diagnosis not present

## 2016-02-28 DIAGNOSIS — N183 Chronic kidney disease, stage 3 (moderate): Secondary | ICD-10-CM | POA: Diagnosis not present

## 2016-02-28 LAB — FERRITIN: Ferritin: 265 ng/mL (ref 24–336)

## 2016-02-28 LAB — IRON AND TIBC
IRON: 51 ug/dL (ref 45–182)
SATURATION RATIOS: 19 % (ref 17.9–39.5)
TIBC: 262 ug/dL (ref 250–450)
UIBC: 211 ug/dL

## 2016-02-28 LAB — POCT HEMOGLOBIN-HEMACUE: Hemoglobin: 9.2 g/dL — ABNORMAL LOW (ref 13.0–17.0)

## 2016-02-28 MED ORDER — EPOETIN ALFA 20000 UNIT/ML IJ SOLN
20000.0000 [IU] | INTRAMUSCULAR | Status: DC
Start: 2016-02-28 — End: 2016-02-29
  Administered 2016-02-28: 20000 [IU] via SUBCUTANEOUS

## 2016-02-28 MED ORDER — EPOETIN ALFA 20000 UNIT/ML IJ SOLN
INTRAMUSCULAR | Status: AC
  Administered 2016-02-28: 20000 [IU] via SUBCUTANEOUS
  Filled 2016-02-28: qty 1

## 2016-02-29 LAB — PTH, INTACT AND CALCIUM
CALCIUM TOTAL (PTH): 8.8 mg/dL (ref 8.7–10.2)
PTH: 460 pg/mL — AB (ref 15–65)

## 2016-03-13 ENCOUNTER — Encounter (HOSPITAL_COMMUNITY)
Admission: RE | Admit: 2016-03-13 | Discharge: 2016-03-13 | Disposition: A | Discharge status: Home / Self Care | Payer: Medicare Other | Source: Ambulatory Visit | Attending: Nephrology | Admitting: Nephrology

## 2016-03-13 DIAGNOSIS — D638 Anemia in other chronic diseases classified elsewhere: Secondary | ICD-10-CM | POA: Diagnosis not present

## 2016-03-13 DIAGNOSIS — N183 Chronic kidney disease, stage 3 (moderate): Secondary | ICD-10-CM | POA: Diagnosis not present

## 2016-03-13 LAB — POCT HEMOGLOBIN-HEMACUE: Hemoglobin: 10.3 g/dL — ABNORMAL LOW (ref 13.0–17.0)

## 2016-03-13 MED ORDER — EPOETIN ALFA 20000 UNIT/ML IJ SOLN
INTRAMUSCULAR | Status: AC
  Filled 2016-03-13: qty 1

## 2016-03-13 MED ORDER — EPOETIN ALFA 20000 UNIT/ML IJ SOLN
20000.0000 [IU] | INTRAMUSCULAR | Status: DC
  Administered 2016-03-13: 20000 [IU] via SUBCUTANEOUS

## 2016-03-16 DIAGNOSIS — M1990 Unspecified osteoarthritis, unspecified site: Secondary | ICD-10-CM | POA: Diagnosis not present

## 2016-03-16 DIAGNOSIS — R768 Other specified abnormal immunological findings in serum: Secondary | ICD-10-CM | POA: Diagnosis not present

## 2016-03-16 DIAGNOSIS — M2550 Pain in unspecified joint: Secondary | ICD-10-CM | POA: Diagnosis not present

## 2016-03-27 ENCOUNTER — Encounter (HOSPITAL_COMMUNITY)
Admission: RE | Admit: 2016-03-27 | Discharge: 2016-03-27 | Disposition: A | Discharge status: Home / Self Care | Payer: Medicare Other | Source: Ambulatory Visit | Attending: Nephrology | Admitting: Nephrology

## 2016-03-27 DIAGNOSIS — N183 Chronic kidney disease, stage 3 (moderate): Secondary | ICD-10-CM | POA: Insufficient documentation

## 2016-03-27 DIAGNOSIS — D638 Anemia in other chronic diseases classified elsewhere: Secondary | ICD-10-CM | POA: Insufficient documentation

## 2016-03-27 LAB — IRON AND TIBC
Iron: 55 ug/dL (ref 45–182)
Saturation Ratios: 20 % (ref 17.9–39.5)
TIBC: 272 ug/dL (ref 250–450)
UIBC: 217 ug/dL

## 2016-03-27 LAB — FERRITIN: FERRITIN: 172 ng/mL (ref 24–336)

## 2016-03-27 LAB — POCT HEMOGLOBIN-HEMACUE: Hemoglobin: 11.1 g/dL — ABNORMAL LOW (ref 13.0–17.0)

## 2016-03-27 MED ORDER — EPOETIN ALFA 20000 UNIT/ML IJ SOLN
INTRAMUSCULAR | Status: AC
  Filled 2016-03-27: qty 1

## 2016-03-27 MED ORDER — EPOETIN ALFA 20000 UNIT/ML IJ SOLN
20000.0000 [IU] | INTRAMUSCULAR | Status: DC
  Administered 2016-03-27: 20000 [IU] via SUBCUTANEOUS

## 2016-03-30 DIAGNOSIS — I10 Essential (primary) hypertension: Secondary | ICD-10-CM | POA: Diagnosis not present

## 2016-03-30 DIAGNOSIS — I420 Dilated cardiomyopathy: Secondary | ICD-10-CM | POA: Diagnosis not present

## 2016-03-30 DIAGNOSIS — N184 Chronic kidney disease, stage 4 (severe): Secondary | ICD-10-CM | POA: Diagnosis not present

## 2016-03-30 DIAGNOSIS — G4733 Obstructive sleep apnea (adult) (pediatric): Secondary | ICD-10-CM | POA: Diagnosis not present

## 2016-04-08 DIAGNOSIS — H5203 Hypermetropia, bilateral: Secondary | ICD-10-CM | POA: Diagnosis not present

## 2016-04-08 DIAGNOSIS — H2513 Age-related nuclear cataract, bilateral: Secondary | ICD-10-CM | POA: Diagnosis not present

## 2016-04-08 DIAGNOSIS — H1859 Other hereditary corneal dystrophies: Secondary | ICD-10-CM | POA: Diagnosis not present

## 2016-04-08 DIAGNOSIS — H524 Presbyopia: Secondary | ICD-10-CM | POA: Diagnosis not present

## 2016-04-09 ENCOUNTER — Other Ambulatory Visit: Payer: Self-pay | Admitting: Nephrology

## 2016-04-09 ENCOUNTER — Other Ambulatory Visit (HOSPITAL_COMMUNITY): Payer: Self-pay | Admitting: *Deleted

## 2016-04-09 DIAGNOSIS — D638 Anemia in other chronic diseases classified elsewhere: Secondary | ICD-10-CM

## 2016-04-10 ENCOUNTER — Encounter (HOSPITAL_COMMUNITY)
Admission: RE | Admit: 2016-04-10 | Discharge: 2016-04-10 | Disposition: A | Discharge status: Home / Self Care | Payer: Medicare Other | Source: Ambulatory Visit | Attending: Nephrology | Admitting: Nephrology

## 2016-04-10 DIAGNOSIS — D638 Anemia in other chronic diseases classified elsewhere: Secondary | ICD-10-CM | POA: Diagnosis not present

## 2016-04-10 DIAGNOSIS — N183 Chronic kidney disease, stage 3 (moderate): Secondary | ICD-10-CM | POA: Diagnosis not present

## 2016-04-10 LAB — POCT HEMOGLOBIN-HEMACUE: Hemoglobin: 10.6 g/dL — ABNORMAL LOW (ref 13.0–17.0)

## 2016-04-10 MED ORDER — EPOETIN ALFA 20000 UNIT/ML IJ SOLN
INTRAMUSCULAR | Status: AC
  Filled 2016-04-10: qty 1

## 2016-04-10 MED ORDER — EPOETIN ALFA 20000 UNIT/ML IJ SOLN
20000.0000 [IU] | INTRAMUSCULAR | Status: DC
  Administered 2016-04-10: 20000 [IU] via SUBCUTANEOUS

## 2016-04-10 MED ORDER — SODIUM CHLORIDE 0.9 % IV SOLN
510.0000 mg | Freq: Once | INTRAVENOUS | Status: AC
  Administered 2016-04-10: 510 mg via INTRAVENOUS
  Filled 2016-04-10: qty 17

## 2016-04-14 DIAGNOSIS — I420 Dilated cardiomyopathy: Secondary | ICD-10-CM | POA: Diagnosis not present

## 2016-04-24 ENCOUNTER — Encounter (HOSPITAL_COMMUNITY)
Admission: RE | Admit: 2016-04-24 | Discharge: 2016-04-24 | Disposition: A | Discharge status: Home / Self Care | Payer: Medicare Other | Source: Ambulatory Visit | Attending: Nephrology | Admitting: Nephrology

## 2016-04-24 DIAGNOSIS — N183 Chronic kidney disease, stage 3 (moderate): Secondary | ICD-10-CM | POA: Diagnosis not present

## 2016-04-24 DIAGNOSIS — D638 Anemia in other chronic diseases classified elsewhere: Secondary | ICD-10-CM | POA: Diagnosis not present

## 2016-04-24 LAB — RENAL FUNCTION PANEL
Albumin: 3.6 g/dL (ref 3.5–5.0)
Anion gap: 10 (ref 5–15)
BUN: 75 mg/dL — AB (ref 6–20)
CHLORIDE: 102 mmol/L (ref 101–111)
CO2: 23 mmol/L (ref 22–32)
CREATININE: 4.06 mg/dL — AB (ref 0.61–1.24)
Calcium: 9.1 mg/dL (ref 8.9–10.3)
GFR calc Af Amer: 18 mL/min — ABNORMAL LOW (ref >60)
GFR, EST NON AFRICAN AMERICAN: 15 mL/min — AB (ref >60)
GLUCOSE: 136 mg/dL — AB (ref 65–99)
Phosphorus: 3.9 mg/dL (ref 2.5–4.6)
Potassium: 4.4 mmol/L (ref 3.5–5.1)
Sodium: 135 mmol/L (ref 135–145)

## 2016-04-24 LAB — IRON AND TIBC
IRON: 67 ug/dL (ref 45–182)
Saturation Ratios: 27 % (ref 17.9–39.5)
TIBC: 246 ug/dL — ABNORMAL LOW (ref 250–450)
UIBC: 179 ug/dL

## 2016-04-24 LAB — POCT HEMOGLOBIN-HEMACUE: HEMOGLOBIN: 10.6 g/dL — AB (ref 13.0–17.0)

## 2016-04-24 LAB — FERRITIN: FERRITIN: 541 ng/mL — AB (ref 24–336)

## 2016-04-24 MED ORDER — EPOETIN ALFA 20000 UNIT/ML IJ SOLN
20000.0000 [IU] | INTRAMUSCULAR | Status: DC
  Administered 2016-04-24: 20000 [IU] via SUBCUTANEOUS

## 2016-04-24 MED ORDER — EPOETIN ALFA 20000 UNIT/ML IJ SOLN
INTRAMUSCULAR | Status: AC
  Filled 2016-04-24: qty 1

## 2016-05-19 DIAGNOSIS — R768 Other specified abnormal immunological findings in serum: Secondary | ICD-10-CM | POA: Diagnosis not present

## 2016-05-19 DIAGNOSIS — M1990 Unspecified osteoarthritis, unspecified site: Secondary | ICD-10-CM | POA: Diagnosis not present

## 2016-05-19 DIAGNOSIS — M2550 Pain in unspecified joint: Secondary | ICD-10-CM | POA: Diagnosis not present

## 2016-05-21 DIAGNOSIS — Z9989 Dependence on other enabling machines and devices: Secondary | ICD-10-CM | POA: Diagnosis not present

## 2016-05-21 DIAGNOSIS — I428 Other cardiomyopathies: Secondary | ICD-10-CM | POA: Diagnosis not present

## 2016-05-21 DIAGNOSIS — E669 Obesity, unspecified: Secondary | ICD-10-CM | POA: Diagnosis not present

## 2016-05-21 DIAGNOSIS — N184 Chronic kidney disease, stage 4 (severe): Secondary | ICD-10-CM | POA: Diagnosis not present

## 2016-05-21 DIAGNOSIS — D631 Anemia in chronic kidney disease: Secondary | ICD-10-CM | POA: Diagnosis not present

## 2016-05-21 DIAGNOSIS — G4733 Obstructive sleep apnea (adult) (pediatric): Secondary | ICD-10-CM | POA: Diagnosis not present

## 2016-05-21 DIAGNOSIS — I129 Hypertensive chronic kidney disease with stage 1 through stage 4 chronic kidney disease, or unspecified chronic kidney disease: Secondary | ICD-10-CM | POA: Diagnosis not present

## 2016-05-22 ENCOUNTER — Encounter (HOSPITAL_COMMUNITY)
Admission: RE | Admit: 2016-05-22 | Discharge: 2016-05-22 | Disposition: A | Discharge status: Home / Self Care | Payer: Medicare Other | Source: Ambulatory Visit | Attending: Nephrology | Admitting: Nephrology

## 2016-05-22 DIAGNOSIS — N183 Chronic kidney disease, stage 3 (moderate): Secondary | ICD-10-CM | POA: Insufficient documentation

## 2016-05-22 DIAGNOSIS — D638 Anemia in other chronic diseases classified elsewhere: Secondary | ICD-10-CM | POA: Insufficient documentation

## 2016-05-22 LAB — FERRITIN: Ferritin: 422 ng/mL — ABNORMAL HIGH (ref 24–336)

## 2016-05-22 LAB — IRON AND TIBC
Iron: 78 ug/dL (ref 45–182)
Saturation Ratios: 33 % (ref 17.9–39.5)
TIBC: 239 ug/dL — ABNORMAL LOW (ref 250–450)
UIBC: 161 ug/dL

## 2016-05-22 LAB — POCT HEMOGLOBIN-HEMACUE: Hemoglobin: 11 g/dL — ABNORMAL LOW (ref 13.0–17.0)

## 2016-05-22 MED ORDER — EPOETIN ALFA 20000 UNIT/ML IJ SOLN
20000.0000 [IU] | INTRAMUSCULAR | Status: DC
Start: 2016-05-22 — End: 2016-05-23
  Administered 2016-05-22: 20000 [IU] via SUBCUTANEOUS

## 2016-05-22 MED ORDER — EPOETIN ALFA 20000 UNIT/ML IJ SOLN
INTRAMUSCULAR | Status: AC
  Filled 2016-05-22: qty 1

## 2016-05-28 ENCOUNTER — Other Ambulatory Visit: Payer: Self-pay

## 2016-05-28 DIAGNOSIS — N184 Chronic kidney disease, stage 4 (severe): Secondary | ICD-10-CM

## 2016-05-28 DIAGNOSIS — Z01810 Encounter for preprocedural cardiovascular examination: Secondary | ICD-10-CM

## 2016-06-19 ENCOUNTER — Encounter (HOSPITAL_COMMUNITY)
Admission: RE | Admit: 2016-06-19 | Discharge: 2016-06-19 | Disposition: A | Discharge status: Home / Self Care | Payer: Medicare Other | Source: Ambulatory Visit | Attending: Nephrology | Admitting: Nephrology

## 2016-06-19 DIAGNOSIS — D638 Anemia in other chronic diseases classified elsewhere: Secondary | ICD-10-CM

## 2016-06-19 DIAGNOSIS — N183 Chronic kidney disease, stage 3 (moderate): Secondary | ICD-10-CM | POA: Diagnosis not present

## 2016-06-19 LAB — IRON AND TIBC
Iron: 52 ug/dL (ref 45–182)
Saturation Ratios: 20 % (ref 17.9–39.5)
TIBC: 259 ug/dL (ref 250–450)
UIBC: 207 ug/dL

## 2016-06-19 LAB — POCT HEMOGLOBIN-HEMACUE: Hemoglobin: 11.3 g/dL — ABNORMAL LOW (ref 13.0–17.0)

## 2016-06-19 LAB — FERRITIN: Ferritin: 338 ng/mL — ABNORMAL HIGH (ref 24–336)

## 2016-06-19 MED ORDER — EPOETIN ALFA 20000 UNIT/ML IJ SOLN
INTRAMUSCULAR | Status: AC
  Filled 2016-06-19: qty 1

## 2016-06-19 MED ORDER — EPOETIN ALFA 20000 UNIT/ML IJ SOLN
20000.0000 [IU] | INTRAMUSCULAR | Status: DC
Start: 2016-06-19 — End: 2016-06-20
  Administered 2016-06-19: 20000 [IU] via SUBCUTANEOUS

## 2016-06-20 LAB — PTH, INTACT AND CALCIUM
Calcium, Total (PTH): 9.5 mg/dL (ref 8.7–10.2)
PTH: 242 pg/mL — AB (ref 15–65)

## 2016-06-23 ENCOUNTER — Encounter: Payer: Self-pay | Admitting: Vascular Surgery

## 2016-06-24 ENCOUNTER — Other Ambulatory Visit (HOSPITAL_COMMUNITY): Payer: Medicare Other

## 2016-06-24 ENCOUNTER — Encounter (HOSPITAL_COMMUNITY): Payer: Medicare Other

## 2016-06-24 ENCOUNTER — Ambulatory Visit: Payer: Medicare Other | Admitting: Vascular Surgery

## 2016-06-26 ENCOUNTER — Ambulatory Visit (HOSPITAL_COMMUNITY)
Admission: RE | Admit: 2016-06-26 | Discharge: 2016-06-26 | Disposition: A | Discharge status: Home / Self Care | Payer: Medicare Other | Source: Ambulatory Visit | Attending: Vascular Surgery | Admitting: Vascular Surgery

## 2016-06-26 ENCOUNTER — Ambulatory Visit (INDEPENDENT_AMBULATORY_CARE_PROVIDER_SITE_OTHER): Payer: Medicare Other | Admitting: Vascular Surgery

## 2016-06-26 ENCOUNTER — Encounter: Payer: Self-pay | Admitting: Vascular Surgery

## 2016-06-26 ENCOUNTER — Ambulatory Visit (INDEPENDENT_AMBULATORY_CARE_PROVIDER_SITE_OTHER)
Admission: RE | Admit: 2016-06-26 | Discharge: 2016-06-26 | Disposition: A | Discharge status: Home / Self Care | Payer: Medicare Other | Source: Ambulatory Visit | Attending: Vascular Surgery | Admitting: Vascular Surgery

## 2016-06-26 ENCOUNTER — Other Ambulatory Visit: Payer: Self-pay

## 2016-06-26 VITALS — BP 116/73 | HR 67 | Temp 97.0°F | Resp 20 | Ht 71.0 in | Wt 383.0 lb

## 2016-06-26 DIAGNOSIS — Z01810 Encounter for preprocedural cardiovascular examination: Secondary | ICD-10-CM

## 2016-06-26 DIAGNOSIS — N184 Chronic kidney disease, stage 4 (severe): Secondary | ICD-10-CM

## 2016-06-26 NOTE — H&P (Signed)
Patient ID: Noah Garner, male   DOB: 1961/08/04, 55 y.o.   MRN: [id number]  Reason for Consult: New Evaluation (CKD stage 4-Eval for AV access. )   Referred by Delilah Shan, MD  Subjective:     HPI:  Noah Garner is a 55 y.o. male patient of Dr. Florene Glen with CK D5. He has never had an operation of his bilateral upper extremities and has not had catheters pacemakers or defibrillators. He has full function of his bilateral upper extremities and is right-hand dominant. He was sent for evaluation of new dialysis access.  Past Medical History:  Diagnosis Date  . Arthritis   . CHF (congestive heart failure) ([place])   . Chronic systolic heart failure ([place]) 08/29/2015  . Hypertension    Family History  Problem Relation Age of Onset  . Hypertension Mother    No past surgical history on file.  Short Social History:  Social History  Substance Use Topics  . Smoking status: Former Smoker    Quit date: 08/12/1995  . Smokeless tobacco: Not on file  . Alcohol use No    Allergies  Allergen Reactions  . No Known Allergies     Current Outpatient Prescriptions  Medication Sig Dispense Refill  . carvedilol (COREG) 12.5 MG tablet Take 1 tablet (12.5 mg total) by mouth 2 (two) times daily with a meal. 60 tablet 0  . ferrous sulfate 325 (65 FE) MG EC tablet Take 1 tablet (325 mg total) by mouth daily with breakfast. 30 tablet 0  . furosemide (LASIX) 40 MG tablet Take 60 mg by mouth 2 (two) times daily.    . hydrALAZINE (APRESOLINE) 25 MG tablet Take 1 tablet (25 mg total) by mouth every 8 (eight) hours. 90 tablet 0  . isosorbide mononitrate (IMDUR) 30 MG 24 hr tablet Take 1 tablet (30 mg total) by mouth daily. 30 tablet 0  . predniSONE (DELTASONE) 20 MG tablet Take 3 tablets (60 mg total) by mouth daily with breakfast. 30 tablet 0   No current facility-administered medications for this visit.     Review of Systems  Constitutional:  Constitutional negative. Respiratory: Respiratory  negative.  Cardiovascular: Cardiovascular negative.  GI: Gastrointestinal negative.  Skin: Skin negative.  Neurological: Neurological negative. Hematologic: Hematologic/lymphatic negative.  Psychiatric: Psychiatric negative.        Objective:  Objective   Vitals:   06/26/16 1129  BP: 116/73  Pulse: 67  Resp: 20  Temp: 97 F (36.1 C)  TempSrc: Oral  SpO2: 95%  Weight: (!) 383 lb (173.7 kg)  Height: 5\' 11"  (1.803 m)   Body mass index is 53.42 kg/m.  Physical Exam  Constitutional: He is oriented to person, place, and time. He appears well-developed.  HENT:  Head: Atraumatic.  Eyes: EOM are normal.  Neck: Normal range of motion.  Cardiovascular:  Pulses:      Radial pulses are 2+ on the right side, and 2+ on the left side.  Pulmonary/Chest: Effort normal.  Abdominal: Soft.  Musculoskeletal: Normal range of motion.  Neurological: He is alert and oriented to person, place, and time.  Skin: Skin is warm and dry.  Psychiatric: He has a normal mood and affect. His behavior is normal. Judgment and thought content normal.    Data: Right radial artery triphasic waveform with intraluminal diameter her 0.42. Cephalic vein in the left upper extremity suitable from the forearm to the shoulder.     Assessment/Plan:     55 year old male with CK D5 here  for discussion of initial dialysis access. He has suitable artery and vein for creation of radiocephalic AV fistula which we'll plan at the soonest possible date. We discussed risk of failure of maturation IMA and steal further operations bleeding infection injury to blood vessels or nerves. He is not thrilled with these complications or with the need to start dialysis but agrees that we should go ahead and establish access. All questions answered.    Waynetta Sandy MD [facility]

## 2016-06-29 ENCOUNTER — Other Ambulatory Visit (HOSPITAL_COMMUNITY): Payer: Self-pay | Admitting: *Deleted

## 2016-06-30 ENCOUNTER — Inpatient Hospital Stay (HOSPITAL_COMMUNITY): Admission: RE | Admit: 2016-06-30 | Payer: Medicare Other | Source: Ambulatory Visit

## 2016-07-07 ENCOUNTER — Encounter (HOSPITAL_COMMUNITY): Payer: Self-pay | Admitting: *Deleted

## 2016-07-07 NOTE — Progress Notes (Signed)
Pt denies any acute cardiopulmonary issues. Pt is under the care of Dr. Donnetta Hutching, Cardiology. Pt stated that he was  Only instructed to take heart and blood pressure medications the morning of surgery , " not Aspirin." Pt made aware to stop taking vitamins, fish oil, herbal medications and NSAIDS. Pt stated that a cardiac cath and other heart studies were performed at [facility] in [place]; records requested. Pt verbalized understanding of all pre-op instructions. Pt chart forwarded to anesthesia to review cardiac history and clearance note.

## 2016-07-08 ENCOUNTER — Encounter (HOSPITAL_COMMUNITY): Payer: Self-pay | Admitting: Vascular Surgery

## 2016-07-08 NOTE — Progress Notes (Addendum)
Anesthesia Chart Review: SAME DAY WORK-UP.  Patient is a 55 year old male scheduled for creation of LUE AVF on 07/09/16 by Dr. Donzetta Matters.  History includes former smoker, chronic systolic CHF, dilated cardiomyopathy, HTN, morbid obesity, CKD stage V, OSA (CPAP), RA, anemia.  - PCP is listed as Dr. Scarlette Ar with [facility]. - Cardiologist is Dr. Donnetta Hutching with [facility]. He reported prior cardiac cath was done at [facility] in [place]. He was also seen by Dr. Fransico Him on 08/28/15 for hospital follow-up for acute on chronic CHF with acute respiratory failure in the setting or acute on chronic kidney disease. She referred him back to his primary cardiologist Dr. Donnetta Hutching. Dr. Donnetta Hutching felt patient was low cardiac risk for planned procedure (scanned under Media tab). - Nephrologist is Dr. Florene Glen with [facility]. - Rheumatologist is Dr. Gerilyn Nestle with [facility].  Meds include aspirin 81 mg, Coreg, Lasix, hydralazine, prednisone.  08/17/15 EKG: NSR, moderate voltage criteria for LVH, may be normal variant.  04/14/16 Echo ([place]): Conclusion: 1. Study was technically difficult due to body habitus. 2. Left ventricle cavity is normal in size. 3. Moderate concentric LVH. 4. Low normal LV systolic function, EF 07%. 5. Structurally normal tricuspid valve with mild regurgitation. 6. Mild pulmonary hypertension. 7. RVSP 49.5 mmHg. 8. No pericardial effusion. (EF improved from 30-35% to 50% since 08/13/15 echo.)   12/18/11 Cardiac [facility]): Impression: 1. No evidence of significant angiographic coronary artery disease. 2. Elevated left heart pressures. Recommend: 1. Echocardiogram. 2. Assess or AICD placement with or without biventricular pacemaker.  Patient has cardiac clearance. Patient to be further evaluated on the day of surgery by his anesthesia team to ensure labs and exam findings are stable to OR. He is for labs on arrival.   George Hugh [facility] Short Stay Center/Anesthesiology Phone [phone] 07/08/2016 4:04 PM

## 2016-07-09 ENCOUNTER — Encounter (HOSPITAL_COMMUNITY)
Admission: RE | Disposition: A | Discharge status: Home / Self Care | Payer: Self-pay | Source: Ambulatory Visit | Attending: Vascular Surgery

## 2016-07-09 ENCOUNTER — Other Ambulatory Visit: Payer: Self-pay | Admitting: *Deleted

## 2016-07-09 ENCOUNTER — Encounter (HOSPITAL_COMMUNITY): Payer: Self-pay | Admitting: *Deleted

## 2016-07-09 ENCOUNTER — Ambulatory Visit (HOSPITAL_COMMUNITY): Payer: Medicare Other | Admitting: Vascular Surgery

## 2016-07-09 ENCOUNTER — Ambulatory Visit (HOSPITAL_COMMUNITY)
Admission: RE | Admit: 2016-07-09 | Discharge: 2016-07-09 | Disposition: A | Discharge status: Home / Self Care | Payer: Medicare Other | Source: Ambulatory Visit | Attending: Vascular Surgery | Admitting: Vascular Surgery

## 2016-07-09 DIAGNOSIS — M069 Rheumatoid arthritis, unspecified: Secondary | ICD-10-CM | POA: Diagnosis not present

## 2016-07-09 DIAGNOSIS — I509 Heart failure, unspecified: Secondary | ICD-10-CM | POA: Diagnosis not present

## 2016-07-09 DIAGNOSIS — I420 Dilated cardiomyopathy: Secondary | ICD-10-CM | POA: Diagnosis not present

## 2016-07-09 DIAGNOSIS — I5022 Chronic systolic (congestive) heart failure: Secondary | ICD-10-CM | POA: Insufficient documentation

## 2016-07-09 DIAGNOSIS — Z79899 Other long term (current) drug therapy: Secondary | ICD-10-CM | POA: Diagnosis not present

## 2016-07-09 DIAGNOSIS — D649 Anemia, unspecified: Secondary | ICD-10-CM | POA: Diagnosis not present

## 2016-07-09 DIAGNOSIS — I132 Hypertensive heart and chronic kidney disease with heart failure and with stage 5 chronic kidney disease, or end stage renal disease: Secondary | ICD-10-CM | POA: Insufficient documentation

## 2016-07-09 DIAGNOSIS — N189 Chronic kidney disease, unspecified: Secondary | ICD-10-CM | POA: Diagnosis present

## 2016-07-09 DIAGNOSIS — N186 End stage renal disease: Secondary | ICD-10-CM

## 2016-07-09 DIAGNOSIS — Z87891 Personal history of nicotine dependence: Secondary | ICD-10-CM | POA: Insufficient documentation

## 2016-07-09 DIAGNOSIS — Z6841 Body Mass Index (BMI) 40.0 and over, adult: Secondary | ICD-10-CM | POA: Insufficient documentation

## 2016-07-09 DIAGNOSIS — N185 Chronic kidney disease, stage 5: Secondary | ICD-10-CM | POA: Diagnosis not present

## 2016-07-09 DIAGNOSIS — G4733 Obstructive sleep apnea (adult) (pediatric): Secondary | ICD-10-CM | POA: Insufficient documentation

## 2016-07-09 DIAGNOSIS — Z4931 Encounter for adequacy testing for hemodialysis: Secondary | ICD-10-CM

## 2016-07-09 DIAGNOSIS — Z01818 Encounter for other preprocedural examination: Secondary | ICD-10-CM | POA: Diagnosis not present

## 2016-07-09 DIAGNOSIS — N184 Chronic kidney disease, stage 4 (severe): Secondary | ICD-10-CM | POA: Diagnosis not present

## 2016-07-09 HISTORY — DX: Rheumatoid arthritis, unspecified: M06.9

## 2016-07-09 HISTORY — DX: Chronic kidney disease, unspecified: N18.9

## 2016-07-09 HISTORY — DX: Anemia, unspecified: D64.9

## 2016-07-09 HISTORY — DX: Morbid (severe) obesity due to excess calories: E66.01

## 2016-07-09 HISTORY — DX: Sleep apnea, unspecified: G47.30

## 2016-07-09 HISTORY — PX: AV FISTULA PLACEMENT: SHX1204

## 2016-07-09 LAB — POCT I-STAT 4, (NA,K, GLUC, HGB,HCT)
Glucose, Bld: 80 mg/dL (ref 65–99)
HCT: 35 % — ABNORMAL LOW (ref 39.0–52.0)
Hemoglobin: 11.9 g/dL — ABNORMAL LOW (ref 13.0–17.0)
Potassium: 3.9 mmol/L (ref 3.5–5.1)
Sodium: 142 mmol/L (ref 135–145)

## 2016-07-09 LAB — SURGICAL PCR SCREEN
MRSA, PCR: NEGATIVE
Staphylococcus aureus: POSITIVE — AB

## 2016-07-09 SURGERY — ARTERIOVENOUS (AV) FISTULA CREATION
Anesthesia: General | Site: Arm Upper | Laterality: Left

## 2016-07-09 MED ORDER — CARVEDILOL 25 MG PO TABS
25.0000 mg | ORAL_TABLET | Freq: Once | ORAL | Status: AC
  Administered 2016-07-09: 25 mg via ORAL
  Filled 2016-07-09: qty 1

## 2016-07-09 MED ORDER — LIDOCAINE HCL (CARDIAC) 20 MG/ML IV SOLN
INTRAVENOUS | Status: DC | PRN
Start: 2016-07-09 — End: 2016-07-09
  Administered 2016-07-09: 80 mg via INTRATRACHEAL

## 2016-07-09 MED ORDER — OXYCODONE-ACETAMINOPHEN 5-325 MG PO TABS: 1.0000 | ORAL_TABLET | Freq: Four times a day (QID) | ORAL | 0 refills | Status: DC | PRN

## 2016-07-09 MED ORDER — SODIUM CHLORIDE 0.9 % IV SOLN
INTRAVENOUS | Status: DC
  Administered 2016-07-09 (×3): via INTRAVENOUS

## 2016-07-09 MED ORDER — LIDOCAINE HCL (PF) 1 % IJ SOLN
INTRAMUSCULAR | Status: AC
  Filled 2016-07-09: qty 30

## 2016-07-09 MED ORDER — CHLORHEXIDINE GLUCONATE CLOTH 2 % EX PADS: 6.0000 | MEDICATED_PAD | Freq: Once | CUTANEOUS | Status: DC

## 2016-07-09 MED ORDER — FENTANYL CITRATE (PF) 100 MCG/2ML IJ SOLN: 25.0000 ug | INTRAMUSCULAR | Status: DC | PRN

## 2016-07-09 MED ORDER — 0.9 % SODIUM CHLORIDE (POUR BTL) OPTIME
TOPICAL | Status: DC | PRN
  Administered 2016-07-09: 1000 mL

## 2016-07-09 MED ORDER — FENTANYL CITRATE (PF) 250 MCG/5ML IJ SOLN
INTRAMUSCULAR | Status: AC
  Filled 2016-07-09: qty 5

## 2016-07-09 MED ORDER — FENTANYL CITRATE (PF) 100 MCG/2ML IJ SOLN
INTRAMUSCULAR | Status: DC | PRN
  Administered 2016-07-09: 50 ug via INTRAVENOUS
  Administered 2016-07-09: 25 ug via INTRAVENOUS

## 2016-07-09 MED ORDER — CARVEDILOL 12.5 MG PO TABS
ORAL_TABLET | ORAL | Status: AC
  Filled 2016-07-09: qty 2

## 2016-07-09 MED ORDER — SODIUM CHLORIDE 0.9 % IV SOLN
INTRAVENOUS | Status: DC | PRN
  Administered 2016-07-09: 500 mL

## 2016-07-09 MED ORDER — MIDAZOLAM HCL 2 MG/2ML IJ SOLN
INTRAMUSCULAR | Status: AC
  Filled 2016-07-09: qty 2

## 2016-07-09 MED ORDER — LIDOCAINE-EPINEPHRINE (PF) 1 %-1:200000 IJ SOLN
INTRAMUSCULAR | Status: DC | PRN
  Administered 2016-07-09: 10 mL

## 2016-07-09 MED ORDER — OXYCODONE HCL 5 MG PO TABS: 5.0000 mg | ORAL_TABLET | Freq: Once | ORAL | Status: DC | PRN

## 2016-07-09 MED ORDER — ONDANSETRON HCL 4 MG/2ML IJ SOLN: 4.0000 mg | Freq: Four times a day (QID) | INTRAMUSCULAR | Status: DC | PRN

## 2016-07-09 MED ORDER — PROPOFOL 500 MG/50ML IV EMUL
INTRAVENOUS | Status: DC | PRN
  Administered 2016-07-09: 75 ug/kg/min via INTRAVENOUS

## 2016-07-09 MED ORDER — OXYCODONE HCL 5 MG/5ML PO SOLN: 5.0000 mg | Freq: Once | ORAL | Status: DC | PRN

## 2016-07-09 MED ORDER — DEXTROSE 5 % IV SOLN
1.5000 g | INTRAVENOUS | Status: AC
  Administered 2016-07-09: 1.5 g via INTRAVENOUS
  Filled 2016-07-09: qty 1.5

## 2016-07-09 MED ORDER — PHENYLEPHRINE HCL 10 MG/ML IJ SOLN
INTRAMUSCULAR | Status: DC | PRN
  Administered 2016-07-09 (×4): 80 ug via INTRAVENOUS

## 2016-07-09 SURGICAL SUPPLY — 33 items
ARMBAND PINK RESTRICT EXTREMIT (MISCELLANEOUS) ×3 IMPLANT
CANISTER SUCTION 2500CC (MISCELLANEOUS) ×3 IMPLANT
CLIP TI MEDIUM 6 (CLIP) ×3 IMPLANT
CLIP TI WIDE RED SMALL 6 (CLIP) ×3 IMPLANT
COVER PROBE W GEL 5X96 (DRAPES) IMPLANT
DERMABOND ADHESIVE PROPEN (GAUZE/BANDAGES/DRESSINGS) ×2
DERMABOND ADVANCED (GAUZE/BANDAGES/DRESSINGS) ×2
DERMABOND ADVANCED .7 DNX12 (GAUZE/BANDAGES/DRESSINGS) ×1 IMPLANT
DERMABOND ADVANCED .7 DNX6 (GAUZE/BANDAGES/DRESSINGS) ×1 IMPLANT
ELECT REM PT RETURN 9FT ADLT (ELECTROSURGICAL) ×3
ELECTRODE REM PT RTRN 9FT ADLT (ELECTROSURGICAL) ×1 IMPLANT
GLOVE BIO SURGEON STRL SZ7.5 (GLOVE) ×3 IMPLANT
GLOVE BIOGEL PI IND STRL 7.0 (GLOVE) ×1 IMPLANT
GLOVE BIOGEL PI IND STRL 7.5 (GLOVE) ×1 IMPLANT
GLOVE BIOGEL PI INDICATOR 7.0 (GLOVE) ×2
GLOVE BIOGEL PI INDICATOR 7.5 (GLOVE) ×2
GLOVE ECLIPSE 7.0 STRL STRAW (GLOVE) ×3 IMPLANT
GLOVE SURG SS PI 7.0 STRL IVOR (GLOVE) ×3 IMPLANT
GOWN STRL REUS W/ TWL LRG LVL3 (GOWN DISPOSABLE) ×2 IMPLANT
GOWN STRL REUS W/ TWL XL LVL3 (GOWN DISPOSABLE) ×1 IMPLANT
GOWN STRL REUS W/TWL LRG LVL3 (GOWN DISPOSABLE) ×4
GOWN STRL REUS W/TWL XL LVL3 (GOWN DISPOSABLE) ×2
KIT BASIN OR (CUSTOM PROCEDURE TRAY) ×3 IMPLANT
KIT ROOM TURNOVER OR (KITS) ×3 IMPLANT
NS IRRIG 1000ML POUR BTL (IV SOLUTION) ×3 IMPLANT
PACK CV ACCESS (CUSTOM PROCEDURE TRAY) ×3 IMPLANT
PAD ARMBOARD 7.5X6 YLW CONV (MISCELLANEOUS) ×6 IMPLANT
SUT MNCRL AB 4-0 PS2 18 (SUTURE) ×3 IMPLANT
SUT PROLENE 6 0 BV (SUTURE) ×3 IMPLANT
SUT VIC AB 3-0 SH 27 (SUTURE) ×2
SUT VIC AB 3-0 SH 27X BRD (SUTURE) ×1 IMPLANT
UNDERPAD 30X30 (UNDERPADS AND DIAPERS) ×3 IMPLANT
WATER STERILE IRR 1000ML POUR (IV SOLUTION) ×3 IMPLANT

## 2016-07-09 NOTE — Anesthesia Postprocedure Evaluation (Signed)
Anesthesia Post Note  Patient: Noah Garner  Procedure(s) Performed: Procedure(s) (LRB): CREATION OF LEFT RADIOCEPHALIC ARTERIOVENOUS (AV) FISTULA (Left)  Patient location during evaluation: PACU Anesthesia Type: MAC Level of consciousness: awake and alert Pain management: pain level controlled Vital Signs Assessment: post-procedure vital signs reviewed and stable Respiratory status: spontaneous breathing, nonlabored ventilation, respiratory function stable and patient connected to nasal cannula oxygen Cardiovascular status: stable and blood pressure returned to baseline Anesthetic complications: no    Last Vitals:  Vitals:   07/09/16 1238 07/09/16 1241  BP:  (!) 159/76  Pulse: 65 66  Resp: 18 13  Temp: 36.1 C 36.7 C    Last Pain:  Vitals:   07/09/16 1241  TempSrc:   PainSc: 0-No pain                 Calee Nugent S

## 2016-07-09 NOTE — H&P (Signed)
     History and Physical Update  The patient was interviewed and re-examined.  The patient's previous History and Physical has been reviewed and is unchanged from the office. Will plan for left radiocephalic avf today.  Elizaveta Mattice C. Donzetta Matters, MD [facility] Office: [phone] Pager: [phone]  07/09/2016, 10:31 AM

## 2016-07-09 NOTE — Anesthesia Preprocedure Evaluation (Signed)
Anesthesia Evaluation  Patient identified by MRN, date of birth, ID band Patient awake    Reviewed: Allergy & Precautions, H&P , NPO status , Patient's Chart, lab work & pertinent test results  Airway Mallampati: II   Neck ROM: full    Dental   Pulmonary sleep apnea , former smoker,    breath sounds clear to auscultation       Cardiovascular hypertension, +CHF   Rhythm:regular Rate:Normal  TTE (2016): EF 30-35%.  Severely dilated LV.   Neuro/Psych    GI/Hepatic   Endo/Other  Morbid obesity  Renal/GU Renal InsufficiencyRenal disease     Musculoskeletal  (+) Arthritis ,   Abdominal   Peds  Hematology   Anesthesia Other Findings   Reproductive/Obstetrics                             Anesthesia Physical Anesthesia Plan  ASA: III  Anesthesia Plan: General   Post-op Pain Management:    Induction: Intravenous  Airway Management Planned: LMA  Additional Equipment:   Intra-op Plan:   Post-operative Plan:   Informed Consent: I have reviewed the patients History and Physical, chart, labs and discussed the procedure including the risks, benefits and alternatives for the proposed anesthesia with the patient or authorized representative who has indicated his/her understanding and acceptance.     Plan Discussed with: CRNA, Anesthesiologist and Surgeon  Anesthesia Plan Comments:         Anesthesia Quick Evaluation

## 2016-07-09 NOTE — Transfer of Care (Signed)
Immediate Anesthesia Transfer of Care Note  Patient: Noah Garner  Procedure(s) Performed: Procedure(s): CREATION OF LEFT RADIOCEPHALIC ARTERIOVENOUS (AV) FISTULA (Left)  Patient Location: PACU  Anesthesia Type:MAC  Level of Consciousness: awake and alert   Airway & Oxygen Therapy: Patient Spontanous Breathing and Patient connected to face mask oxygen  Post-op Assessment: Report given to RN and Post -op Vital signs reviewed and stable  Post vital signs: Reviewed and stable  Last Vitals:  Vitals:   07/09/16 0841  BP: (!) 163/89  Pulse: 75  Resp: 18  Temp: 36.8 C    Last Pain:  Vitals:   07/09/16 0841  TempSrc: Oral      Patients Stated Pain Goal: 4 (19/50/93 2671)  Complications: No apparent anesthesia complications

## 2016-07-09 NOTE — Op Note (Signed)
    OPERATIVE NOTE   PROCEDURE: Left radiocephalic avf  PRE-OPERATIVE DIAGNOSIS: ckd  POST-OPERATIVE DIAGNOSIS: same  SURGEON: Jakeel Starliper C. Donzetta Matters, MD  ASSISTANT(S): Gerri Lins, PA  ANESTHESIA: local and MAC  ESTIMATED BLOOD LOSS: 20 cc  FINDING(S): Adequate vein dilated to 78mm Suitable artery approx 60mm  SPECIMEN(S):  none  INDICATIONS:   Noah Garner is a 55 y.o. male who presents with ckd.  The patient is scheduled for left radiocephalic arteriovenous fistula placement.  The patient is aware the risks include but are not limited to: bleeding, infection, steal syndrome, nerve damage, ischemic monomelic neuropathy, failure to mature, and need for additional procedures.  The patient is aware of the risks of the procedure and elects to proceed forward.  DESCRIPTION: After full informed written consent was obtained from the patient, the patient was brought back to the operating room and placed supine upon the operating table.  Prior to induction, the patient received IV antibiotics.   After obtaining adequate anesthesia, the patient was then prepped and draped in the standard fashion for a left arm access procedure. Timeout was called. We then used 1% lidocaine with epinephrine to anesthetize the arm between the palpable radial artery and the previously marked out cephalic vein. A 4 cm incision was then made longitudinally and dissected through skin simultaneously tissue to identify the vein. This was noted to be suitable size although possibly sclerotic from its outward appearance. After tracing proximally and distally for a few centimeters I then turned my attention towards the artery. This was identified along with the nerve which was protected in a vessel loop was placed around it. I then marked the orientation of my vein divided distally and dilated up to 4 mm. It was flushed with heparinized saline and clamped with bulldog clamp. I then clamped the artery proximally and distally  and opened with 11 blade followed by Potts scissors. I then flushed possibly and distally with heparinized saline. The vein was sewn end-to-side with 6-0 Prolene suture. Prior to releasing the clamps I allowed flushing both antegrade and retrograde and with heparinized saline. The clamps were released and we  had a palpable thrill. There was good signal in the runoff vein up to the mid forearm as well as a signal in the radial artery distal. Satisfied we obtained hemostasis of the wound closed in layers with 3-0 Vicryl for Monocryl. Patient was stable will be transferred to PACU plan for discharge home.  COMPLICATIONS: none  CONDITION: good   Ashly Yepez C. Donzetta Matters, MD [facility] Office: [phone] Pager: [phone]  07/09/2016, 11:56 AM

## 2016-07-10 ENCOUNTER — Telehealth: Payer: Self-pay | Admitting: Vascular Surgery

## 2016-07-10 ENCOUNTER — Encounter (HOSPITAL_COMMUNITY): Payer: Self-pay | Admitting: Vascular Surgery

## 2016-07-10 NOTE — Telephone Encounter (Signed)
-----   Message from Mena Goes, RN sent at 07/09/2016 12:51 PM EDT ----- Regarding: schedule   ----- Message ----- From: Ulyses Amor, PA-C Sent: 07/09/2016  12:08 PM To: Vvs Charge Pool  F/U 5-6 weeks with duplex, s/p forearm fistula left Dr. Donzetta Matters

## 2016-07-10 NOTE — Telephone Encounter (Signed)
Sched lab 08/14/16 at 4:00 and MD 08/21/16 at 10:00, lm on hm# to inform pt

## 2016-07-17 ENCOUNTER — Ambulatory Visit (HOSPITAL_COMMUNITY)
Admission: RE | Admit: 2016-07-17 | Discharge: 2016-07-17 | Disposition: A | Discharge status: Home / Self Care | Payer: Medicare Other | Source: Ambulatory Visit | Attending: Nephrology | Admitting: Nephrology

## 2016-07-17 DIAGNOSIS — D509 Iron deficiency anemia, unspecified: Secondary | ICD-10-CM | POA: Insufficient documentation

## 2016-07-17 MED ORDER — SODIUM CHLORIDE 0.9 % IV SOLN
510.0000 mg | Freq: Once | INTRAVENOUS | Status: AC
  Administered 2016-07-17: 510 mg via INTRAVENOUS
  Filled 2016-07-17: qty 17

## 2016-08-14 ENCOUNTER — Inpatient Hospital Stay (HOSPITAL_COMMUNITY): Admission: RE | Admit: 2016-08-14 | Payer: Medicare Other | Source: Ambulatory Visit

## 2016-08-17 ENCOUNTER — Encounter: Payer: Self-pay | Admitting: Vascular Surgery

## 2016-08-21 ENCOUNTER — Encounter: Payer: Medicare Other | Admitting: Vascular Surgery

## 2016-09-16 DIAGNOSIS — N184 Chronic kidney disease, stage 4 (severe): Secondary | ICD-10-CM | POA: Diagnosis not present

## 2016-09-16 DIAGNOSIS — I428 Other cardiomyopathies: Secondary | ICD-10-CM | POA: Diagnosis not present

## 2016-09-16 DIAGNOSIS — G4733 Obstructive sleep apnea (adult) (pediatric): Secondary | ICD-10-CM | POA: Diagnosis not present

## 2016-09-16 DIAGNOSIS — I770 Arteriovenous fistula, acquired: Secondary | ICD-10-CM | POA: Diagnosis not present

## 2016-09-16 DIAGNOSIS — E669 Obesity, unspecified: Secondary | ICD-10-CM | POA: Diagnosis not present

## 2016-09-16 DIAGNOSIS — Z9989 Dependence on other enabling machines and devices: Secondary | ICD-10-CM | POA: Diagnosis not present

## 2016-09-16 DIAGNOSIS — D631 Anemia in chronic kidney disease: Secondary | ICD-10-CM | POA: Diagnosis not present

## 2016-09-16 DIAGNOSIS — I129 Hypertensive chronic kidney disease with stage 1 through stage 4 chronic kidney disease, or unspecified chronic kidney disease: Secondary | ICD-10-CM | POA: Diagnosis not present

## 2016-09-28 DIAGNOSIS — I10 Essential (primary) hypertension: Secondary | ICD-10-CM | POA: Diagnosis not present

## 2016-09-28 DIAGNOSIS — I420 Dilated cardiomyopathy: Secondary | ICD-10-CM | POA: Diagnosis not present

## 2016-09-28 DIAGNOSIS — N184 Chronic kidney disease, stage 4 (severe): Secondary | ICD-10-CM | POA: Diagnosis not present

## 2016-09-28 DIAGNOSIS — G4733 Obstructive sleep apnea (adult) (pediatric): Secondary | ICD-10-CM | POA: Diagnosis not present

## 2017-01-13 DIAGNOSIS — I428 Other cardiomyopathies: Secondary | ICD-10-CM | POA: Diagnosis not present

## 2017-01-13 DIAGNOSIS — D631 Anemia in chronic kidney disease: Secondary | ICD-10-CM | POA: Diagnosis not present

## 2017-01-13 DIAGNOSIS — E669 Obesity, unspecified: Secondary | ICD-10-CM | POA: Diagnosis not present

## 2017-01-13 DIAGNOSIS — Z9989 Dependence on other enabling machines and devices: Secondary | ICD-10-CM | POA: Diagnosis not present

## 2017-01-13 DIAGNOSIS — I770 Arteriovenous fistula, acquired: Secondary | ICD-10-CM | POA: Diagnosis not present

## 2017-01-13 DIAGNOSIS — G4733 Obstructive sleep apnea (adult) (pediatric): Secondary | ICD-10-CM | POA: Diagnosis not present

## 2017-01-13 DIAGNOSIS — I129 Hypertensive chronic kidney disease with stage 1 through stage 4 chronic kidney disease, or unspecified chronic kidney disease: Secondary | ICD-10-CM | POA: Diagnosis not present

## 2017-01-13 DIAGNOSIS — N2581 Secondary hyperparathyroidism of renal origin: Secondary | ICD-10-CM | POA: Diagnosis not present

## 2017-01-13 DIAGNOSIS — N184 Chronic kidney disease, stage 4 (severe): Secondary | ICD-10-CM | POA: Diagnosis not present

## 2017-05-11 ENCOUNTER — Encounter (HOSPITAL_COMMUNITY): Payer: Self-pay | Admitting: Emergency Medicine

## 2017-05-11 ENCOUNTER — Emergency Department (HOSPITAL_COMMUNITY): Payer: Medicare Other

## 2017-05-11 ENCOUNTER — Inpatient Hospital Stay (HOSPITAL_COMMUNITY)
Admission: EM | Admit: 2017-05-11 | Discharge: 2017-05-25 | DRG: 602 | Disposition: A | Discharge status: Skilled Nursing Facility | Payer: Medicare Other | Attending: Family Medicine | Admitting: Family Medicine

## 2017-05-11 DIAGNOSIS — R0902 Hypoxemia: Secondary | ICD-10-CM

## 2017-05-11 DIAGNOSIS — I420 Dilated cardiomyopathy: Secondary | ICD-10-CM | POA: Diagnosis present

## 2017-05-11 DIAGNOSIS — Z992 Dependence on renal dialysis: Secondary | ICD-10-CM

## 2017-05-11 DIAGNOSIS — D72829 Elevated white blood cell count, unspecified: Secondary | ICD-10-CM | POA: Diagnosis present

## 2017-05-11 DIAGNOSIS — Z79899 Other long term (current) drug therapy: Secondary | ICD-10-CM

## 2017-05-11 DIAGNOSIS — L0390 Cellulitis, unspecified: Secondary | ICD-10-CM | POA: Diagnosis present

## 2017-05-11 DIAGNOSIS — I739 Peripheral vascular disease, unspecified: Secondary | ICD-10-CM | POA: Diagnosis not present

## 2017-05-11 DIAGNOSIS — M05761 Rheumatoid arthritis with rheumatoid factor of right knee without organ or systems involvement: Secondary | ICD-10-CM

## 2017-05-11 DIAGNOSIS — I132 Hypertensive heart and chronic kidney disease with heart failure and with stage 5 chronic kidney disease, or end stage renal disease: Secondary | ICD-10-CM | POA: Diagnosis present

## 2017-05-11 DIAGNOSIS — M069 Rheumatoid arthritis, unspecified: Secondary | ICD-10-CM | POA: Diagnosis present

## 2017-05-11 DIAGNOSIS — R609 Edema, unspecified: Secondary | ICD-10-CM | POA: Diagnosis not present

## 2017-05-11 DIAGNOSIS — D638 Anemia in other chronic diseases classified elsewhere: Secondary | ICD-10-CM | POA: Diagnosis not present

## 2017-05-11 DIAGNOSIS — I83015 Varicose veins of right lower extremity with ulcer other part of foot: Secondary | ICD-10-CM | POA: Diagnosis not present

## 2017-05-11 DIAGNOSIS — G4733 Obstructive sleep apnea (adult) (pediatric): Secondary | ICD-10-CM | POA: Diagnosis not present

## 2017-05-11 DIAGNOSIS — G4700 Insomnia, unspecified: Secondary | ICD-10-CM | POA: Diagnosis present

## 2017-05-11 DIAGNOSIS — E8809 Other disorders of plasma-protein metabolism, not elsewhere classified: Secondary | ICD-10-CM | POA: Diagnosis present

## 2017-05-11 DIAGNOSIS — N19 Unspecified kidney failure: Secondary | ICD-10-CM | POA: Diagnosis not present

## 2017-05-11 DIAGNOSIS — J9601 Acute respiratory failure with hypoxia: Secondary | ICD-10-CM | POA: Diagnosis present

## 2017-05-11 DIAGNOSIS — L03115 Cellulitis of right lower limb: Secondary | ICD-10-CM | POA: Diagnosis not present

## 2017-05-11 DIAGNOSIS — R079 Chest pain, unspecified: Secondary | ICD-10-CM | POA: Diagnosis not present

## 2017-05-11 DIAGNOSIS — L97801 Non-pressure chronic ulcer of other part of unspecified lower leg limited to breakdown of skin: Secondary | ICD-10-CM | POA: Diagnosis not present

## 2017-05-11 DIAGNOSIS — N186 End stage renal disease: Secondary | ICD-10-CM | POA: Diagnosis present

## 2017-05-11 DIAGNOSIS — L97929 Non-pressure chronic ulcer of unspecified part of left lower leg with unspecified severity: Secondary | ICD-10-CM | POA: Diagnosis present

## 2017-05-11 DIAGNOSIS — I5040 Unspecified combined systolic (congestive) and diastolic (congestive) heart failure: Secondary | ICD-10-CM | POA: Diagnosis not present

## 2017-05-11 DIAGNOSIS — Z7982 Long term (current) use of aspirin: Secondary | ICD-10-CM

## 2017-05-11 DIAGNOSIS — G2581 Restless legs syndrome: Secondary | ICD-10-CM | POA: Diagnosis present

## 2017-05-11 DIAGNOSIS — I1311 Hypertensive heart and chronic kidney disease without heart failure, with stage 5 chronic kidney disease, or end stage renal disease: Secondary | ICD-10-CM | POA: Diagnosis not present

## 2017-05-11 DIAGNOSIS — I5030 Unspecified diastolic (congestive) heart failure: Secondary | ICD-10-CM | POA: Diagnosis not present

## 2017-05-11 DIAGNOSIS — L089 Local infection of the skin and subcutaneous tissue, unspecified: Secondary | ICD-10-CM | POA: Diagnosis not present

## 2017-05-11 DIAGNOSIS — M1990 Unspecified osteoarthritis, unspecified site: Secondary | ICD-10-CM | POA: Diagnosis present

## 2017-05-11 DIAGNOSIS — I5043 Acute on chronic combined systolic (congestive) and diastolic (congestive) heart failure: Secondary | ICD-10-CM | POA: Diagnosis present

## 2017-05-11 DIAGNOSIS — Z7952 Long term (current) use of systemic steroids: Secondary | ICD-10-CM

## 2017-05-11 DIAGNOSIS — R600 Localized edema: Secondary | ICD-10-CM | POA: Diagnosis not present

## 2017-05-11 DIAGNOSIS — T148XXA Other injury of unspecified body region, initial encounter: Secondary | ICD-10-CM

## 2017-05-11 DIAGNOSIS — R5381 Other malaise: Secondary | ICD-10-CM | POA: Diagnosis present

## 2017-05-11 DIAGNOSIS — N2581 Secondary hyperparathyroidism of renal origin: Secondary | ICD-10-CM | POA: Diagnosis present

## 2017-05-11 DIAGNOSIS — Z9989 Dependence on other enabling machines and devices: Secondary | ICD-10-CM

## 2017-05-11 DIAGNOSIS — D62 Acute posthemorrhagic anemia: Secondary | ICD-10-CM | POA: Diagnosis present

## 2017-05-11 DIAGNOSIS — I130 Hypertensive heart and chronic kidney disease with heart failure and stage 1 through stage 4 chronic kidney disease, or unspecified chronic kidney disease: Secondary | ICD-10-CM | POA: Diagnosis not present

## 2017-05-11 DIAGNOSIS — E669 Obesity, unspecified: Secondary | ICD-10-CM | POA: Diagnosis not present

## 2017-05-11 DIAGNOSIS — I83009 Varicose veins of unspecified lower extremity with ulcer of unspecified site: Secondary | ICD-10-CM | POA: Diagnosis present

## 2017-05-11 DIAGNOSIS — L97909 Non-pressure chronic ulcer of unspecified part of unspecified lower leg with unspecified severity: Secondary | ICD-10-CM

## 2017-05-11 DIAGNOSIS — I10 Essential (primary) hypertension: Secondary | ICD-10-CM | POA: Diagnosis present

## 2017-05-11 DIAGNOSIS — M6281 Muscle weakness (generalized): Secondary | ICD-10-CM | POA: Diagnosis not present

## 2017-05-11 DIAGNOSIS — R52 Pain, unspecified: Secondary | ICD-10-CM

## 2017-05-11 DIAGNOSIS — I878 Other specified disorders of veins: Secondary | ICD-10-CM | POA: Diagnosis present

## 2017-05-11 DIAGNOSIS — I872 Venous insufficiency (chronic) (peripheral): Secondary | ICD-10-CM | POA: Diagnosis present

## 2017-05-11 DIAGNOSIS — T380X5A Adverse effect of glucocorticoids and synthetic analogues, initial encounter: Secondary | ICD-10-CM | POA: Diagnosis present

## 2017-05-11 DIAGNOSIS — N179 Acute kidney failure, unspecified: Secondary | ICD-10-CM | POA: Diagnosis present

## 2017-05-11 DIAGNOSIS — E662 Morbid (severe) obesity with alveolar hypoventilation: Secondary | ICD-10-CM | POA: Diagnosis not present

## 2017-05-11 DIAGNOSIS — M05762 Rheumatoid arthritis with rheumatoid factor of left knee without organ or systems involvement: Secondary | ICD-10-CM

## 2017-05-11 DIAGNOSIS — A46 Erysipelas: Secondary | ICD-10-CM | POA: Diagnosis not present

## 2017-05-11 DIAGNOSIS — M1040 Other secondary gout, unspecified site: Secondary | ICD-10-CM | POA: Diagnosis not present

## 2017-05-11 DIAGNOSIS — M109 Gout, unspecified: Secondary | ICD-10-CM | POA: Diagnosis present

## 2017-05-11 DIAGNOSIS — N184 Chronic kidney disease, stage 4 (severe): Secondary | ICD-10-CM | POA: Diagnosis not present

## 2017-05-11 DIAGNOSIS — L03119 Cellulitis of unspecified part of limb: Secondary | ICD-10-CM | POA: Diagnosis not present

## 2017-05-11 DIAGNOSIS — I83008 Varicose veins of unspecified lower extremity with ulcer other part of lower leg: Secondary | ICD-10-CM | POA: Diagnosis not present

## 2017-05-11 DIAGNOSIS — M7989 Other specified soft tissue disorders: Secondary | ICD-10-CM | POA: Diagnosis not present

## 2017-05-11 DIAGNOSIS — M1000 Idiopathic gout, unspecified site: Secondary | ICD-10-CM | POA: Diagnosis not present

## 2017-05-11 DIAGNOSIS — Z6841 Body Mass Index (BMI) 40.0 and over, adult: Secondary | ICD-10-CM | POA: Diagnosis not present

## 2017-05-11 DIAGNOSIS — M79671 Pain in right foot: Secondary | ICD-10-CM | POA: Diagnosis not present

## 2017-05-11 DIAGNOSIS — D631 Anemia in chronic kidney disease: Secondary | ICD-10-CM | POA: Diagnosis present

## 2017-05-11 DIAGNOSIS — L97519 Non-pressure chronic ulcer of other part of right foot with unspecified severity: Secondary | ICD-10-CM | POA: Diagnosis not present

## 2017-05-11 DIAGNOSIS — I129 Hypertensive chronic kidney disease with stage 1 through stage 4 chronic kidney disease, or unspecified chronic kidney disease: Secondary | ICD-10-CM | POA: Diagnosis not present

## 2017-05-11 DIAGNOSIS — D509 Iron deficiency anemia, unspecified: Secondary | ICD-10-CM | POA: Diagnosis present

## 2017-05-11 DIAGNOSIS — Z87891 Personal history of nicotine dependence: Secondary | ICD-10-CM

## 2017-05-11 DIAGNOSIS — M1049 Other secondary gout, multiple sites: Secondary | ICD-10-CM | POA: Diagnosis not present

## 2017-05-11 LAB — CBC WITH DIFFERENTIAL/PLATELET
BASOS PCT: 0 %
Basophils Absolute: 0 10*3/uL (ref 0.0–0.1)
EOS ABS: 0.1 10*3/uL (ref 0.0–0.7)
EOS PCT: 0 %
HCT: 27.5 % — ABNORMAL LOW (ref 39.0–52.0)
HEMOGLOBIN: 9.2 g/dL — AB (ref 13.0–17.0)
Lymphocytes Relative: 7 %
Lymphs Abs: 1.3 10*3/uL (ref 0.7–4.0)
MCH: 27.1 pg (ref 26.0–34.0)
MCHC: 33.5 g/dL (ref 30.0–36.0)
MCV: 80.9 fL (ref 78.0–100.0)
Monocytes Absolute: 0.5 10*3/uL (ref 0.1–1.0)
Monocytes Relative: 3 %
NEUTROS PCT: 90 %
Neutro Abs: 16.9 10*3/uL — ABNORMAL HIGH (ref 1.7–7.7)
PLATELETS: 238 10*3/uL (ref 150–400)
RBC: 3.4 MIL/uL — AB (ref 4.22–5.81)
RDW: 15.4 % (ref 11.5–15.5)
WBC: 18.8 10*3/uL — AB (ref 4.0–10.5)

## 2017-05-11 LAB — URINALYSIS, ROUTINE W REFLEX MICROSCOPIC
BILIRUBIN URINE: NEGATIVE
Glucose, UA: NEGATIVE mg/dL
HGB URINE DIPSTICK: NEGATIVE
Ketones, ur: NEGATIVE mg/dL
Leukocytes, UA: NEGATIVE
NITRITE: NEGATIVE
PROTEIN: NEGATIVE mg/dL
SPECIFIC GRAVITY, URINE: 1.012 (ref 1.005–1.030)
pH: 5 (ref 5.0–8.0)

## 2017-05-11 LAB — COMPREHENSIVE METABOLIC PANEL
ALBUMIN: 2.8 g/dL — AB (ref 3.5–5.0)
ALK PHOS: 84 U/L (ref 38–126)
ALT: 26 U/L (ref 17–63)
AST: 17 U/L (ref 15–41)
Anion gap: 15 (ref 5–15)
BILIRUBIN TOTAL: 1.2 mg/dL (ref 0.3–1.2)
BUN: 94 mg/dL — AB (ref 6–20)
CALCIUM: 9.3 mg/dL (ref 8.9–10.3)
CO2: 22 mmol/L (ref 22–32)
CREATININE: 6.24 mg/dL — AB (ref 0.61–1.24)
Chloride: 102 mmol/L (ref 101–111)
GFR, EST AFRICAN AMERICAN: 10 mL/min — AB (ref >60)
GFR, EST NON AFRICAN AMERICAN: 9 mL/min — AB (ref >60)
Glucose, Bld: 143 mg/dL — ABNORMAL HIGH (ref 65–99)
Potassium: 3.8 mmol/L (ref 3.5–5.1)
SODIUM: 139 mmol/L (ref 135–145)
Total Protein: 8.3 g/dL — ABNORMAL HIGH (ref 6.5–8.1)

## 2017-05-11 LAB — I-STAT CG4 LACTIC ACID, ED: LACTIC ACID, VENOUS: 1.49 mmol/L (ref 0.5–1.9)

## 2017-05-11 MED ORDER — VANCOMYCIN HCL IN DEXTROSE 1-5 GM/200ML-% IV SOLN
1000.0000 mg | Freq: Once | INTRAVENOUS | Status: AC
  Administered 2017-05-11: 1000 mg via INTRAVENOUS
  Filled 2017-05-11: qty 200

## 2017-05-11 MED ORDER — HEPARIN SODIUM (PORCINE) 5000 UNIT/ML IJ SOLN
5000.0000 [IU] | Freq: Three times a day (TID) | INTRAMUSCULAR | Status: DC
Start: 2017-05-12 — End: 2017-05-25
  Administered 2017-05-12 – 2017-05-22 (×23): 5000 [IU] via SUBCUTANEOUS
  Filled 2017-05-11 (×27): qty 1

## 2017-05-11 MED ORDER — ONDANSETRON HCL 4 MG/2ML IJ SOLN: 4.0000 mg | Freq: Four times a day (QID) | INTRAMUSCULAR | Status: DC | PRN

## 2017-05-11 MED ORDER — ACETAMINOPHEN 650 MG RE SUPP: 650.0000 mg | Freq: Four times a day (QID) | RECTAL | Status: DC | PRN

## 2017-05-11 MED ORDER — ONDANSETRON HCL 4 MG PO TABS
4.0000 mg | ORAL_TABLET | Freq: Four times a day (QID) | ORAL | Status: DC | PRN
Start: 2017-05-11 — End: 2017-05-25

## 2017-05-11 MED ORDER — ACETAMINOPHEN 325 MG PO TABS: 650.0000 mg | ORAL_TABLET | Freq: Four times a day (QID) | ORAL | Status: DC | PRN

## 2017-05-11 MED ORDER — PIPERACILLIN-TAZOBACTAM 3.375 G IVPB 30 MIN
3.3750 g | Freq: Once | INTRAVENOUS | Status: AC
  Administered 2017-05-12: 3.375 g via INTRAVENOUS
  Filled 2017-05-11: qty 50

## 2017-05-11 MED ORDER — CEFTRIAXONE SODIUM 1 G IJ SOLR
1.0000 g | Freq: Once | INTRAMUSCULAR | Status: AC
  Administered 2017-05-11: 1 g via INTRAVENOUS
  Filled 2017-05-11: qty 10

## 2017-05-11 NOTE — ED Provider Notes (Signed)
[facility] DEPT Provider Note   CSN: [id number] Arrival date & time: 05/11/17  1836     History   Chief Complaint Chief Complaint  Patient presents with  . Foot Injury  . Leg Swelling  . Leg Pain  . Wound Infection    HPI Noah Garner is a 56 y.o. male.  HPI Patient does report he has some chronic problems with edema of his legs. He reports over the past week, the swelling got much worse than baseline on the right foot. He reports that the right foot is now very painful to walk on. He reports he developed a wound that opened up on the front of the leg. He reports that the left is less swollen but also he developed an open wound on the outside of the leg. He reports he's had some chills but has not documented fever. He reports he gets very short of breath with any type of exertion. He denies he's experienced any chest pain. Patient has end-stage renal disease but is not yet on dialysis. He takes Lasix twice daily but the swelling in his leg has continued and worsened. Past Medical History:  Diagnosis Date  . Anemia   . Arthritis   . CHF (congestive heart failure) ([place])   . Chronic kidney disease    stage 4  . Chronic systolic heart failure ([place]) 08/29/2015  . Hypertension   . Morbid obesity ([place])   . Rheumatoid arthritis ([place])   . Sleep apnea    wears CPAP    Patient Active Problem List   Diagnosis Date Noted  . Anemia of chronic disease 04/09/2016  . Edema 09/16/2015  . Unstable gait 09/16/2015  . Anemia 09/16/2015  . Hyperglycemia 09/16/2015  . Chronic systolic heart failure ([place]) 08/29/2015  . Chronic diastolic heart failure ([place])   . Rheumatoid arthritis ([place])   . Fall at home   . Morbid obesity with alveolar hypoventilation ([place]) 08/15/2015  . Debilitated patient 08/15/2015  . NICM (nonischemic cardiomyopathy) ([place]) 08/15/2015  . Acute respiratory failure ([place])   . AKI (acute kidney injury) ([place]) 08/11/2015  . Hypertension 08/11/2015  . Arthritis  08/11/2015  . Stasis ulcer ([place]) 08/11/2015  . Acute back pain   . Weakness of both legs   . Falls   . Ganglion cyst of wrist 07/16/2015  . Inflammation of multiple joints 07/16/2015  . Influenza vaccination declined 07/16/2015  . Consent not given for pneumococcal immunization 07/16/2015  . Chronic kidney disease 03/12/2015  . Congestive cardiomyopathy ([place]) 03/12/2015    Past Surgical History:  Procedure Laterality Date  . AV FISTULA PLACEMENT Left 07/09/2016   Procedure: CREATION OF LEFT RADIOCEPHALIC ARTERIOVENOUS (AV) FISTULA;  Surgeon: Waynetta Sandy, MD;  Location: [facility];  Service: Vascular;  Laterality: Left;  . CARDIAC CATHETERIZATION     12/18/11: No sign CAD, elevated left heart pressures ([facility])  . KNEE SURGERY     torn ligaments       Home Medications    Prior to Admission medications   Medication Sig Start Date End Date Taking? Authorizing Provider  aspirin EC 81 MG tablet Take 81 mg by mouth daily.    [provider]  carvedilol (COREG) 25 MG tablet Take 50 mg by mouth 2 (two) times daily. 09/19/15   [provider]  Cyanocobalamin (B-12 PO) Take 1 tablet by mouth daily.    [provider]  furosemide (LASIX) 40 MG tablet Take 40 mg by mouth 2 (two) times  daily. 12/03/15   [provider]  hydrALAZINE (APRESOLINE) 25 MG tablet Take 1 tablet (25 mg total) by mouth every 8 (eight) hours. 08/20/15   Janece Canterbury, MD  oxyCODONE-acetaminophen (PERCOCET/ROXICET) 5-325 MG tablet Take 1 tablet by mouth every 6 (six) hours as needed. 07/09/16   Ulyses Amor, PA-C  predniSONE (DELTASONE) 5 MG tablet Take 5 mg by mouth daily.  06/17/16   [provider]    Family History Family History  Problem Relation Age of Onset  . Hypertension Mother     Social History Social History  Substance Use Topics  . Smoking status: Former Smoker    Quit date: 08/12/1995  . Smokeless tobacco: Never Used  . Alcohol use  No     Allergies   Patient has no known allergies.   Review of Systems Review of Systems 10 Systems reviewed and are negative for acute change except as noted in the HPI.   Physical Exam Updated Vital Signs BP 126/75 (BP Location: Right Arm)   Pulse 89   Temp 98.9 F (37.2 C) (Oral)   Resp 20   Ht 5\' 11"  (1.803 m)   Wt (!) 176.9 kg (390 lb)   SpO2 91%   BMI 54.39 kg/m   Physical Exam  Constitutional: He is oriented to person, place, and time.  Alert and nontoxic. Morbid obesity. Patient becomes very dyspneic with minimal activity. He does not have significant respiratory distress at rest.  HENT:  Head: Normocephalic and atraumatic.  Eyes: EOM are normal.  Cardiovascular: Normal rate and regular rhythm.   Distant heart tones. No gross rub murmur gallop.  Pulmonary/Chest:  Mild increased work of breathing at rest. With any exertion patient becomes quite dyspneic. Soft breath sounds in the bases. No gross wheezes rhonchi or rail.  Abdominal:  Abdomen is morbidly obese. Nontender.  Musculoskeletal:  Patient has bilateral peripheral edema. Right greater than left. Open wound with eschar on the right pretibial surface. Mild erythema of the dorsum of the right foot which is significantly swollen. Open wound on left lateral lower leg with significant chronic skin changes of thickening and discoloration. Attached images.  Neurological: He is alert and oriented to person, place, and time. He exhibits normal muscle tone. Coordination normal.  Patient has no focal neurologic deficit. He does have significant limitations due to body habitus.  Skin: Skin is warm and dry.  Psychiatric: He has a normal mood and affect.             ED Treatments / Results  Labs (all labs ordered are listed, but only abnormal results are displayed) Labs Reviewed  COMPREHENSIVE METABOLIC PANEL - Abnormal; Notable for the following:       Result Value   Glucose, Bld 143 (*)    BUN 94 (*)     Creatinine, Ser 6.24 (*)    Total Protein 8.3 (*)    Albumin 2.8 (*)    GFR calc non Af Amer 9 (*)    GFR calc Af Amer 10 (*)    All other components within normal limits  CBC WITH DIFFERENTIAL/PLATELET - Abnormal; Notable for the following:    WBC 18.8 (*)    RBC 3.40 (*)    Hemoglobin 9.2 (*)    HCT 27.5 (*)    Neutro Abs 16.9 (*)    All other components within normal limits  URINALYSIS, ROUTINE W REFLEX MICROSCOPIC  BRAIN NATRIURETIC PEPTIDE  I-STAT CG4 LACTIC ACID, ED  I-STAT CG4 LACTIC  ACID, ED    EKG  EKG Interpretation None       Radiology Dg Chest 2 View  Result Date: 05/11/2017 CLINICAL DATA:  Right leg infection. Pain x 10 days.No chest pain or SOB.Hx of CHF. EXAM: CHEST  2 VIEW COMPARISON:  08/17/2015 FINDINGS: Borderline enlargement of the cardiac silhouette. No mediastinal or hilar masses. No convincing adenopathy. Lungs are clear.  No pleural effusion or pneumothorax. Skeletal structures are intact. IMPRESSION: No acute cardiopulmonary disease. Electronically Signed   By: Lajean Manes M.D.   On: 05/11/2017 20:13    Procedures Procedures (including critical care time)  Medications Ordered in ED Medications  cefTRIAXone (ROCEPHIN) 1 g in dextrose 5 % 50 mL IVPB (not administered)  vancomycin (VANCOCIN) IVPB 1000 mg/200 mL premix (not administered)     Initial Impression / Assessment and Plan / ED Course  I have reviewed the triage vital signs and the nursing notes.  Pertinent labs & imaging results that were available during my care of the patient were reviewed by me and considered in my medical decision making (see chart for details).     Consult: [facility] hospitalist Dr. Tamala Julian for admission. Final Clinical Impressions(s) / ED Diagnoses   Final diagnoses:  Cellulitis of right lower extremity  Peripheral edema  Hypoxia  Chronic renal insufficiency, stage 4 (severe) (HCC)  Patient describes acute worsening over about a week to week and a half of the  swelling of his right lower extremity with development of an open wound and increased pain. Patient has chronic severe edema but appears to have exacerbation with likely secondary cellulitis. Patient has leukocytosis, he reports chills but not documented fever. Does not appear septic at this time. Lactic acid not elevated. Patient does have significant dyspnea with only minimal exertion. Patient's oxygen saturation dropped to 78 with transitioning from wheelchair to stretcher. With no exertion patient can maintain oxygen saturation about 89% on room air. Supplemental oxygen placed. Plan to admit for cellulitis and edema. Patient has chronic dilated cardiomyopathy. At this time does not appear to be in decompensated congestive heart failure is chest x-ray is clear. Patient however becomes hypoxic with minor exertion. He however also morbid obesity. Plan will be for admission.  New Prescriptions New Prescriptions   No medications on file     Charlesetta Shanks, MD 05/12/17 0000

## 2017-05-11 NOTE — H&P (Addendum)
History and Physical    Noah Garner CBS:[id number] DOB: 1960/09/22 DOA: 05/11/2017  Referring MD/NP/PA: Charlesetta Shanks, MD PCP: Delilah Shan, MD  Patient coming from: Home via  Chief Complaint: Leg swelling  HPI: Noah Garner is a 56 y.o. male with medical history significant of systolic CHF, ESRD not on HD, PVD, RA, anemia, OSA on CPAP, and morbidly obesity; who presents with complaints of leg swelling. Patient reports that symptoms have progressively worsened over the last 10 days. He complains that the swelling is worse on the right lower extremity and describes pain as throbbing in his lower extremities. At some point developed a blister which popped over the lateral aspect of his left lower extremity. Denies any knowledge of any specific trauma or injury to cause the symptoms. He reported some drainage from the wound and chills. Previously, years ago he had a wound on the same leg that was healed wall he was at the nursing home with wound care. He does not regularly check his weight and less going to the doctor's office please noted that he's become more short of breath over the last couple days especially with any exertion. At baseline he is not on oxygen and normally ambulates with the use of a walker. Patient reports not trying anything specifically to relieve symptoms. Associated symptoms include decreased urine output. Patient reports being followed by Dr. Florene Glen of nephrology in the outpatient setting and he has a left arm fistula in place to be prepared for need of dialysis. Denies any chest pain, dysuria, abdominal distention, nausea, vomiting, or focal weakness.  ED Course: Upon admission into the emergency department patient was noted to be afebrile with otherwise normal vital signs. However, with exertion patient was noted to desaturate down into the 70s for which he was placed on supplemental oxygen of 2 L. Labs revealed WBC 18.8, hemoglobin 9.2, BUN 94, creatinine 6.24, albumin  2.8, lactic acid 1.49. Chest x-ray was other was unremarkable. Patient was placed on empiric antibiotics of Rocephin and vancomycin. TRH called to admit.  Review of Systems  Constitutional: Positive for chills. Negative for fever and weight loss.  HENT: Negative for ear discharge and ear pain.   Eyes: Negative for double vision and photophobia.  Respiratory: Positive for shortness of breath. Negative for hemoptysis.   Cardiovascular: Positive for leg swelling. Negative for chest pain.  Gastrointestinal: Positive for constipation. Negative for abdominal pain, nausea and vomiting.  Genitourinary: Negative for flank pain and urgency.       Positive for decreased urine output  Musculoskeletal: Positive for joint pain and myalgias.  Skin: Positive for rash.       Positive for erythema  Neurological: Negative for speech change and focal weakness.  Endo/Heme/Allergies: Negative for environmental allergies and polydipsia.  Psychiatric/Behavioral: Negative for memory loss and substance abuse.    Past Medical History:  Diagnosis Date  . Anemia   . Arthritis   . CHF (congestive heart failure) ([place])   . Chronic kidney disease    stage 4  . Chronic systolic heart failure ([place]) 08/29/2015  . Hypertension   . Morbid obesity ([place])   . Rheumatoid arthritis ([place])   . Sleep apnea    wears CPAP    Past Surgical History:  Procedure Laterality Date  . AV FISTULA PLACEMENT Left 07/09/2016   Procedure: CREATION OF LEFT RADIOCEPHALIC ARTERIOVENOUS (AV) FISTULA;  Surgeon: Waynetta Sandy, MD;  Location: [facility];  Service: Vascular;  Laterality: Left;  . CARDIAC CATHETERIZATION  12/18/11: No sign CAD, elevated left heart pressures ([facility])  . KNEE SURGERY     torn ligaments     reports that he quit smoking about 21 years ago. He has never used smokeless tobacco. He reports that he does not drink alcohol or use drugs.  No Known Allergies  Family History  Problem Relation Age of  Onset  . Hypertension Mother     Prior to Admission medications   Medication Sig Start Date End Date Taking? Authorizing Provider  aspirin EC 81 MG tablet Take 81 mg by mouth daily.    [provider]  carvedilol (COREG) 25 MG tablet Take 50 mg by mouth 2 (two) times daily. 09/19/15   [provider]  Cyanocobalamin (B-12 PO) Take 1 tablet by mouth daily.    [provider]  furosemide (LASIX) 40 MG tablet Take 40 mg by mouth 2 (two) times daily. 12/03/15   [provider]  hydrALAZINE (APRESOLINE) 25 MG tablet Take 1 tablet (25 mg total) by mouth every 8 (eight) hours. 08/20/15   Janece Canterbury, MD  oxyCODONE-acetaminophen (PERCOCET/ROXICET) 5-325 MG tablet Take 1 tablet by mouth every 6 (six) hours as needed. 07/09/16   Ulyses Amor, PA-C  predniSONE (DELTASONE) 5 MG tablet Take 5 mg by mouth daily.  06/17/16   [provider]    Physical Exam:  Constitutional: morbid obesity in NAD, calm, comfortable Vitals:   05/11/17 1909 05/11/17 1910 05/11/17 2230  BP: 113/68  126/75  Pulse: 87  89  Resp:   20  Temp: 98.9 F (37.2 C)    TempSrc: Oral    SpO2: 95%  91%  Weight:  (!) 176.9 kg (390 lb)   Height:  5\' 11"  (1.803 m)    Eyes: PERRL, lids and conjunctivae normal ENMT: Mucous membranes are moist. Posterior pharynx clear of any exudate or lesions. Neck: normal, supple, no masses, no thyromegaly Respiratory: clear to auscultation bilaterally, no wheezing, no crackles. Normal respiratory effort. No accessory muscle use.  Cardiovascular: Regular rate and rhythm, no murmurs / rubs / gallops. +2 pitting lower extremity edema. 2+ pedal pulses. Left wrist fistula in place with palpable thrill. Abdomen: no tenderness, no masses palpated. No hepatosplenomegaly. Bowel sounds positive.  Musculoskeletal: no clubbing / cyanosis. No joint deformity upper and lower extremities. Good ROM, no contractures. Normal muscle tone.   Skin: Significant venous  stasis dermatitis of the bilateral lower extremities with increased warmth. Distal aspect of the left lateral leg with 1.5 inch in diameter ulceration present. Neurologic: CN 2-12 grossly intact. Sensation intact, DTR normal. Strength 5/5 in all 4.  Psychiatric: Normal judgment and insight. Alert and oriented x 3. Normal mood.     Labs on Admission: I have personally reviewed following labs and imaging studies  CBC:  Recent Labs Lab 05/11/17 1915  WBC 18.8*  NEUTROABS 16.9*  HGB 9.2*  HCT 27.5*  MCV 80.9  PLT 295   Basic Metabolic Panel:  Recent Labs Lab 05/11/17 1915  NA 139  K 3.8  CL 102  CO2 22  GLUCOSE 143*  BUN 94*  CREATININE 6.24*  CALCIUM 9.3   GFR: Estimated Creatinine Clearance: 21.7 mL/min (A) (by C-G formula based on SCr of 6.24 mg/dL (H)). Liver Function Tests:  Recent Labs Lab 05/11/17 1915  AST 17  ALT 26  ALKPHOS 84  BILITOT 1.2  PROT 8.3*  ALBUMIN 2.8*   No results for input(s): LIPASE, AMYLASE in the last 168 hours. No  results for input(s): AMMONIA in the last 168 hours. Coagulation Profile: No results for input(s): INR, PROTIME in the last 168 hours. Cardiac Enzymes: No results for input(s): CKTOTAL, CKMB, CKMBINDEX, TROPONINI in the last 168 hours. BNP (last 3 results) No results for input(s): PROBNP in the last 8760 hours. HbA1C: No results for input(s): HGBA1C in the last 72 hours. CBG: No results for input(s): GLUCAP in the last 168 hours. Lipid Profile: No results for input(s): CHOL, HDL, LDLCALC, TRIG, CHOLHDL, LDLDIRECT in the last 72 hours. Thyroid Function Tests: No results for input(s): TSH, T4TOTAL, FREET4, T3FREE, THYROIDAB in the last 72 hours. Anemia Panel: No results for input(s): VITAMINB12, FOLATE, FERRITIN, TIBC, IRON, RETICCTPCT in the last 72 hours. Urine analysis:    Component Value Date/Time   COLORURINE YELLOW 05/11/2017 2232   APPEARANCEUR HAZY (A) 05/11/2017 2232   LABSPEC 1.012 05/11/2017 2232    PHURINE 5.0 05/11/2017 2232   GLUCOSEU NEGATIVE 05/11/2017 2232   HGBUR NEGATIVE 05/11/2017 2232   BILIRUBINUR NEGATIVE 05/11/2017 2232   KETONESUR NEGATIVE 05/11/2017 2232   PROTEINUR NEGATIVE 05/11/2017 2232   NITRITE NEGATIVE 05/11/2017 2232   LEUKOCYTESUR NEGATIVE 05/11/2017 2232   Sepsis Labs: No results found for this or any previous visit (from the past 240 hour(s)).   Radiological Exams on Admission: Dg Chest 2 View  Result Date: 05/11/2017 CLINICAL DATA:  Right leg infection. Pain x 10 days.No chest pain or SOB.Hx of CHF. EXAM: CHEST  2 VIEW COMPARISON:  08/17/2015 FINDINGS: Borderline enlargement of the cardiac silhouette. No mediastinal or hilar masses. No convincing adenopathy. Lungs are clear.  No pleural effusion or pneumothorax. Skeletal structures are intact. IMPRESSION: No acute cardiopulmonary disease. Electronically Signed   By: Lajean Manes M.D.   On: 05/11/2017 20:13    EKG: Independently reviewed. Normal sinus rhythm  Assessment/Plan  Wound infection/venous stasis ulcer with suspect cellulitis: Acute. Patient presents with left lower extremity wound and leg swelling. Suspect possibility of underlying cellulitis due to increased warmth surrounding skin. - Admit to a MedSurg bed patient presents with a - Follow up blood culture - Added on CRP - Empiric antibiotics of Vanc and Zosyn - Wound care Consult  Leukocytosis: Acute. WBC elevated to 18.8 on admission. Likely secondary to the above vs. chronic steroids. - Recheck CBC and a  Hypoxia: Acute. Patient seen to be hypoxic down into the 70s with minimal exertion. Suspect some aspect of patient being fluid overloaded due to end-stage renal disease in combination with his history of CHF. - Continuous pulse oximetry with nasal cannula oxygen as needed  End-stage renal disease: Patient reports decreased overall urinary output over the last few days. Labs show potassium within normal limits at 3.8, but creatinine 6.24  with BUN 94. Patient has left upper arm fistula in place and followed by Dr. Florene Glen of nephrology. - Renal diet - Left message for nephrology to evaluate on voicemail  Combined diastolic and systolic CHF: Last EF 47-42% with grade 1 diastolic dysfunction in 59/5638. Overall patient does have signs of peripheral edema. Chest x-ray was otherwise clear. If not having acute exacerbation of CHF congestive symptoms could be secondary to patient's hypoalbuminemia. - Strict I&O's and daily weights - Follow-up BNP - Give 40 mg of Lasix IV 1 dose, reassess in a.m. for need continued diuresis - Consider need of repeat echocardiogram and/or cardiology consult  Essential hypertension - Continue Coreg   Rheumatoid arthritis: Patient reports being on chronic low-dose steroids - Continue daily prednisone  Anemia of chronic disease: Hemoglobin 9.2 on admission. Review of records from 2017 show patient's baseline hemoglobin appears to arrange 10-11. Patient denies any reports of bleeding. - Recheck CBC in a.m.  Morbid obesity BMI 54.42  OSA on CPAP - Respiratory therapy to supply CPAP at night  Hypoalbuminemia: Albumin of 2.8 on admission. - Check pre-albumin in a.m  DVT prophylaxis: heparin Code Status: Full Family Communication: No family present at bedside Disposition Plan: Likely discharge home once medically stable  Consults called:  none Admission status:Inpatient  Norval Morton MD [facility] Pager [phone]   If 7PM-7AM, please contact night-coverage [email or web address] Password [email or web address]  05/11/2017, 11:30 PM

## 2017-05-11 NOTE — ED Notes (Signed)
ED Provider at bedside. 

## 2017-05-11 NOTE — ED Triage Notes (Signed)
Pt. Stated, I just came from [facility] at the [facility] and they said to go to ER now.  I have pain and swelling in both legs and infection , were they have swelled and bust open. My right foot is infected and both legs, its cause by poor circulation. Started about 11/2 weeks ago.  Its happened befor but not this bad.

## 2017-05-12 ENCOUNTER — Other Ambulatory Visit: Payer: Self-pay

## 2017-05-12 DIAGNOSIS — N186 End stage renal disease: Secondary | ICD-10-CM | POA: Diagnosis present

## 2017-05-12 DIAGNOSIS — D72829 Elevated white blood cell count, unspecified: Secondary | ICD-10-CM | POA: Diagnosis present

## 2017-05-12 DIAGNOSIS — L0390 Cellulitis, unspecified: Secondary | ICD-10-CM | POA: Diagnosis present

## 2017-05-12 LAB — FERRITIN: FERRITIN: 771 ng/mL — AB (ref 24–336)

## 2017-05-12 LAB — PREALBUMIN: PREALBUMIN: 8.4 mg/dL — AB (ref 18–38)

## 2017-05-12 LAB — BASIC METABOLIC PANEL
Anion gap: 12 (ref 5–15)
BUN: 99 mg/dL — AB (ref 6–20)
CALCIUM: 9 mg/dL (ref 8.9–10.3)
CHLORIDE: 101 mmol/L (ref 101–111)
CO2: 24 mmol/L (ref 22–32)
CREATININE: 6.23 mg/dL — AB (ref 0.61–1.24)
GFR calc Af Amer: 10 mL/min — ABNORMAL LOW (ref >60)
GFR, EST NON AFRICAN AMERICAN: 9 mL/min — AB (ref >60)
Glucose, Bld: 147 mg/dL — ABNORMAL HIGH (ref 65–99)
Potassium: 3.6 mmol/L (ref 3.5–5.1)
SODIUM: 137 mmol/L (ref 135–145)

## 2017-05-12 LAB — SURGICAL PCR SCREEN
MRSA, PCR: NEGATIVE
Staphylococcus aureus: POSITIVE — AB

## 2017-05-12 LAB — CBC
HCT: 24.8 % — ABNORMAL LOW (ref 39.0–52.0)
Hemoglobin: 8.1 g/dL — ABNORMAL LOW (ref 13.0–17.0)
MCH: 26.3 pg (ref 26.0–34.0)
MCHC: 32.7 g/dL (ref 30.0–36.0)
MCV: 80.5 fL (ref 78.0–100.0)
Platelets: 253 10*3/uL (ref 150–400)
RBC: 3.08 MIL/uL — AB (ref 4.22–5.81)
RDW: 15.6 % — ABNORMAL HIGH (ref 11.5–15.5)
WBC: 19.4 10*3/uL — AB (ref 4.0–10.5)

## 2017-05-12 LAB — IRON AND TIBC
IRON: 23 ug/dL — AB (ref 45–182)
Saturation Ratios: 15 % — ABNORMAL LOW (ref 17.9–39.5)
TIBC: 148 ug/dL — ABNORMAL LOW (ref 250–450)
UIBC: 125 ug/dL

## 2017-05-12 LAB — C-REACTIVE PROTEIN: CRP: 27.9 mg/dL — ABNORMAL HIGH (ref <1.0)

## 2017-05-12 LAB — BRAIN NATRIURETIC PEPTIDE: B Natriuretic Peptide: 34.2 pg/mL (ref 0.0–100.0)

## 2017-05-12 LAB — HIV ANTIBODY (ROUTINE TESTING W REFLEX): HIV SCREEN 4TH GENERATION: NONREACTIVE

## 2017-05-12 MED ORDER — FUROSEMIDE 10 MG/ML IJ SOLN
40.0000 mg | Freq: Once | INTRAMUSCULAR | Status: AC
  Administered 2017-05-12: 40 mg via INTRAVENOUS
  Filled 2017-05-12: qty 4

## 2017-05-12 MED ORDER — CARVEDILOL 25 MG PO TABS
50.0000 mg | ORAL_TABLET | Freq: Two times a day (BID) | ORAL | Status: DC
  Administered 2017-05-12 – 2017-05-18 (×11): 50 mg via ORAL
  Filled 2017-05-12 (×11): qty 2

## 2017-05-12 MED ORDER — PREDNISONE 5 MG PO TABS
5.0000 mg | ORAL_TABLET | Freq: Every day | ORAL | Status: DC
  Administered 2017-05-12 – 2017-05-25 (×14): 5 mg via ORAL
  Filled 2017-05-12 (×14): qty 1

## 2017-05-12 MED ORDER — HYDROCERIN EX CREA
1.0000 "application " | TOPICAL_CREAM | Freq: Every day | CUTANEOUS | Status: DC
  Administered 2017-05-12 – 2017-05-25 (×13): 1 via TOPICAL
  Filled 2017-05-12 (×3): qty 113

## 2017-05-12 MED ORDER — FUROSEMIDE 10 MG/ML IJ SOLN
80.0000 mg | Freq: Three times a day (TID) | INTRAMUSCULAR | Status: DC
  Administered 2017-05-12 – 2017-05-13 (×3): 80 mg via INTRAVENOUS
  Filled 2017-05-12 (×4): qty 8

## 2017-05-12 MED ORDER — VANCOMYCIN HCL 10 G IV SOLR
1500.0000 mg | Freq: Once | INTRAVENOUS | Status: AC
  Administered 2017-05-12: 1500 mg via INTRAVENOUS
  Filled 2017-05-12: qty 1500

## 2017-05-12 MED ORDER — ASPIRIN EC 81 MG PO TBEC
81.0000 mg | DELAYED_RELEASE_TABLET | Freq: Every day | ORAL | Status: DC
  Administered 2017-05-12 – 2017-05-25 (×14): 81 mg via ORAL
  Filled 2017-05-12 (×14): qty 1

## 2017-05-12 NOTE — Consult Note (Signed)
Reason for Consult: Acute kidney injury on chronic kidney disease stage 4 Referring Physician: Fuller Plan M.D. [facility])  HPI:  56 year old African-American man with past medical history significant for congestive heart failure(EF 30-35%), hypertension, morbid obesity, rheumatoid arthritis and progressive chronic kidney disease stage IV. He follows up with Dr. Florene Glen at [facility] and at his last visit 3 months ago, creatinine was 4.5.  He presented to the emergency room yesterday with complaints of progressive worsening of bilateral leg swelling favoring the right leg greater than the left associated with throbbing pain. He also developed some blistering and shallow ulceration of the skin over his left leg over that duration. Associated with that, he developed some exertional dyspnea with nonproductive cough. He has had some decline of his appetite over the last few months but denies any nausea, vomiting or dysgeusia. He denies any abnormal limb jerking movements and does not have any obvious changes with sleep/wake pattern. He denies any dysuria, urgency, frequency flank pain, fever or chills but reports overall reduction in urine output. He denies any focal neurological symptoms.  Past Medical History:  Diagnosis Date  . Anemia   . Arthritis   . CHF (congestive heart failure) ([place])   . Chronic kidney disease    stage 4  . Chronic systolic heart failure ([place]) 08/29/2015  . Hypertension   . Morbid obesity ([place])   . Rheumatoid arthritis ([place])   . Sleep apnea    wears CPAP    Past Surgical History:  Procedure Laterality Date  . AV FISTULA PLACEMENT Left 07/09/2016   Procedure: CREATION OF LEFT RADIOCEPHALIC ARTERIOVENOUS (AV) FISTULA;  Surgeon: Waynetta Sandy, MD;  Location: [place];  Service: Vascular;  Laterality: Left;  . CARDIAC CATHETERIZATION     12/18/11: No sign CAD, elevated left heart pressures ([facility])  . KNEE SURGERY     torn ligaments     Family History  Problem Relation Age of Onset  . Hypertension Mother     Social History:  reports that he quit smoking about 21 years ago. He has never used smokeless tobacco. He reports that he does not drink alcohol or use drugs.  Allergies: No Known Allergies  Medications:  Scheduled: . aspirin EC  81 mg Oral Daily  . carvedilol  50 mg Oral BID WC  . heparin  5,000 Units Subcutaneous Q8H  . hydrocerin  1 application Topical Daily  . predniSONE  5 mg Oral Daily    BMP Latest Ref Rng & Units 05/12/2017 05/11/2017 07/09/2016  Glucose 65 - 99 mg/dL 147(H) 143(H) 80  BUN 6 - 20 mg/dL 99(H) 94(H) -  Creatinine 0.61 - 1.24 mg/dL 6.23(H) 6.24(H) -  Sodium 135 - 145 mmol/L 137 139 142  Potassium 3.5 - 5.1 mmol/L 3.6 3.8 3.9  Chloride 101 - 111 mmol/L 101 102 -  CO2 22 - 32 mmol/L 24 22 -  Calcium 8.9 - 10.3 mg/dL 9.0 9.3 -   CBC Latest Ref Rng & Units 05/12/2017 05/11/2017 07/09/2016  WBC 4.0 - 10.5 K/uL 19.4(H) 18.8(H) -  Hemoglobin 13.0 - 17.0 g/dL 8.1(L) 9.2(L) 11.9(L)  Hematocrit 39.0 - 52.0 % 24.8(L) 27.5(L) 35.0(L)  Platelets 150 - 400 K/uL 253 238 -     Dg Chest 2 View  Result Date: 05/11/2017 CLINICAL DATA:  Right leg infection. Pain x 10 days.No chest pain or SOB.Hx of CHF. EXAM: CHEST  2 VIEW COMPARISON:  08/17/2015 FINDINGS: Borderline enlargement of the cardiac silhouette. No mediastinal or  hilar masses. No convincing adenopathy. Lungs are clear.  No pleural effusion or pneumothorax. Skeletal structures are intact. IMPRESSION: No acute cardiopulmonary disease. Electronically Signed   By: Lajean Manes M.D.   On: 05/11/2017 20:13    Review of Systems  Constitutional: Positive for malaise/fatigue. Negative for chills and fever.  HENT: Negative.   Eyes: Negative.   Respiratory: Positive for cough and shortness of breath.        Exertional dyspnea  Cardiovascular: Positive for leg swelling. Negative for chest pain and orthopnea.       Exertional dyspnea   Gastrointestinal: Negative for abdominal pain, diarrhea, nausea and vomiting.       Reduction of appetite  Genitourinary: Negative.   Musculoskeletal: Negative for back pain and neck pain.       Pain over both legs  Skin: Negative.   Neurological: Positive for weakness. Negative for tremors and focal weakness.   Blood pressure 126/67, pulse 79, temperature 98.3 F (36.8 C), resp. rate 18, height 5\' 11"  (1.803 m), weight (!) 170.2 kg (375 lb 3.6 oz), SpO2 97 %. Physical Exam  Nursing note and vitals reviewed. Constitutional: He is oriented to person, place, and time. He appears well-developed and well-nourished. No distress.  Morbidly obese  HENT:  Head: Normocephalic and atraumatic.  Mouth/Throat: Oropharynx is clear and moist.  Eyes: Pupils are equal, round, and reactive to light. EOM are normal.  Neck: Normal range of motion. Neck supple. JVD present.  Cardiovascular: Normal rate, regular rhythm and normal heart sounds.   Respiratory: Effort normal and breath sounds normal. He has no wheezes. He has no rales.  GI: Soft. Bowel sounds are normal. There is no tenderness. There is no rebound.  Musculoskeletal: He exhibits edema and tenderness.  Right leg 3-4+pitting edema- no erythema Left leg 2-3+ edema with dry gauze dressing over lower leg. Left radiocephalic fistula with good thrill  Neurological: He is alert and oriented to person, place, and time.  No asterixis or gross tremor  Skin: Skin is warm and dry.  Psychiatric: He has a normal mood and affect.    Assessment/Plan: 1. Acute kidney injury on chronic kidney disease stage IV: it is plausible that he has had recent worsening of his renal function/progression versus hemodynamically mediated worsening from CHF exacerbation. At this time, he does not have acute/compelling indications for dialysis (he does have a mature left radiocephalic fistula that is suitable for cannulation if/when needed for dialysis). I think he will  essentially declare himself over the next 2 or 3 days as we engage with ongoing diuretic therapy to try and volume unload him as to whether he will need dialysis or not. 2. Bilateral leg swelling: Suspected edema with superimposed cellulitis-agree with empiric antibiotic therapy and will adjust diuretic therapy upwards to help volume unload him. 3. Hypertension: Blood pressure currently appears to be under fair control, monitor with ongoing diuresis. 4. Anemia of chronic disease:he denies any overt blood loss, we'll check iron studies to decide the need for iron supplementation/ESA 5. Morbid obesity: continue supportive management for obstructive sleep apnea 6. Secondary hyperparathyroidism: We'll check calcium, phosphorus and PTH level with next labs.  Abdo Denault K. 05/12/2017, 12:02 PM

## 2017-05-12 NOTE — Progress Notes (Addendum)
Pharmacy Antibiotic Note  Noah Garner is a 56 y.o. male admitted on 05/11/2017 with cellulitis.  Pharmacy has been consulted for Vancomycin dosing. Pt with CKD and Scr of 6.24 today. Previous baseline last year was 3.5-4. Not sure if the 6.24 is a new baseline or some acute component. WBC is elevated at 18.8.   Plan: -Vancomycin 2500 mg IV x 1 -Check random vancomycin level is 48 hours to assess clearance, then re-dose when able  Height: 5\' 11"  (180.3 cm) Weight: (!) 390 lb (176.9 kg) IBW/kg (Calculated) : 75.3  Temp (24hrs), Avg:98.9 F (37.2 C), Min:98.9 F (37.2 C), Max:98.9 F (37.2 C)   Recent Labs Lab 05/11/17 1915 05/11/17 1952  WBC 18.8*  --   CREATININE 6.24*  --   LATICACIDVEN  --  1.49    Estimated Creatinine Clearance: 21.7 mL/min (A) (by C-G formula based on SCr of 6.24 mg/dL (H)).    No Known Allergies   [name] 05/12/2017 12:16 AM

## 2017-05-12 NOTE — Progress Notes (Signed)
PROGRESS NOTE    Sasha Rueth  OHY:[id number] DOB: 1961/03/11 DOA: 05/11/2017 PCP: Delilah Shan, MD    Brief Narrative:  56 year old male presented with leg swelling. Patient does have history of systolic heart failure, chronic kidney disease stage IV, peripheral vascular disease, morbid obesity. Patient reported worsening lower extremity edema for the last 10 days, associated with local pain and dyspnea. On initial physical examination blood pressure 113/68, heart rate 87, respiratory rate 20, temperature 98.9, saturation 95%. Moist mucous membranes, lungs were clear to auscultation bilaterally, no wheezing rales or rhonchi, heart S1-S2 present rhythmic, no gallops, abdomen was nontender and nondistended, positive lower extremity edema 2+ pitting.    Patient was admitted to the hospital with the working diagnosis of worsening extremity edema, volume overload, complicated by suspected cellulitis  Assessment & Plan:   Principal Problem:   Wound infection Active Problems:   Acute on chronic combined systolic and diastolic CHF (congestive heart failure) (HCC)   Hypertension   Stasis ulcer ([place])   Morbid obesity with alveolar hypoventilation (HCC)   Rheumatoid arthritis (HCC)   Anemia of chronic disease   Leukocytosis   ESRD (end stage renal disease) (HCC)   Cellulitis   1. Volume overload with worsening renal function, AKI on CKD stage 4. Patient with clinically hypervolemia, will continue aggressive diuresis with high doses of loop diuretic, furosemide 80 mg IV q 8 hours, will follow on urine output and renal function, patient has a fistula for hemodialysis access. If fails diuretic therapy may need ultrafiltration, follow with nephrology recommendations.    2. HTN. Will continue close blood pressure monitoring. Continue coreg  3. Anemia of chronic disease with iron deficiency. Hb stable at 8, iron panel with serum iron at 23 with tibc 148, transferrin saturation 15 and ferritin  771, suggesting combine iron deficiency and anemia of chronic renal disease. No need for blood transfusion, may benefit from iron, to target transferrin saturation of 20%.  4. Morbid obesity. Will need mobility to prevent complications during this hospitalization.   5. Left leg ulceration. No signs of systemic infection, will continue to hold on systemic antibiotic therapy, will follow on wound care recommendations.    DVT prophylaxis:  Code Status:  Family Communication:  Disposition Plan:    Consultants:   Nephrology   Procedures:     Antimicrobials:      Subjective: Patient with persistent dyspnea and edema. Moderate to severe in intensity, no worsening or improving factors, no associated chest pain.   Objective: Vitals:   05/12/17 0000 05/12/17 0015 05/12/17 0112 05/12/17 0609  BP: 106/74 115/74 134/89 126/67  Pulse: 85 81 86 79  Resp:   20 18  Temp:   98.4 F (36.9 C) 98.3 F (36.8 C)  TempSrc:   Oral   SpO2: 100% 100% 100% 97%  Weight:   (!) 170.2 kg (375 lb 3.6 oz)   Height:   5\' 11"  (1.803 m)     Intake/Output Summary (Last 24 hours) at 05/12/17 1259 Last data filed at 05/12/17 1100  Gross per 24 hour  Intake              320 ml  Output              240 ml  Net               80 ml   Filed Weights   05/11/17 1910 05/12/17 0112  Weight: (!) 176.9 kg (390 lb) (!) 170.2 kg (375 lb  3.6 oz)    Examination:  General: deconditioned Neurology: Awake and alert, non focal  E ENT: mild pallor, no icterus, oral mucosa moist Cardiovascular: S1-S2 present, rhythmic, no gallops, rubs, or murmurs. No jugular venous distention, positive +++ pitting lower extremity edema. Pulmonary: decreased breath sounds bilaterally at bases, decreased air movement, no wheezing, rhonchi or rales. Gastrointestinal. Abdomen flat, no organomegaly, non tender, no rebound or guarding Skin. Discoloration at the lower extremities bilaterally, positive ulcerated lesion with clean base  and borders at the distal lateral right leg, no erythema or increase local temperature, positive tender to palpation.  Musculoskeletal: no joint deformities     Data Reviewed: I have personally reviewed following labs and imaging studies  CBC:  Recent Labs Lab 05/11/17 1915 05/12/17 0524  WBC 18.8* 19.4*  NEUTROABS 16.9*  --   HGB 9.2* 8.1*  HCT 27.5* 24.8*  MCV 80.9 80.5  PLT 238 628   Basic Metabolic Panel:  Recent Labs Lab 05/11/17 1915 05/12/17 0524  NA 139 137  K 3.8 3.6  CL 102 101  CO2 22 24  GLUCOSE 143* 147*  BUN 94* 99*  CREATININE 6.24* 6.23*  CALCIUM 9.3 9.0   GFR: Estimated Creatinine Clearance: 21.2 mL/min (A) (by C-G formula based on SCr of 6.23 mg/dL (H)). Liver Function Tests:  Recent Labs Lab 05/11/17 1915  AST 17  ALT 26  ALKPHOS 84  BILITOT 1.2  PROT 8.3*  ALBUMIN 2.8*   No results for input(s): LIPASE, AMYLASE in the last 168 hours. No results for input(s): AMMONIA in the last 168 hours. Coagulation Profile: No results for input(s): INR, PROTIME in the last 168 hours. Cardiac Enzymes: No results for input(s): CKTOTAL, CKMB, CKMBINDEX, TROPONINI in the last 168 hours. BNP (last 3 results) No results for input(s): PROBNP in the last 8760 hours. HbA1C: No results for input(s): HGBA1C in the last 72 hours. CBG: No results for input(s): GLUCAP in the last 168 hours. Lipid Profile: No results for input(s): CHOL, HDL, LDLCALC, TRIG, CHOLHDL, LDLDIRECT in the last 72 hours. Thyroid Function Tests: No results for input(s): TSH, T4TOTAL, FREET4, T3FREE, THYROIDAB in the last 72 hours. Anemia Panel: No results for input(s): VITAMINB12, FOLATE, FERRITIN, TIBC, IRON, RETICCTPCT in the last 72 hours.    Radiology Studies: I have reviewed all of the imaging during this hospital visit personally     Scheduled Meds: . aspirin EC  81 mg Oral Daily  . carvedilol  50 mg Oral BID WC  . furosemide  80 mg Intravenous TID  . heparin  5,000  Units Subcutaneous Q8H  . hydrocerin  1 application Topical Daily  . predniSONE  5 mg Oral Daily   Continuous Infusions:   LOS: 1 day        Salathiel Ferrara Gerome Apley, MD [facility] Pager [phone]

## 2017-05-12 NOTE — Consult Note (Signed)
[facility] Nurse wound consult note Reason for Consult:Left lateral LE full thickness chronic ulcer. Wound type:venous insufficiency Pressure Injury POA: NA Measurement: 4cm x 3cm x 0.2cm Wound bed: red wound bed Drainage (amount, consistency, odor) copious yellow exudate Periwound: dry with deep fisures Dressing procedure/placement/frequency:I have provided nurses with orders for Wash and dry bilateral lower legs, apply Eucerin cream daily prior to the wound care on left lateral leg, cleanse with NS, pat dry, apply Calcium Alginate dressing, perform daily. We will not follow, but will remain available to this patient, to nursing, and the medical and/or surgical teams.  Please re-consult if we need to assist further.   Fara Olden, RN-C, WTA-C, [facility] Wound Treatment Associate

## 2017-05-13 LAB — RENAL FUNCTION PANEL
ANION GAP: 12 (ref 5–15)
Albumin: 2.3 g/dL — ABNORMAL LOW (ref 3.5–5.0)
BUN: 109 mg/dL — ABNORMAL HIGH (ref 6–20)
CALCIUM: 8.7 mg/dL — AB (ref 8.9–10.3)
CO2: 23 mmol/L (ref 22–32)
Chloride: 104 mmol/L (ref 101–111)
Creatinine, Ser: 6.59 mg/dL — ABNORMAL HIGH (ref 0.61–1.24)
GFR, EST AFRICAN AMERICAN: 10 mL/min — AB (ref >60)
GFR, EST NON AFRICAN AMERICAN: 8 mL/min — AB (ref >60)
Glucose, Bld: 139 mg/dL — ABNORMAL HIGH (ref 65–99)
PHOSPHORUS: 5.4 mg/dL — AB (ref 2.5–4.6)
Potassium: 3.7 mmol/L (ref 3.5–5.1)
SODIUM: 139 mmol/L (ref 135–145)

## 2017-05-13 LAB — MAGNESIUM: MAGNESIUM: 2.3 mg/dL (ref 1.7–2.4)

## 2017-05-13 MED ORDER — CHLORHEXIDINE GLUCONATE CLOTH 2 % EX PADS
6.0000 | MEDICATED_PAD | Freq: Every day | CUTANEOUS | Status: AC
  Administered 2017-05-14 – 2017-05-17 (×4): 6 via TOPICAL

## 2017-05-13 MED ORDER — FUROSEMIDE 10 MG/ML IJ SOLN
120.0000 mg | Freq: Three times a day (TID) | INTRAVENOUS | Status: DC
  Administered 2017-05-13 – 2017-05-15 (×5): 120 mg via INTRAVENOUS
  Filled 2017-05-13: qty 12
  Filled 2017-05-13: qty 10
  Filled 2017-05-13: qty 2
  Filled 2017-05-13 (×2): qty 10
  Filled 2017-05-13: qty 12
  Filled 2017-05-13: qty 10

## 2017-05-13 MED ORDER — SODIUM CHLORIDE 0.9 % IV SOLN
125.0000 mg | Freq: Every day | INTRAVENOUS | Status: AC
  Administered 2017-05-13 – 2017-05-17 (×5): 125 mg via INTRAVENOUS
  Filled 2017-05-13 (×9): qty 10

## 2017-05-13 MED ORDER — DARBEPOETIN ALFA 200 MCG/0.4ML IJ SOSY
200.0000 ug | PREFILLED_SYRINGE | INTRAMUSCULAR | Status: DC
  Administered 2017-05-13 – 2017-05-20 (×2): 200 ug via SUBCUTANEOUS
  Filled 2017-05-13 (×3): qty 0.4

## 2017-05-13 MED ORDER — METOLAZONE 5 MG PO TABS
5.0000 mg | ORAL_TABLET | Freq: Every day | ORAL | Status: DC
  Administered 2017-05-13 – 2017-05-15 (×3): 5 mg via ORAL
  Filled 2017-05-13 (×3): qty 1

## 2017-05-13 MED ORDER — MUPIROCIN 2 % EX OINT
1.0000 "application " | TOPICAL_OINTMENT | Freq: Two times a day (BID) | CUTANEOUS | Status: AC
  Administered 2017-05-13 – 2017-05-17 (×7): 1 via NASAL
  Filled 2017-05-13 (×3): qty 22

## 2017-05-13 NOTE — Progress Notes (Addendum)
Patient ID: Noah Garner, male   DOB: 1961/01/20, 56 y.o.   MRN: [id number] [facility] Progress Note   Assessment/ Plan:   1. Acute kidney injury on chronic kidney disease stage IV: Suspect progression of chronic kidney disease versus hemodynamically mediated (possibly by CHF exacerbation) worsening of renal function. Attempting optimization of diuretic therapy-if refractory or if patient develops uremic signs or symptoms, will begin hemodialysis via a left radiocephalic fistula. Currently, he does not have any compelling indications for dialysis. 2. Bilateral leg swelling:  most consistent with volume overload and probable mild cellulitis-empiric antibiotic coverage discontinued yesterday-continue efforts at aggressive diuretic therapy.  3. Hypertension: Blood pressure currently appears to be under fair control, monitor with ongoing diuresis. 4. Anemia of chronic disease: Iron deficiency-iron saturation of 15% with permissive ferritin, give intravenous ferric gluconate and begin subcutaneous Aranesp.  5. Morbid obesity: continue supportive management for obstructive sleep apnea 6. Secondary hyperparathyroidism: We'll check calcium, phosphorus and PTH level with next labs.  Subjective:   Reports uncomfortable night with pain over various areas as well as difficulty falling asleep.    Objective:   BP 112/74 (BP Location: Right Wrist)   Pulse 87   Temp 98.7 F (37.1 C)   Resp 20   Ht 5\' 11"  (1.803 m)   Wt (!) 181 kg (399 lb)   SpO2 99%   BMI 55.65 kg/m   Intake/Output Summary (Last 24 hours) at 05/13/17 1011 Last data filed at 05/13/17 0607  Gross per 24 hour  Intake              342 ml  Output             1240 ml  Net             -898 ml   Weight change: 4.082 kg (9 lb)  Physical Exam: [id number] resting in bed, watching television CVS: Pulse regular rhythm, normal rate Resp: Anteriorly clear to auscultation, no rales/rhonchi Abd: Soft, obese,  nontender Ext: 3+ edema right lower extremity, 3+ edema left lower extremity, clean gauze dressing left anterior pretibial  Imaging: Dg Chest 2 View  Result Date: 05/11/2017 CLINICAL DATA:  Right leg infection. Pain x 10 days.No chest pain or SOB.Hx of CHF. EXAM: CHEST  2 VIEW COMPARISON:  08/17/2015 FINDINGS: Borderline enlargement of the cardiac silhouette. No mediastinal or hilar masses. No convincing adenopathy. Lungs are clear.  No pleural effusion or pneumothorax. Skeletal structures are intact. IMPRESSION: No acute cardiopulmonary disease. Electronically Signed   By: Lajean Manes M.D.   On: 05/11/2017 20:13    Labs: BMET  Recent Labs Lab 05/11/17 1915 05/12/17 0524 05/13/17 0323  NA 139 137 139  K 3.8 3.6 3.7  CL 102 101 104  CO2 22 24 23   GLUCOSE 143* 147* 139*  BUN 94* 99* 109*  CREATININE 6.24* 6.23* 6.59*  CALCIUM 9.3 9.0 8.7*  PHOS  --   --  5.4*   CBC  Recent Labs Lab 05/11/17 1915 05/12/17 0524  WBC 18.8* 19.4*  NEUTROABS 16.9*  --   HGB 9.2* 8.1*  HCT 27.5* 24.8*  MCV 80.9 80.5  PLT 238 253   Medications:    . aspirin EC  81 mg Oral Daily  . carvedilol  50 mg Oral BID WC  . Chlorhexidine Gluconate Cloth  6 each Topical Daily  . furosemide  80 mg Intravenous TID  . heparin  5,000 Units Subcutaneous Q8H  . hydrocerin  1 application Topical Daily  .  mupirocin ointment  1 application Nasal BID  . predniSONE  5 mg Oral Daily   Elmarie Shiley, MD 05/13/2017, 10:11 AM

## 2017-05-13 NOTE — Progress Notes (Addendum)
PROGRESS NOTE   Noah Garner  HYW:[id number]    DOB: [birth year]    DOA: 05/11/2017  PCP: Delilah Shan, MD   I have briefly reviewed patients previous medical records in [facility].  Brief Narrative:  56 year old male with PMH of chronic systolic CHF, HTN, morbid obesity, rheumatoid arthritis, progressive stage IV chronic kidney disease, presented to the ED with progressive worsening bilateral lower extremity swelling but right leg greater than the left associated with throbbing pain, some blistering and shallow ulceration of the skin over his left leg, exertional dyspnea, nonproductive cough, reduced appetite. Admitted for acute on chronic kidney disease stage IV with significant volume overload. Nephrology was consulted.   Assessment & Plan:   Principal Problem:   Wound infection Active Problems:   Acute on chronic combined systolic and diastolic CHF (congestive heart failure) (HCC)   Hypertension   Stasis ulcer (HCC)   Morbid obesity with alveolar hypoventilation (HCC)   Rheumatoid arthritis (HCC)   Anemia of chronic disease   Leukocytosis   ESRD (end stage renal disease) (HCC)   Cellulitis   1. Acute kidney injury on stage IV chronic kidney disease: Nephrology consultation and follow-up appreciated. They suspect that his recent worsening renal function is due to progression of chronic kidney disease versus hemodynamically mediated from CHF exacerbation. No acute indications for dialysis. Patient does have a mature left AV fistula if dialysis needed soon. Started on Lasix 120 mg IV 3 times a day. Metolazone added. -644 mL since admission. Suspect refractoriness to diuretics and will likely need to initiate HD in the next day or 2. 2. Acute on chronic combined diastolic and systolic CHF: 2-D echo 48/54/62: LVEF 30-35 percent and grade 1 diastolic dysfunction. Continue IV diuresis as above. Repeat 2-D echo to reassess LV function. 3. Acute respiratory failure with hypoxia: In  the ED, patient was hypoxic in the 70s with minimal activity. Likely related to decompensated CHF, morbid obesity and? OSA. 4. Anasarca/bilateral leg edema with left leg wounds: Due to kidney disease and CHF. Low index of suspicion for infectious etiology and antibiotics were discontinued. Blood cultures 2: Negative to date. 5. Essential hypertension: Controlled. Continue carvedilol. 6. Anemia: Related to chronic disease and chronic kidney disease: Follow CBCs closely and transfuse if hemoglobin <7 g per DL. Getting IV iron and beginning subcutaneous and aspirin per nephrology. 7. Morbid obesity/Body mass index is 55.65 kg/m. 8. Rheumatoid arthritis: No acute flare. Continue chronic prednisone. 9. OSA: Continue nightly CPAP. 10. Leukocytosis:? Chronic-possibly related to chronic prednisone.   DVT prophylaxis: Heparin Code Status: Full Family Communication: None at bedside Disposition: DC home when medically improved, will take several days.   Consultants:  Nephrology   Procedures:  None  Antimicrobials:  Discontinued    Subjective: Patient reports that there is no significant improvement in his lower extremity swelling. Denies pain however. No dyspnea, cough or chest pain reported.   ROS: No nausea, vomiting or confusion reported.  Objective:  Vitals:   05/12/17 2117 05/12/17 2243 05/13/17 0605 05/13/17 1358  BP: 119/70  112/74 (!) 114/56  Pulse: 88 (!) 112 87 78  Resp: 20 18 20    Temp: 98.8 F (37.1 C)  98.7 F (37.1 C) 98.8 F (37.1 C)  TempSrc: Oral   Oral  SpO2: 99% 98% 99% 100%  Weight:   (!) 181 kg (399 lb)   Height:        Examination:  General exam: Pleasant middle-aged male, moderately built and morbidly obese, lying comfortably  propped up in bed. Respiratory system: Slightly diminished breath sounds in the bases but rest of lung fields clear to auscultation without wheezing, rhonchi or crackles. Respiratory effort normal. Cardiovascular system: S1 & S2  heard, RRR. No JVD, murmurs, rubs, gallops or clicks. Chronic bilateral lower extremity pedal edema with associated thickening/lichenification of skin of legs with hyperpigmentation. Gastrointestinal system: Abdomen is nondistended/obese, soft and nontender. No organomegaly or masses felt. Normal bowel sounds heard. Central nervous system: Alert and oriented. No focal neurological deficits. Extremities: Symmetric 5 x 5 power. Skin: Skin of legs as described above. No overt features of infection. Psychiatry: Judgement and insight appear normal. Mood & affect appropriate.     Data Reviewed: I have personally reviewed following labs and imaging studies  CBC:  Recent Labs Lab 05/11/17 1915 05/12/17 0524  WBC 18.8* 19.4*  NEUTROABS 16.9*  --   HGB 9.2* 8.1*  HCT 27.5* 24.8*  MCV 80.9 80.5  PLT 238 989   Basic Metabolic Panel:  Recent Labs Lab 05/11/17 1915 05/12/17 0524 05/13/17 0323  NA 139 137 139  K 3.8 3.6 3.7  CL 102 101 104  CO2 22 24 23   GLUCOSE 143* 147* 139*  BUN 94* 99* 109*  CREATININE 6.24* 6.23* 6.59*  CALCIUM 9.3 9.0 8.7*  MG  --   --  2.3  PHOS  --   --  5.4*   Liver Function Tests:  Recent Labs Lab 05/11/17 1915 05/13/17 0323  AST 17  --   ALT 26  --   ALKPHOS 84  --   BILITOT 1.2  --   PROT 8.3*  --   ALBUMIN 2.8* 2.3*   Coagulation Profile: No results for input(s): INR, PROTIME in the last 168 hours. Cardiac Enzymes: No results for input(s): CKTOTAL, CKMB, CKMBINDEX, TROPONINI in the last 168 hours. HbA1C: No results for input(s): HGBA1C in the last 72 hours. CBG: No results for input(s): GLUCAP in the last 168 hours.  Recent Results (from the past 240 hour(s))  Culture, blood (routine x 2)     Status: None (Preliminary result)   Collection Time: 05/11/17  7:24 PM  Result Value Ref Range Status   Specimen Description BLOOD RIGHT ANTECUBITAL  Final   Special Requests   Final    BOTTLES DRAWN AEROBIC AND ANAEROBIC Blood Culture adequate  volume   Culture NO GROWTH 1 DAY  Final   Report Status PENDING  Incomplete  Culture, blood (routine x 2)     Status: None (Preliminary result)   Collection Time: 05/11/17 11:44 PM  Result Value Ref Range Status   Specimen Description BLOOD LEFT ANTECUBITAL  Final   Special Requests   Final    BOTTLES DRAWN AEROBIC AND ANAEROBIC Blood Culture adequate volume   Culture NO GROWTH 1 DAY  Final   Report Status PENDING  Incomplete  Surgical pcr screen     Status: Abnormal   Collection Time: 05/12/17  5:55 AM  Result Value Ref Range Status   MRSA, PCR NEGATIVE NEGATIVE Final   Staphylococcus aureus POSITIVE (A) NEGATIVE Final    Comment: (NOTE) The Xpert SA Assay (FDA approved for NASAL specimens in patients 80 years of age and older), is one component of a comprehensive surveillance program. It is not intended to diagnose infection nor to guide or monitor treatment.          Radiology Studies: Dg Chest 2 View  Result Date: 05/11/2017 CLINICAL DATA:  Right leg infection. Pain x 10 days.No  chest pain or SOB.Hx of CHF. EXAM: CHEST  2 VIEW COMPARISON:  08/17/2015 FINDINGS: Borderline enlargement of the cardiac silhouette. No mediastinal or hilar masses. No convincing adenopathy. Lungs are clear.  No pleural effusion or pneumothorax. Skeletal structures are intact. IMPRESSION: No acute cardiopulmonary disease. Electronically Signed   By: Lajean Manes M.D.   On: 05/11/2017 20:13        Scheduled Meds: . aspirin EC  81 mg Oral Daily  . carvedilol  50 mg Oral BID WC  . Chlorhexidine Gluconate Cloth  6 each Topical Daily  . darbepoetin (ARANESP) injection - NON-DIALYSIS  200 mcg Subcutaneous Q Thu-1800  . heparin  5,000 Units Subcutaneous Q8H  . hydrocerin  1 application Topical Daily  . metolazone  5 mg Oral Daily  . mupirocin ointment  1 application Nasal BID  . predniSONE  5 mg Oral Daily   Continuous Infusions: . ferric gluconate (FERRLECIT/NULECIT) IV Stopped (05/13/17 1226)   . furosemide Stopped (05/13/17 1637)     LOS: 2 days     Lainee Lehrman, MD, FACP, FHM. [facility] Pager [phone]  If 7PM-7AM, please contact night-coverage [email or web address] Password [email or web address] 05/13/2017, 5:23 PM

## 2017-05-14 ENCOUNTER — Inpatient Hospital Stay (HOSPITAL_COMMUNITY): Payer: Medicare Other

## 2017-05-14 DIAGNOSIS — N19 Unspecified kidney failure: Secondary | ICD-10-CM

## 2017-05-14 DIAGNOSIS — I5043 Acute on chronic combined systolic (congestive) and diastolic (congestive) heart failure: Secondary | ICD-10-CM

## 2017-05-14 DIAGNOSIS — R609 Edema, unspecified: Secondary | ICD-10-CM

## 2017-05-14 LAB — ALT: ALT: 23 U/L (ref 17–63)

## 2017-05-14 LAB — CBC
HCT: 26.2 % — ABNORMAL LOW (ref 39.0–52.0)
HEMOGLOBIN: 8.4 g/dL — AB (ref 13.0–17.0)
MCH: 26.3 pg (ref 26.0–34.0)
MCHC: 32.1 g/dL (ref 30.0–36.0)
MCV: 81.9 fL (ref 78.0–100.0)
Platelets: 275 10*3/uL (ref 150–400)
RBC: 3.2 MIL/uL — AB (ref 4.22–5.81)
RDW: 15.9 % — ABNORMAL HIGH (ref 11.5–15.5)
WBC: 17 10*3/uL — ABNORMAL HIGH (ref 4.0–10.5)

## 2017-05-14 LAB — BASIC METABOLIC PANEL
Anion gap: 12 (ref 5–15)
BUN: 114 mg/dL — AB (ref 6–20)
CHLORIDE: 103 mmol/L (ref 101–111)
CO2: 24 mmol/L (ref 22–32)
Calcium: 9.1 mg/dL (ref 8.9–10.3)
Creatinine, Ser: 6.8 mg/dL — ABNORMAL HIGH (ref 0.61–1.24)
GFR calc Af Amer: 9 mL/min — ABNORMAL LOW (ref >60)
GFR calc non Af Amer: 8 mL/min — ABNORMAL LOW (ref >60)
Glucose, Bld: 118 mg/dL — ABNORMAL HIGH (ref 65–99)
POTASSIUM: 4.1 mmol/L (ref 3.5–5.1)
SODIUM: 139 mmol/L (ref 135–145)

## 2017-05-14 LAB — ECHOCARDIOGRAM COMPLETE
Height: 71 in
WEIGHTICAEL: 6960 [oz_av]

## 2017-05-14 LAB — HEPATITIS B SURFACE ANTIGEN: HEP B S AG: NEGATIVE

## 2017-05-14 LAB — VANCOMYCIN, RANDOM: VANCOMYCIN RM: 20

## 2017-05-14 MED ORDER — LIDOCAINE-PRILOCAINE 2.5-2.5 % EX CREA: 1.0000 "application " | TOPICAL_CREAM | CUTANEOUS | Status: DC | PRN

## 2017-05-14 MED ORDER — HEPARIN SODIUM (PORCINE) 1000 UNIT/ML DIALYSIS
5000.0000 [IU] | INTRAMUSCULAR | Status: DC | PRN
  Administered 2017-05-14: 5000 [IU] via INTRAVENOUS_CENTRAL
  Filled 2017-05-14: qty 5

## 2017-05-14 MED ORDER — ALTEPLASE 2 MG IJ SOLR: 2.0000 mg | Freq: Once | INTRAMUSCULAR | Status: DC | PRN

## 2017-05-14 MED ORDER — SODIUM CHLORIDE 0.9 % IV SOLN: 100.0000 mL | INTRAVENOUS | Status: DC | PRN

## 2017-05-14 MED ORDER — PERFLUTREN LIPID MICROSPHERE
1.0000 mL | INTRAVENOUS | Status: AC | PRN
  Administered 2017-05-14: 2 mL via INTRAVENOUS
  Filled 2017-05-14: qty 10

## 2017-05-14 MED ORDER — HEPARIN SODIUM (PORCINE) 1000 UNIT/ML DIALYSIS: 1000.0000 [IU] | INTRAMUSCULAR | Status: DC | PRN

## 2017-05-14 MED ORDER — LIDOCAINE HCL (PF) 1 % IJ SOLN: 5.0000 mL | INTRAMUSCULAR | Status: DC | PRN

## 2017-05-14 MED ORDER — PENTAFLUOROPROP-TETRAFLUOROETH EX AERO: 1.0000 "application " | INHALATION_SPRAY | CUTANEOUS | Status: DC | PRN

## 2017-05-14 NOTE — Procedures (Signed)
Patient seen on Hemodialysis. QB 200, UF goal 1L Treatment adjusted as needed.  Elmarie Shiley MD [facility]. Office # [phone] Pager # [phone] 5:54 PM

## 2017-05-14 NOTE — Progress Notes (Addendum)
Patient ID: Noah Garner, male   DOB: March 13, 1961, 56 y.o.   MRN: [id number] [facility] Progress Note   Assessment/ Plan:   1. Acute kidney injury on chronic kidney disease stage IV---Now suspect progression to ESRD: Suspect progression of chronic kidney disease versus hemodynamically mediated (possibly by CHF exacerbation) worsening of renal function. Clinically he appears to be responding to diuretic therapy (recognizes that urine collection is likely inaccurate) and indeed his edema is much better on physical exam. Unfortunately, he does have worsening renal function and is now developing asterixis for which we will now start hemodialysis he had his left radiocephalic fistula. We'll begin the process for outpatient dialysis unit placement 2. Bilateral leg swelling:  secondary to volume overload-initially treated briefly with antibiotics for coverage of cellulitis continue to monitor with ultrafiltration and hemodialysis.  3. Hypertension: Blood pressure currently appears to be under fair control, monitor with ongoing diuresis. 4. Anemia of chronic disease: Iron deficiency-iron saturation of 15% with permissive ferritin, give intravenous ferric gluconate and begin subcutaneous Aranesp.  5. Morbid obesity: continue supportive management for obstructive sleep apnea 6. Secondary hyperparathyroidism: phosphorus level 5.4, monitor with hemodialysis.PTH pending  Subjective:   Reports uncomfortable night with pain over various areas as well as difficulty falling asleep.    Objective:   BP 120/63 (BP Location: Right Arm)   Pulse 76   Temp 97.8 F (36.6 C) (Oral)   Resp 18   Ht 5\' 11"  (1.803 m)   Wt (!) 197.3 kg (435 lb)   SpO2 100%   BMI 60.67 kg/m   Intake/Output Summary (Last 24 hours) at 05/14/17 0948 Last data filed at 05/14/17 0500  Gross per 24 hour  Intake              564 ml  Output             1270 ml  Net             -706 ml   Weight change: 16.3 kg (36  lb)  Physical Exam: [id number] resting in bed, watching television CVS: Pulse regular rhythm, normal rate Resp: Anteriorly clear to auscultation, no rales/rhonchi Abd: Soft, obese, nontender Ext: 2+ edema over lower extremities with chronic venous stasis changes, clean gauze dressing left anterior pretibial. Asterixis noted on exam  Imaging: No results found.  Labs: BMET  Recent Labs Lab 05/11/17 1915 05/12/17 0524 05/13/17 0323 05/14/17 0553  NA 139 137 139 139  K 3.8 3.6 3.7 4.1  CL 102 101 104 103  CO2 22 24 23 24   GLUCOSE 143* 147* 139* 118*  BUN 94* 99* 109* 114*  CREATININE 6.24* 6.23* 6.59* 6.80*  CALCIUM 9.3 9.0 8.7* 9.1  PHOS  --   --  5.4*  --    CBC  Recent Labs Lab 05/11/17 1915 05/12/17 0524 05/14/17 0553  WBC 18.8* 19.4* 17.0*  NEUTROABS 16.9*  --   --   HGB 9.2* 8.1* 8.4*  HCT 27.5* 24.8* 26.2*  MCV 80.9 80.5 81.9  PLT 238 253 275   Medications:    . aspirin EC  81 mg Oral Daily  . carvedilol  50 mg Oral BID WC  . Chlorhexidine Gluconate Cloth  6 each Topical Daily  . darbepoetin (ARANESP) injection - NON-DIALYSIS  200 mcg Subcutaneous Q Thu-1800  . heparin  5,000 Units Subcutaneous Q8H  . hydrocerin  1 application Topical Daily  . metolazone  5 mg Oral Daily  . mupirocin ointment  1 application Nasal  BID  . predniSONE  5 mg Oral Daily   Elmarie Shiley, MD 05/14/2017, 9:48 AM

## 2017-05-14 NOTE — Progress Notes (Signed)
PROGRESS NOTE    Noah Garner  HER:[id number] DOB: 11-13-1960 DOA: 05/11/2017 PCP: Delilah Shan, MD    Brief Narrative:  56 year old male presented with leg swelling. Patient does have history of systolic heart failure, chronic kidney disease stage IV, peripheral vascular disease, morbid obesity. Patient reported worsening lower extremity edema for the last 10 days, associated with local pain and dyspnea. On initial physical examination blood pressure 113/68, heart rate 87, respiratory rate 20, temperature 98.9, saturation 95%. Moist mucous membranes, lungs were clear to auscultation bilaterally, no wheezing rales or rhonchi, heart S1-S2 present rhythmic, no gallops, abdomen was nontender and nondistended, positive lower extremity edema 2+ pitting.    Patient was admitted to the hospital with the working diagnosis of worsening extremity edema, volume overload, complicated by suspected cellulitis   Assessment & Plan:   Principal Problem:   Wound infection Active Problems:   Acute on chronic combined systolic and diastolic CHF (congestive heart failure) (HCC)   Hypertension   Stasis ulcer ([place])   Morbid obesity with alveolar hypoventilation (HCC)   Rheumatoid arthritis (HCC)   Anemia of chronic disease   Leukocytosis   ESRD (end stage renal disease) (HCC)   Cellulitis   1. Volume overload with worsening renal function, AKI on CKD stage 4-5 with uremic symptoms. Patient with uremic symptoms and still hypervolemia, urine output 1270 over last 24 hours, patient will have HD today, per nephrology recommendations. Positive access. Will need outpatient HD facility.   2. HTN. Blood pressure systolic 856 to 314, will continue coreg, clinically hypervolemic, on high doses of furosemide.   3. Anemia of chronic disease with iron deficiency. Hb and hct have remained stable, will follow on cell count in am.  4. Morbid obesity. dvt px and physical therapy as tolerated.   5. Left leg  ulceration. No signs of systemic infection, will discontinue vancomycin, will continue close clinical follow up.   DVT prophylaxis: heparin   Code Status: full  Family Communication: no family at the bedside Disposition Plan: home / snf   Consultants:   Nephrology   Procedures:     Antimicrobials:       Subjective: Patient deconditioned, responds to simple questions, noted to be somnolent. Unable to get full history due to change in mentation .   Objective: Vitals:   05/13/17 1358 05/13/17 2057 05/14/17 0500 05/14/17 1347  BP: (!) 114/56 126/73 120/63 132/72  Pulse: 78 75 76 80  Resp:  18  20  Temp: 98.8 F (37.1 C) 98.6 F (37 C) 97.8 F (36.6 C) 98.7 F (37.1 C)  TempSrc: Oral Oral Oral Oral  SpO2: 100% 99% 100% 94%  Weight:   (!) 197.3 kg (435 lb)   Height:        Intake/Output Summary (Last 24 hours) at 05/14/17 1528 Last data filed at 05/14/17 1340  Gross per 24 hour  Intake              512 ml  Output             1610 ml  Net            -1098 ml   Filed Weights   05/12/17 0112 05/13/17 0605 05/14/17 0500  Weight: (!) 170.2 kg (375 lb 3.6 oz) (!) 181 kg (399 lb) (!) 197.3 kg (435 lb)    Examination:  General: deconditioned Neurology: somnolent E ENT: positive pallor, no icterus, oral mucosa dry Cardiovascular: distant S1-S2 present, rhythmic, no gallops, rubs, or murmurs.  No jugular venous distention, no lower extremity edema. Pulmonary:  breath sounds bilaterally decreased, poor inspiratory effort, with decreased air movement, no wheezing, rhonchi or rales. Gastrointestinal. Abdomen flat, no organomegaly, non tender, no rebound or guarding Skin. Left leg ulcerated wound with dressing in place.  Musculoskeletal: no joint deformities     Data Reviewed: I have personally reviewed following labs and imaging studies  CBC:  Recent Labs Lab 05/11/17 1915 05/12/17 0524 05/14/17 0553  WBC 18.8* 19.4* 17.0*  NEUTROABS 16.9*  --   --    HGB 9.2* 8.1* 8.4*  HCT 27.5* 24.8* 26.2*  MCV 80.9 80.5 81.9  PLT 238 253 035   Basic Metabolic Panel:  Recent Labs Lab 05/11/17 1915 05/12/17 0524 05/13/17 0323 05/14/17 0553  NA 139 137 139 139  K 3.8 3.6 3.7 4.1  CL 102 101 104 103  CO2 22 24 23 24   GLUCOSE 143* 147* 139* 118*  BUN 94* 99* 109* 114*  CREATININE 6.24* 6.23* 6.59* 6.80*  CALCIUM 9.3 9.0 8.7* 9.1  MG  --   --  2.3  --   PHOS  --   --  5.4*  --    GFR: Estimated Creatinine Clearance: 21.3 mL/min (A) (by C-G formula based on SCr of 6.8 mg/dL (H)). Liver Function Tests:  Recent Labs Lab 05/11/17 1915 05/13/17 0323  AST 17  --   ALT 26  --   ALKPHOS 84  --   BILITOT 1.2  --   PROT 8.3*  --   ALBUMIN 2.8* 2.3*   No results for input(s): LIPASE, AMYLASE in the last 168 hours. No results for input(s): AMMONIA in the last 168 hours. Coagulation Profile: No results for input(s): INR, PROTIME in the last 168 hours. Cardiac Enzymes: No results for input(s): CKTOTAL, CKMB, CKMBINDEX, TROPONINI in the last 168 hours. BNP (last 3 results) No results for input(s): PROBNP in the last 8760 hours. HbA1C: No results for input(s): HGBA1C in the last 72 hours. CBG: No results for input(s): GLUCAP in the last 168 hours. Lipid Profile: No results for input(s): CHOL, HDL, LDLCALC, TRIG, CHOLHDL, LDLDIRECT in the last 72 hours. Thyroid Function Tests: No results for input(s): TSH, T4TOTAL, FREET4, T3FREE, THYROIDAB in the last 72 hours. Anemia Panel:  Recent Labs  05/12/17 1415  FERRITIN 771*  TIBC 148*  IRON 23*      Radiology Studies: I have reviewed all of the imaging during this hospital visit personally     Scheduled Meds: . aspirin EC  81 mg Oral Daily  . carvedilol  50 mg Oral BID WC  . Chlorhexidine Gluconate Cloth  6 each Topical Daily  . darbepoetin (ARANESP) injection - NON-DIALYSIS  200 mcg Subcutaneous Q Thu-1800  . heparin  5,000 Units Subcutaneous Q8H  . hydrocerin  1  application Topical Daily  . metolazone  5 mg Oral Daily  . mupirocin ointment  1 application Nasal BID  . predniSONE  5 mg Oral Daily   Continuous Infusions: . ferric gluconate (FERRLECIT/NULECIT) IV Stopped (05/14/17 1220)  . furosemide Stopped (05/14/17 1035)     LOS: 3 days     Lori Liew Gerome Apley, MD [facility] Pager [phone]

## 2017-05-14 NOTE — Progress Notes (Signed)
  Echocardiogram 2D Echocardiogram has been performed.  Noah Garner L Androw 05/14/2017, 2:50 PM

## 2017-05-14 NOTE — Care Management Important Message (Signed)
Important Message  Patient Details  Name: Noah Garner MRN: [id number] Date of Birth: May 10, 1961   Medicare Important Message Given:  Yes    Nathen May 05/14/2017, 9:43 AM

## 2017-05-14 NOTE — Progress Notes (Signed)
Pt to Hemodialysis via bed, A&O x4. Report was given to [facility].

## 2017-05-15 LAB — HEPATITIS B CORE ANTIBODY, TOTAL: HEP B C TOTAL AB: NEGATIVE

## 2017-05-15 LAB — HEPATITIS B SURFACE ANTIBODY,QUALITATIVE: Hep B S Ab: NONREACTIVE

## 2017-05-15 LAB — PARATHYROID HORMONE, INTACT (NO CA): PTH: 261 pg/mL — ABNORMAL HIGH (ref 15–65)

## 2017-05-15 MED ORDER — FUROSEMIDE 80 MG PO TABS: 80.0000 mg | ORAL_TABLET | Freq: Two times a day (BID) | ORAL | Status: DC

## 2017-05-15 NOTE — Progress Notes (Signed)
Patient ID: Noah Garner, male   DOB: 04-Jul-1961, 56 y.o.   MRN: [id number] [facility] Progress Note   Assessment/ Plan:   1. End stage renal disease-following progression versus acute on chronic injury: started on hemodialysis yesterday after refractoriness to high-dose diuretic therapy with continued worsening of renal function. Successfully had ultrafiltration of about 1.5 L and schedule for next dialysis treatment today. Process underway for outpatient hemodialysis unit placement. 2. Bilateral leg swelling:  secondary to volume overload rather than cellulitis, transiently on antibiotics that were discontinued and will continue efforts at ultrafiltration at this time. Discontinue high-dose intravenous diuretics, transition to furosemide and continue ultrafiltration-using diuretics to augment urine output. 3. Hypertension: Blood pressure currently appears to be under fair control, monitor with ongoing diuresis. 4. Anemia of chronic disease: continue intravenous iron for correction of iron deficiency and continue to monitor on Aranesp-no indications for PRBC transfusion.  5. Morbid obesity: continue supportive management for obstructive sleep apnea 6. Secondary hyperparathyroidism: phosphorus level 5.4, monitor with hemodialysis.PTH to be checked with dialysis today.  Subjective:   Complains of a lot of pain when cannulated yesterday for dialysis.    Objective:   BP (!) 143/81 (BP Location: Right Arm)   Pulse 81   Temp 98.9 F (37.2 C) (Oral)   Resp 19   Ht 5\' 11"  (1.803 m)   Wt (!) 173.7 kg (383 lb)   SpO2 99%   BMI 53.42 kg/m   Intake/Output Summary (Last 24 hours) at 05/15/17 1100 Last data filed at 05/15/17 0601  Gross per 24 hour  Intake              560 ml  Output             2500 ml  Net            -1940 ml   Weight change: -23.587 kg (-52 lb)  Physical Exam: [id number] resting in bed, listening to television CVS: Pulse regular rhythm, normal  rate Resp: Anteriorly clear to auscultation, no rales/rhonchi Abd: Soft, obese, nontender Ext: 2+ edema over lower extremities with chronic venous stasis changes, clean gauze dressing left anterior pretibial.  Imaging: No results found.  Labs: BMET  Recent Labs Lab 05/11/17 1915 05/12/17 0524 05/13/17 0323 05/14/17 0553  NA 139 137 139 139  K 3.8 3.6 3.7 4.1  CL 102 101 104 103  CO2 22 24 23 24   GLUCOSE 143* 147* 139* 118*  BUN 94* 99* 109* 114*  CREATININE 6.24* 6.23* 6.59* 6.80*  CALCIUM 9.3 9.0 8.7* 9.1  PHOS  --   --  5.4*  --    CBC  Recent Labs Lab 05/11/17 1915 05/12/17 0524 05/14/17 0553  WBC 18.8* 19.4* 17.0*  NEUTROABS 16.9*  --   --   HGB 9.2* 8.1* 8.4*  HCT 27.5* 24.8* 26.2*  MCV 80.9 80.5 81.9  PLT 238 253 275   Medications:    . aspirin EC  81 mg Oral Daily  . carvedilol  50 mg Oral BID WC  . Chlorhexidine Gluconate Cloth  6 each Topical Daily  . darbepoetin (ARANESP) injection - NON-DIALYSIS  200 mcg Subcutaneous Q Thu-1800  . furosemide  80 mg Oral BID  . heparin  5,000 Units Subcutaneous Q8H  . hydrocerin  1 application Topical Daily  . mupirocin ointment  1 application Nasal BID  . predniSONE  5 mg Oral Daily   Elmarie Shiley, MD 05/15/2017, 11:00 AM

## 2017-05-15 NOTE — Progress Notes (Signed)
Pt has refused cpap for several nights stating it won't adjust correctly.  RT pulled machine from room but informed pt we could bring it back if he changes his mind.  Rt will monitor.

## 2017-05-15 NOTE — Progress Notes (Signed)
PROGRESS NOTE    Noah Garner  QIH:[id number] DOB: 26-Aug-1961 DOA: 05/11/2017 PCP: Delilah Shan, MD    Brief Narrative:  56 year old male presented with leg swelling. Patient does have history of systolic heart failure, chronic kidney disease stage IV, peripheral vascular disease, morbid obesity. Patient reported worsening lower extremity edema for the last 10 days, associated with local pain and dyspnea. On initial physical examination blood pressure 113/68, heart rate 87, respiratory rate 20, temperature 98.9, saturation 95%. Moist mucous membranes, lungs were clear to auscultation bilaterally, no wheezing rales or rhonchi, heart S1-S2 present rhythmic, no gallops, abdomen was nontender and nondistended, positive lower extremity edema 2+ pitting.   Patient was admitted to the hospital with the working diagnosis of worsening extremity edema, volume overload, complicated by suspected cellulitis   Assessment & Plan:   Principal Problem:   Wound infection Active Problems:   Acute on chronic combined systolic and diastolic CHF (congestive heart failure) (HCC)   Hypertension   Stasis ulcer ([place])   Morbid obesity with alveolar hypoventilation (HCC)   Rheumatoid arthritis (HCC)   Anemia of chronic disease   Leukocytosis   ESRD (end stage renal disease) (HCC)   Cellulitis   1. Volume overload with worsening renal function, AKI on CKD stage 4-5 with uremic symptoms. Patient has responded well to renal replacement therapy, more awake and reactive, improved edema. Plan for HD today, target UF 1.5 liters. Will hold on high dose furosemide. Case discussed with Dr. Posey Pronto from nephrology.   2. HTN. Blood pressure systolic 875 to 643, on coreg. Will continue close monitoring, for UF today.   3. Anemia of chronic renal disease with iron deficiency. Hb and hct have remained stable, at 8,4 and 26, started on Aranesp and iron.   4. Morbid obesity. dvt px and physical therapy as tolerated,  will need close follow up as outpatient.   5. Left leg ulceration. Continue local wound care. No indication for systemic antibiotic therapy for now.   DVT prophylaxis: heparin  Code Status:full  Family Communication:no family at the bedside Disposition Plan:home / snf   Consultants:  Nephrology   Procedures:    Antimicrobials:     Subjective: Patient with significant improvement of symptoms after hemodialysis, currently reports improved edema of lower extremities, no dyspnea or chest pain. Clinically more awake and reactive.   Objective: Vitals:   05/14/17 1928 05/14/17 2000 05/14/17 2144 05/15/17 0601  BP: (!) 121/99 133/76 (!) 145/67 (!) 143/81  Pulse: 75 80  81  Resp: (!) 22   19  Temp: 97.8 F (36.6 C) 98.6 F (37 C)  98.9 F (37.2 C)  TempSrc: Oral Oral  Oral  SpO2: 95%   99%  Weight:    (!) 173.7 kg (383 lb)  Height:        Intake/Output Summary (Last 24 hours) at 05/15/17 1101 Last data filed at 05/15/17 0601  Gross per 24 hour  Intake              560 ml  Output             2500 ml  Net            -1940 ml   Filed Weights   05/13/17 0605 05/14/17 0500 05/15/17 0601  Weight: (!) 181 kg (399 lb) (!) 197.3 kg (435 lb) (!) 173.7 kg (383 lb)    Examination:  General: dcconditioned Neurology: Awake and alert, non focal  E ENT: mild pallor, no icterus, oral  mucosa moist Cardiovascular: S1-S2 present, rhythmic, no gallops, rubs, or murmurs. No jugular venous distention, no lower extremity edema. Pulmonary: vesicular breath sounds bilaterally, adequate air movement, no wheezing, rhonchi or rales. Poor inspiratory effort due to body habitus.  Gastrointestinal. Abdomen protuberant, no organomegaly, non tender, no rebound or guarding Skin. No rashes Musculoskeletal: no joint deformities     Data Reviewed: I have personally reviewed following labs and imaging studies  CBC:  Recent Labs Lab 05/11/17 1915 05/12/17 0524 05/14/17 0553    WBC 18.8* 19.4* 17.0*  NEUTROABS 16.9*  --   --   HGB 9.2* 8.1* 8.4*  HCT 27.5* 24.8* 26.2*  MCV 80.9 80.5 81.9  PLT 238 253 829   Basic Metabolic Panel:  Recent Labs Lab 05/11/17 1915 05/12/17 0524 05/13/17 0323 05/14/17 0553  NA 139 137 139 139  K 3.8 3.6 3.7 4.1  CL 102 101 104 103  CO2 22 24 23 24   GLUCOSE 143* 147* 139* 118*  BUN 94* 99* 109* 114*  CREATININE 6.24* 6.23* 6.59* 6.80*  CALCIUM 9.3 9.0 8.7* 9.1  MG  --   --  2.3  --   PHOS  --   --  5.4*  --    GFR: Estimated Creatinine Clearance: 19.7 mL/min (A) (by C-G formula based on SCr of 6.8 mg/dL (H)). Liver Function Tests:  Recent Labs Lab 05/11/17 1915 05/13/17 0323 05/14/17 1741  AST 17  --   --   ALT 26  --  23  ALKPHOS 84  --   --   BILITOT 1.2  --   --   PROT 8.3*  --   --   ALBUMIN 2.8* 2.3*  --    No results for input(s): LIPASE, AMYLASE in the last 168 hours. No results for input(s): AMMONIA in the last 168 hours. Coagulation Profile: No results for input(s): INR, PROTIME in the last 168 hours. Cardiac Enzymes: No results for input(s): CKTOTAL, CKMB, CKMBINDEX, TROPONINI in the last 168 hours. BNP (last 3 results) No results for input(s): PROBNP in the last 8760 hours. HbA1C: No results for input(s): HGBA1C in the last 72 hours. CBG: No results for input(s): GLUCAP in the last 168 hours. Lipid Profile: No results for input(s): CHOL, HDL, LDLCALC, TRIG, CHOLHDL, LDLDIRECT in the last 72 hours. Thyroid Function Tests: No results for input(s): TSH, T4TOTAL, FREET4, T3FREE, THYROIDAB in the last 72 hours. Anemia Panel:  Recent Labs  05/12/17 1415  FERRITIN 771*  TIBC 148*  IRON 23*      Radiology Studies: I have reviewed all of the imaging during this hospital visit personally     Scheduled Meds: . aspirin EC  81 mg Oral Daily  . carvedilol  50 mg Oral BID WC  . Chlorhexidine Gluconate Cloth  6 each Topical Daily  . darbepoetin (ARANESP) injection - NON-DIALYSIS  200 mcg  Subcutaneous Q Thu-1800  . furosemide  80 mg Oral BID  . heparin  5,000 Units Subcutaneous Q8H  . hydrocerin  1 application Topical Daily  . mupirocin ointment  1 application Nasal BID  . predniSONE  5 mg Oral Daily   Continuous Infusions: . ferric gluconate (FERRLECIT/NULECIT) IV Stopped (05/14/17 1220)     LOS: 4 days      Jahnyla Parrillo Gerome Apley, MD [facility] Pager [phone]

## 2017-05-16 LAB — RENAL FUNCTION PANEL
ALBUMIN: 2.2 g/dL — AB (ref 3.5–5.0)
Anion gap: 11 (ref 5–15)
BUN: 95 mg/dL — AB (ref 6–20)
CHLORIDE: 98 mmol/L — AB (ref 101–111)
CO2: 28 mmol/L (ref 22–32)
Calcium: 8.9 mg/dL (ref 8.9–10.3)
Creatinine, Ser: 5.47 mg/dL — ABNORMAL HIGH (ref 0.61–1.24)
GFR calc Af Amer: 12 mL/min — ABNORMAL LOW (ref >60)
GFR calc non Af Amer: 11 mL/min — ABNORMAL LOW (ref >60)
GLUCOSE: 140 mg/dL — AB (ref 65–99)
POTASSIUM: 3.5 mmol/L (ref 3.5–5.1)
Phosphorus: 5.1 mg/dL — ABNORMAL HIGH (ref 2.5–4.6)
Sodium: 137 mmol/L (ref 135–145)

## 2017-05-16 LAB — CBC
HEMATOCRIT: 23.4 % — AB (ref 39.0–52.0)
Hemoglobin: 7.7 g/dL — ABNORMAL LOW (ref 13.0–17.0)
MCH: 26.2 pg (ref 26.0–34.0)
MCHC: 32.9 g/dL (ref 30.0–36.0)
MCV: 79.6 fL (ref 78.0–100.0)
Platelets: 308 10*3/uL (ref 150–400)
RBC: 2.94 MIL/uL — AB (ref 4.22–5.81)
RDW: 15.7 % — AB (ref 11.5–15.5)
WBC: 14.7 10*3/uL — AB (ref 4.0–10.5)

## 2017-05-16 MED ORDER — ROPINIROLE HCL 0.5 MG PO TABS
0.5000 mg | ORAL_TABLET | Freq: Every day | ORAL | Status: DC
  Administered 2017-05-16 – 2017-05-24 (×9): 0.5 mg via ORAL
  Filled 2017-05-16 (×9): qty 1

## 2017-05-16 MED ORDER — HEPARIN SODIUM (PORCINE) 1000 UNIT/ML DIALYSIS
5000.0000 [IU] | INTRAMUSCULAR | Status: DC | PRN
  Administered 2017-05-16: 5000 [IU] via INTRAVENOUS_CENTRAL
  Filled 2017-05-16 (×2): qty 5

## 2017-05-16 NOTE — Progress Notes (Signed)
PROGRESS NOTE    Noah Garner  BPZ:[id number] DOB: 01/31/1961 DOA: 05/11/2017 PCP: Delilah Shan, MD    Brief Narrative:  56 year old male presented with leg swelling. Patient does have history of systolic heart failure, chronic kidney disease stage IV, peripheral vascular disease, morbid obesity. Patient reported worsening lower extremity edema for the last 10 days, associated with local pain and dyspnea. On initial physical examination blood pressure 113/68, heart rate 87, respiratory rate 20, temperature 98.9, saturation 95%. Moist mucous membranes, lungs were clear to auscultation bilaterally, no wheezing rales or rhonchi, heart S1-S2 present rhythmic, no gallops, abdomen was nontender and nondistended, positive lower extremity edema 2+ pitting.   Patient was admitted to the hospital with the working diagnosis of worsening extremity edema, volume overload, complicated by suspected cellulitis.   Patient did not respond to medical therapy, developed worsening uremia, started on hemodialysis.     Assessment & Plan:   Principal Problem:   Wound infection Active Problems:   Acute on chronic combined systolic and diastolic CHF (congestive heart failure) (HCC)   Hypertension   Stasis ulcer (HCC)   Morbid obesity with alveolar hypoventilation (HCC)   Rheumatoid arthritis (HCC)   Anemia of chronic disease   Leukocytosis   ESRD (end stage renal disease) (HCC)   Cellulitis   1. Volume overload with worsening renal function, AKI on CKD stage 4-5 with uremic symptoms. Patient declared end stage renal disease, will need outpatient HD unit. Plan for hemodialysis in am. Will continue aransep, ferric gluconate. {hos at 5,1 and calcium at 8,9.    2. HTN. Blood pressure systolic 242 to 353, continue coreg.   3. Anemia of chronic renal disease with iron deficiency. Hb down to 7,7 and hct 23.4. Hold on blood transfusion for now, idf needed may receive on HD. Target hb not higher than 11.    4. Morbid obesity. Continue dvt px. Will consult physical therapy, out of bed as tolerated.   5. Left leg ulceration. Wound care.      DVT prophylaxis: heparin  Code Status:full  Family Communication:no family at the bedside Disposition Plan:home / snf   Consultants:  Nephrology   Procedures:    Antimicrobials:   Subjective: Patient feeling well, no nausea or vomiting, no dyspnea or chest pain, improving lower extremity edema.   Objective: Vitals:   05/16/17 0230 05/16/17 0300 05/16/17 0328 05/16/17 0531  BP: (!) 120/103 124/89 (!) 93/54 125/75  Pulse: 83 84 83 87  Resp:   20 19  Temp:   99.3 F (37.4 C) 98.1 F (36.7 C)  TempSrc:   Oral Oral  SpO2: 97% 95% 98% 98%  Weight:      Height:        Intake/Output Summary (Last 24 hours) at 05/16/17 1306 Last data filed at 05/16/17 1002  Gross per 24 hour  Intake              452 ml  Output             3000 ml  Net            -2548 ml   Filed Weights   05/13/17 0605 05/14/17 0500 05/15/17 0601  Weight: (!) 181 kg (399 lb) (!) 197.3 kg (435 lb) (!) 173.7 kg (383 lb)    Examination:  General: Not in pain or dyspnea, deconditioned Neurology: Awake and alert, non focal  E ENT: no pallor, no icterus, oral mucosa moist Cardiovascular: S1-S2 present, rhythmic, no gallops, rubs, or  murmurs. No jugular venous distention, no lower extremity edema. Pulmonary: decreased breath sounds bilaterally at bases, adequate air movement, no wheezing, rhonchi or rales. Gastrointestinal. Abdomen protuberant, no organomegaly, non tender, no rebound or guarding Skin. No rashes Musculoskeletal: no joint deformities     Data Reviewed: I have personally reviewed following labs and imaging studies  CBC:  Recent Labs Lab 05/11/17 1915 05/12/17 0524 05/14/17 0553 05/16/17 0052  WBC 18.8* 19.4* 17.0* 14.7*  NEUTROABS 16.9*  --   --   --   HGB 9.2* 8.1* 8.4* 7.7*  HCT 27.5* 24.8* 26.2* 23.4*  MCV 80.9 80.5  81.9 79.6  PLT 238 253 275 706   Basic Metabolic Panel:  Recent Labs Lab 05/11/17 1915 05/12/17 0524 05/13/17 0323 05/14/17 0553 05/16/17 0052  NA 139 137 139 139 137  K 3.8 3.6 3.7 4.1 3.5  CL 102 101 104 103 98*  CO2 22 24 23 24 28   GLUCOSE 143* 147* 139* 118* 140*  BUN 94* 99* 109* 114* 95*  CREATININE 6.24* 6.23* 6.59* 6.80* 5.47*  CALCIUM 9.3 9.0 8.7* 9.1 8.9  MG  --   --  2.3  --   --   PHOS  --   --  5.4*  --  5.1*   GFR: Estimated Creatinine Clearance: 24.5 mL/min (A) (by C-G formula based on SCr of 5.47 mg/dL (H)). Liver Function Tests:  Recent Labs Lab 05/11/17 1915 05/13/17 0323 05/14/17 1741 05/16/17 0052  AST 17  --   --   --   ALT 26  --  23  --   ALKPHOS 84  --   --   --   BILITOT 1.2  --   --   --   PROT 8.3*  --   --   --   ALBUMIN 2.8* 2.3*  --  2.2*   No results for input(s): LIPASE, AMYLASE in the last 168 hours. No results for input(s): AMMONIA in the last 168 hours. Coagulation Profile: No results for input(s): INR, PROTIME in the last 168 hours. Cardiac Enzymes: No results for input(s): CKTOTAL, CKMB, CKMBINDEX, TROPONINI in the last 168 hours. BNP (last 3 results) No results for input(s): PROBNP in the last 8760 hours. HbA1C: No results for input(s): HGBA1C in the last 72 hours. CBG: No results for input(s): GLUCAP in the last 168 hours. Lipid Profile: No results for input(s): CHOL, HDL, LDLCALC, TRIG, CHOLHDL, LDLDIRECT in the last 72 hours. Thyroid Function Tests: No results for input(s): TSH, T4TOTAL, FREET4, T3FREE, THYROIDAB in the last 72 hours. Anemia Panel: No results for input(s): VITAMINB12, FOLATE, FERRITIN, TIBC, IRON, RETICCTPCT in the last 72 hours.    Radiology Studies: I have reviewed all of the imaging during this hospital visit personally     Scheduled Meds: . aspirin EC  81 mg Oral Daily  . carvedilol  50 mg Oral BID WC  . Chlorhexidine Gluconate Cloth  6 each Topical Daily  . darbepoetin (ARANESP)  injection - NON-DIALYSIS  200 mcg Subcutaneous Q Thu-1800  . heparin  5,000 Units Subcutaneous Q8H  . hydrocerin  1 application Topical Daily  . mupirocin ointment  1 application Nasal BID  . predniSONE  5 mg Oral Daily  . rOPINIRole  0.5 mg Oral QHS   Continuous Infusions: . ferric gluconate (FERRLECIT/NULECIT) IV 125 mg (05/16/17 1126)     LOS: 5 days      Phyllis Whitefield Gerome Apley, MD [facility] Pager [phone]

## 2017-05-16 NOTE — Progress Notes (Signed)
Pt refuses cpap at this time.  RT will monitor. 

## 2017-05-16 NOTE — Progress Notes (Signed)
Patient states he didn't get a dinner tray.  Service response called-stated that patient did not call to request dinner tray and that is why he did not get a tray.Patient has had a diet order since 8/28. Requested that patient get a dinner tray. Service response will place request.

## 2017-05-16 NOTE — Progress Notes (Signed)
Patient ID: Noah Garner, male   DOB: 01-26-1961, 56 y.o.   MRN: [id number] [facility] Progress Note   Assessment/ Plan:   1. End stage renal disease-following progression versus acute on chronic injury: started on hemodialysis after refractory to diuretics with development of uremic asterixis and worsening renal function. Now status post 2 dialysis treatments and will order for his third tomorrow. Process underway for outpatient hemodialysis unit placement. 2. Bilateral leg swelling:  secondary to volume overload rather than cellulitis, transiently on antibiotics that were discontinued and will continue efforts at ultrafiltration at this time. Continue ultrafiltration for volume unloading with dialysis. Will start on ropinirole for restless legs. 3. Hypertension: Blood pressure currently appears to be under fair control, monitor with UF/HD 4. Anemia of chronic disease: continue intravenous iron for correction of iron deficiency and continue to monitor on Aranesp-no indications for PRBC transfusion.  5. Morbid obesity: continue supportive management for obstructive sleep apnea-he declined CPAP 6. Secondary hyperparathyroidism: phosphorus level 5.4, monitor with hemodialysis.PTH pending.  Subjective:   Complains of restless legs and inability to sleep well last night because of dialysis being done at 1 AM.    Objective:   BP 125/75 (BP Location: Right Wrist)   Pulse 87   Temp 98.1 F (36.7 C) (Oral)   Resp 19   Ht 5\' 11"  (1.803 m)   Wt (!) 173.7 kg (383 lb)   SpO2 98%   BMI 53.42 kg/m   Intake/Output Summary (Last 24 hours) at 05/16/17 1145 Last data filed at 05/16/17 1002  Gross per 24 hour  Intake              452 ml  Output             3000 ml  Net            -2548 ml   Weight change:   Physical Exam: [id number] sleeping in bed, awakens to conversation CVS: Pulse regular rhythm, normal rate Resp: Anteriorly clear to auscultation, no rales/rhonchi Abd:  Soft, obese, nontender Ext: 2+ edema over lower extremities with chronic venous stasis changes, clean gauze dressing left anterior pretibial. Good thrill/bruit left RCF.  Imaging: No results found.  Labs: BMET  Recent Labs Lab 05/11/17 1915 05/12/17 0524 05/13/17 0323 05/14/17 0553 05/16/17 0052  NA 139 137 139 139 137  K 3.8 3.6 3.7 4.1 3.5  CL 102 101 104 103 98*  CO2 22 24 23 24 28   GLUCOSE 143* 147* 139* 118* 140*  BUN 94* 99* 109* 114* 95*  CREATININE 6.24* 6.23* 6.59* 6.80* 5.47*  CALCIUM 9.3 9.0 8.7* 9.1 8.9  PHOS  --   --  5.4*  --  5.1*   CBC  Recent Labs Lab 05/11/17 1915 05/12/17 0524 05/14/17 0553 05/16/17 0052  WBC 18.8* 19.4* 17.0* 14.7*  NEUTROABS 16.9*  --   --   --   HGB 9.2* 8.1* 8.4* 7.7*  HCT 27.5* 24.8* 26.2* 23.4*  MCV 80.9 80.5 81.9 79.6  PLT 238 253 275 308   Medications:    . aspirin EC  81 mg Oral Daily  . carvedilol  50 mg Oral BID WC  . Chlorhexidine Gluconate Cloth  6 each Topical Daily  . darbepoetin (ARANESP) injection - NON-DIALYSIS  200 mcg Subcutaneous Q Thu-1800  . heparin  5,000 Units Subcutaneous Q8H  . hydrocerin  1 application Topical Daily  . mupirocin ointment  1 application Nasal BID  . predniSONE  5 mg Oral Daily  Elmarie Shiley, MD 05/16/2017, 11:45 AM

## 2017-05-17 LAB — CULTURE, BLOOD (ROUTINE X 2)
CULTURE: NO GROWTH
Culture: NO GROWTH
SPECIAL REQUESTS: ADEQUATE
Special Requests: ADEQUATE

## 2017-05-17 LAB — BASIC METABOLIC PANEL
ANION GAP: 11 (ref 5–15)
BUN: 74 mg/dL — ABNORMAL HIGH (ref 6–20)
CO2: 28 mmol/L (ref 22–32)
Calcium: 9.3 mg/dL (ref 8.9–10.3)
Chloride: 99 mmol/L — ABNORMAL LOW (ref 101–111)
Creatinine, Ser: 4.94 mg/dL — ABNORMAL HIGH (ref 0.61–1.24)
GFR calc Af Amer: 14 mL/min — ABNORMAL LOW (ref >60)
GFR, EST NON AFRICAN AMERICAN: 12 mL/min — AB (ref >60)
GLUCOSE: 137 mg/dL — AB (ref 65–99)
POTASSIUM: 3.6 mmol/L (ref 3.5–5.1)
Sodium: 138 mmol/L (ref 135–145)

## 2017-05-17 LAB — RENAL FUNCTION PANEL
ALBUMIN: 2.3 g/dL — AB (ref 3.5–5.0)
Anion gap: 12 (ref 5–15)
BUN: 76 mg/dL — ABNORMAL HIGH (ref 6–20)
CALCIUM: 9.4 mg/dL (ref 8.9–10.3)
CHLORIDE: 99 mmol/L — AB (ref 101–111)
CO2: 26 mmol/L (ref 22–32)
CREATININE: 5.13 mg/dL — AB (ref 0.61–1.24)
GFR, EST AFRICAN AMERICAN: 13 mL/min — AB (ref >60)
GFR, EST NON AFRICAN AMERICAN: 11 mL/min — AB (ref >60)
Glucose, Bld: 126 mg/dL — ABNORMAL HIGH (ref 65–99)
PHOSPHORUS: 4 mg/dL (ref 2.5–4.6)
Potassium: 3.7 mmol/L (ref 3.5–5.1)
SODIUM: 137 mmol/L (ref 135–145)

## 2017-05-17 LAB — CBC
HCT: 23.9 % — ABNORMAL LOW (ref 39.0–52.0)
Hemoglobin: 7.8 g/dL — ABNORMAL LOW (ref 13.0–17.0)
MCH: 26.4 pg (ref 26.0–34.0)
MCHC: 32.6 g/dL (ref 30.0–36.0)
MCV: 80.7 fL (ref 78.0–100.0)
Platelets: 334 10*3/uL (ref 150–400)
RBC: 2.96 MIL/uL — ABNORMAL LOW (ref 4.22–5.81)
RDW: 15.6 % — AB (ref 11.5–15.5)
WBC: 17.7 10*3/uL — AB (ref 4.0–10.5)

## 2017-05-17 MED ORDER — HEPARIN SODIUM (PORCINE) 1000 UNIT/ML DIALYSIS: 5000.0000 [IU] | INTRAMUSCULAR | Status: DC | PRN

## 2017-05-17 MED ORDER — POLYETHYLENE GLYCOL 3350 17 G PO PACK
17.0000 g | PACK | Freq: Two times a day (BID) | ORAL | Status: DC
  Administered 2017-05-17 – 2017-05-24 (×9): 17 g via ORAL
  Filled 2017-05-17 (×15): qty 1

## 2017-05-17 NOTE — Progress Notes (Signed)
PROGRESS NOTE    Noah Garner  EZM:[id number] DOB: 07/11/1961 DOA: 05/11/2017 PCP: Delilah Shan, MD    Brief Narrative:  56 year old male presented with leg swelling. Patient does have history of systolic heart failure, chronic kidney disease stage IV, peripheral vascular disease, morbid obesity. Patient reported worsening lower extremity edema for the last 10 days, associated with local pain and dyspnea. On initial physical examination blood pressure 113/68, heart rate 87, respiratory rate 20, temperature 98.9, saturation 95%. Moist mucous membranes, lungs were clear to auscultation bilaterally, no wheezing rales or rhonchi, heart S1-S2 present rhythmic, no gallops, abdomen was nontender and nondistended, positive lower extremity edema 2+ pitting.   Patient was admitted to the hospital with the working diagnosis of worsening extremity edema, volume overload, complicated by suspected cellulitis.   Patient did not respond to medical therapy, developed worsening uremia, started on hemodialysis   Assessment & Plan:   Principal Problem:   Wound infection Active Problems:   Acute on chronic combined systolic and diastolic CHF (congestive heart failure) (HCC)   Hypertension   Stasis ulcer ([place])   Morbid obesity with alveolar hypoventilation (HCC)   Rheumatoid arthritis (HCC)   Anemia of chronic disease   Leukocytosis   ESRD (end stage renal disease) (HCC)   Cellulitis   1. Volume overload with worsening renal function, AKI on CKD stage 4-5 with uremic symptoms. ESRD. Will continue with nephrology recommendations for further HD. Will order physical therapy and out of bed to chair. Needs to be mobile for chair HD. Clinically volume is improving, no further signs of uremia.   2. HTN. Blood pressure systolic 90 to 503, will continuecoreg.   3. Anemia of chronic renal disease with iron deficiency. Hb down to 7,8 and hct 23.9. Will continue aranesp and iv iron on HD, no indication  for transfusion at this time.   4. Morbid obesity. DVT px. Out of bed to chair tid with meals.   5. Left leg ulceration. Wound care, per protocol. No signs of systemic infection.       DVT prophylaxis: heparin  Code Status:full  Family Communication:no family at the bedside Disposition Plan:home / snf   Consultants:  Nephrology   Procedures:    Antimicrobials:   Subjective: Patient feeling better after HD, no chest pain, dyspnea and improved lower extremity edema. Patient with significant weakness, has not been out of bed, not able to move independently.   Objective: Vitals:   05/17/17 0730 05/17/17 0800 05/17/17 0830 05/17/17 0900  BP: 130/77 124/80 128/86 (!) 86/38  Pulse: 87 87 (!) 103 60  Resp:      Temp:      TempSrc:      SpO2:      Weight:      Height:        Intake/Output Summary (Last 24 hours) at 05/17/17 0911 Last data filed at 05/17/17 5465  Gross per 24 hour  Intake             1340 ml  Output             1200 ml  Net              140 ml   Filed Weights   05/13/17 0605 05/14/17 0500 05/15/17 0601  Weight: (!) 181 kg (399 lb) (!) 197.3 kg (435 lb) (!) 173.7 kg (383 lb)    Examination:  General: deconditioned Neurology: Awake and alert, non focal  E ENT: mild pallor, no icterus, oral  mucosa moist Cardiovascular: S1-S2 present, rhythmic, no gallops, rubs, or murmurs. No jugular venous distention, positive ++ lower extremity edema. Pulmonary: vesicular breath sounds bilaterally, adequate air movement, no wheezing, rhonchi or rales. Gastrointestinal. Abdomen flat, no organomegaly, non tender, no rebound or guarding Skin. No rashes Musculoskeletal: no joint deformities     Data Reviewed: I have personally reviewed following labs and imaging studies  CBC:  Recent Labs Lab 05/11/17 1915 05/12/17 0524 05/14/17 0553 05/16/17 0052 05/17/17 0821  WBC 18.8* 19.4* 17.0* 14.7* 17.7*  NEUTROABS 16.9*  --   --   --   --   HGB  9.2* 8.1* 8.4* 7.7* 7.8*  HCT 27.5* 24.8* 26.2* 23.4* 23.9*  MCV 80.9 80.5 81.9 79.6 80.7  PLT 238 253 275 308 354   Basic Metabolic Panel:  Recent Labs Lab 05/13/17 0323 05/14/17 0553 05/16/17 0052 05/17/17 0332 05/17/17 0821  NA 139 139 137 138 137  K 3.7 4.1 3.5 3.6 3.7  CL 104 103 98* 99* 99*  CO2 23 24 28 28 26   GLUCOSE 139* 118* 140* 137* 126*  BUN 109* 114* 95* 74* 76*  CREATININE 6.59* 6.80* 5.47* 4.94* 5.13*  CALCIUM 8.7* 9.1 8.9 9.3 9.4  MG 2.3  --   --   --   --   PHOS 5.4*  --  5.1*  --  4.0   GFR: Estimated Creatinine Clearance: 26.1 mL/min (A) (by C-G formula based on SCr of 5.13 mg/dL (H)). Liver Function Tests:  Recent Labs Lab 05/11/17 1915 05/13/17 0323 05/14/17 1741 05/16/17 0052 05/17/17 0821  AST 17  --   --   --   --   ALT 26  --  23  --   --   ALKPHOS 84  --   --   --   --   BILITOT 1.2  --   --   --   --   PROT 8.3*  --   --   --   --   ALBUMIN 2.8* 2.3*  --  2.2* 2.3*   No results for input(s): LIPASE, AMYLASE in the last 168 hours. No results for input(s): AMMONIA in the last 168 hours. Coagulation Profile: No results for input(s): INR, PROTIME in the last 168 hours. Cardiac Enzymes: No results for input(s): CKTOTAL, CKMB, CKMBINDEX, TROPONINI in the last 168 hours. BNP (last 3 results) No results for input(s): PROBNP in the last 8760 hours. HbA1C: No results for input(s): HGBA1C in the last 72 hours. CBG: No results for input(s): GLUCAP in the last 168 hours. Lipid Profile: No results for input(s): CHOL, HDL, LDLCALC, TRIG, CHOLHDL, LDLDIRECT in the last 72 hours. Thyroid Function Tests: No results for input(s): TSH, T4TOTAL, FREET4, T3FREE, THYROIDAB in the last 72 hours. Anemia Panel: No results for input(s): VITAMINB12, FOLATE, FERRITIN, TIBC, IRON, RETICCTPCT in the last 72 hours.    Radiology Studies: I have reviewed all of the imaging during this hospital visit personally     Scheduled Meds: . aspirin EC  81 mg  Oral Daily  . carvedilol  50 mg Oral BID WC  . Chlorhexidine Gluconate Cloth  6 each Topical Daily  . darbepoetin (ARANESP) injection - NON-DIALYSIS  200 mcg Subcutaneous Q Thu-1800  . heparin  5,000 Units Subcutaneous Q8H  . hydrocerin  1 application Topical Daily  . mupirocin ointment  1 application Nasal BID  . predniSONE  5 mg Oral Daily  . rOPINIRole  0.5 mg Oral QHS   Continuous Infusions: .  ferric gluconate (FERRLECIT/NULECIT) IV Stopped (05/16/17 1226)     LOS: 6 days      Romelo Sciandra Gerome Apley, MD [facility] Pager [phone]

## 2017-05-17 NOTE — Procedures (Signed)
Assessment/ Plan:   1. End stage renal disease-following progression  2. Bilateral leg swelling: secondary to volume overload  3. Hypertension:Blood pressure currently appears to be under fair control, monitor with UF/HD 4. Anemia of chronic disease: continue intravenous iron for correction of iron deficiency and continue to monitor on Aranesp-no indications for PRBC transfusion.  5. Morbid obesity: continue supportive management for obstructive sleep apnea-he declined CPAP 6. Secondary hyperparathyroidism:phosphorus level 5.4, PTH 261 7. LUE AVF functioning well  Tol HD, overall appears weak, will need PT eval. For mobilization [address]

## 2017-05-17 NOTE — Evaluation (Signed)
Physical Therapy Evaluation Patient Details Name: Noah Garner MRN: [id number] DOB: 07-Feb-1961 Today's Date: 05/17/2017   History of Present Illness  56 year old male presented with leg swelling. Patient does have history of systolic heart failure, chronic kidney disease stage IV, peripheral vascular disease, morbid obesity.  He was admitted with the working diagnosis of worsening extremity edema, volume overload, complicated by suspected cellulitis.  Patient did not respond to medical therapy, developed worsening uremia, started on hemodialysis.    Clinical Impression  Patient presents with decreased mobility due to pain, weakness, prolonged hospitalization and bedrest.  He is motivated to return home and hopeful his bilateral foot pain will improve and allow him to mobilize more independently.  Previously was independent with ADL's.  Feel he will benefit from skilled PT in the acute setting and CIR level rehab to allow return home with intermittent assist.     Follow Up Recommendations Supervision/Assistance - 24 hour;CIR    Equipment Recommendations  Other (comment) (TBA)    Recommendations for Other Services Rehab consult     Precautions / Restrictions Precautions Precautions: Fall      Mobility  Bed Mobility Overal bed mobility: Needs Assistance Bed Mobility: Rolling;Sidelying to Sit;Sit to Sidelying Rolling: Min assist Sidelying to sit: Min assist     Sit to sidelying: Min assist General bed mobility comments: assist to steady when coming upright, for guiding legs into bed to supine  Transfers                 General transfer comment: NT due to bilateral foot pain/swelling and fatigue from dialysis   Ambulation/Gait                Stairs            Wheelchair Mobility    Modified Rankin (Stroke Patients Only)       Balance Overall balance assessment: Needs assistance Sitting-balance support: Bilateral upper extremity supported;Feet  unsupported Sitting balance-Leahy Scale: Fair Sitting balance - Comments: sat EOB on airbed (needed UE support due to airmattress and feet not reaching floor)                                     Pertinent Vitals/Pain Pain Assessment: 0-10 Pain Score: 8  Pain Location: ankles when gets bumped Pain Descriptors / Indicators: Aching Pain Intervention(s): Monitored during session    Home Living Family/patient expects to be discharged to:: Private residence Living Arrangements: Other relatives (cousin) Available Help at Discharge: Available PRN/intermittently Type of Home: House Home Access: Stairs to enter Entrance Stairs-Rails: Psychiatric nurse of Steps: 2 Home Layout: One level Home Equipment: Clinical cytogeneticist - 2 wheels      Prior Function Level of Independence: Independent with assistive device(s)         Comments: uses walker every now and then like when has a gout flare     Hand Dominance   Dominant Hand: Right    Extremity/Trunk Assessment   Upper Extremity Assessment Upper Extremity Assessment: Generalized weakness    Lower Extremity Assessment Lower Extremity Assessment: RLE deficits/detail;LLE deficits/detail RLE Deficits / Details: Moves LE's antigravity well, but has pedal edema and very painful to touch so limited MMT RLE: Unable to fully assess due to pain LLE Deficits / Details: Moves LE's antigravity well, but has pedal edema and very painful to touch so limited MMT LLE: Unable to fully assess due to pain  Communication   Communication: No difficulties  Cognition Arousal/Alertness: Awake/alert Behavior During Therapy: WFL for tasks assessed/performed Overall Cognitive Status: Within Functional Limits for tasks assessed                                        General Comments      Exercises     Assessment/Plan    PT Assessment Patient needs continued PT services  PT Problem List  Decreased strength;Decreased mobility;Decreased balance;Decreased activity tolerance;Pain       PT Treatment Interventions DME instruction;Therapeutic activities;Therapeutic exercise;Patient/family education;Gait training;Functional mobility training;Stair training    PT Goals (Current goals can be found in the Care Plan section)  Acute Rehab PT Goals Patient Stated Goal: to return to independent PT Goal Formulation: With patient Time For Goal Achievement: 05/24/17 Potential to Achieve Goals: Good    Frequency Min 3X/week   Barriers to discharge        Co-evaluation               AM-PAC PT "6 Clicks" Daily Activity  Outcome Measure Difficulty turning over in bed (including adjusting bedclothes, sheets and blankets)?: Unable Difficulty moving from lying on back to sitting on the side of the bed? : Unable Difficulty sitting down on and standing up from a chair with arms (e.g., wheelchair, bedside commode, etc,.)?: Unable Help needed moving to and from a bed to chair (including a wheelchair)?: A Lot Help needed walking in hospital room?: A Lot Help needed climbing 3-5 steps with a railing? : A Lot 6 Click Score: 9    End of Session   Activity Tolerance: Patient limited by pain;Patient limited by fatigue Patient left: in bed;with call bell/phone within reach   PT Visit Diagnosis: Muscle weakness (generalized) (M62.81);Difficulty in walking, not elsewhere classified (R26.2);Pain Pain - Right/Left: Right (& left) Pain - part of body: Ankle and joints of foot    Time: 0340-3524 PT Time Calculation (min) (ACUTE ONLY): 19 min   Charges:   PT Evaluation $PT Eval Moderate Complexity: 1 Mod     PT G CodesMagda Kiel, [place] [phone] 05/17/2017   Reginia Naas 05/17/2017, 5:11 PM

## 2017-05-17 NOTE — Progress Notes (Signed)
Rehab Admissions Coordinator Note:  Patient was screened by Cleatrice Burke for appropriateness for an Inpatient Acute Rehab Consult per PT recommendation.  At this time, we are recommending Inpatient Rehab consult. Please place order for consult.  Cleatrice Burke 05/17/2017, 7:33 PM  I can be reached at [phone].

## 2017-05-17 NOTE — Progress Notes (Signed)
PT Cancellation Note  Patient Details Name: Noah Garner MRN: [id number] DOB: April 17, 1961   Cancelled Treatment:    Reason Eval/Treat Not Completed: Patient at procedure or test/unavailable. Pt at HD. PT to return as able.   [name] M [name] 05/17/2017, 10:10 AM   Kittie Plater, PT, DPT Pager #: [phone] Office #: [phone]

## 2017-05-18 DIAGNOSIS — I5043 Acute on chronic combined systolic (congestive) and diastolic (congestive) heart failure: Secondary | ICD-10-CM

## 2017-05-18 DIAGNOSIS — L089 Local infection of the skin and subcutaneous tissue, unspecified: Secondary | ICD-10-CM

## 2017-05-18 DIAGNOSIS — D62 Acute posthemorrhagic anemia: Secondary | ICD-10-CM

## 2017-05-18 DIAGNOSIS — D72829 Elevated white blood cell count, unspecified: Secondary | ICD-10-CM

## 2017-05-18 DIAGNOSIS — N184 Chronic kidney disease, stage 4 (severe): Secondary | ICD-10-CM

## 2017-05-18 DIAGNOSIS — L03119 Cellulitis of unspecified part of limb: Secondary | ICD-10-CM

## 2017-05-18 DIAGNOSIS — L97519 Non-pressure chronic ulcer of other part of right foot with unspecified severity: Secondary | ICD-10-CM

## 2017-05-18 DIAGNOSIS — I739 Peripheral vascular disease, unspecified: Secondary | ICD-10-CM

## 2017-05-18 DIAGNOSIS — I10 Essential (primary) hypertension: Secondary | ICD-10-CM

## 2017-05-18 DIAGNOSIS — Z992 Dependence on renal dialysis: Secondary | ICD-10-CM

## 2017-05-18 DIAGNOSIS — I83015 Varicose veins of right lower extremity with ulcer other part of foot: Secondary | ICD-10-CM

## 2017-05-18 DIAGNOSIS — E662 Morbid (severe) obesity with alveolar hypoventilation: Secondary | ICD-10-CM

## 2017-05-18 DIAGNOSIS — T148XXA Other injury of unspecified body region, initial encounter: Secondary | ICD-10-CM

## 2017-05-18 DIAGNOSIS — D638 Anemia in other chronic diseases classified elsewhere: Secondary | ICD-10-CM

## 2017-05-18 DIAGNOSIS — G4733 Obstructive sleep apnea (adult) (pediatric): Secondary | ICD-10-CM

## 2017-05-18 LAB — BASIC METABOLIC PANEL
Anion gap: 8 (ref 5–15)
BUN: 57 mg/dL — AB (ref 6–20)
CALCIUM: 9.3 mg/dL (ref 8.9–10.3)
CO2: 30 mmol/L (ref 22–32)
Chloride: 99 mmol/L — ABNORMAL LOW (ref 101–111)
Creatinine, Ser: 4.77 mg/dL — ABNORMAL HIGH (ref 0.61–1.24)
GFR calc Af Amer: 14 mL/min — ABNORMAL LOW (ref >60)
GFR calc non Af Amer: 12 mL/min — ABNORMAL LOW (ref >60)
Glucose, Bld: 153 mg/dL — ABNORMAL HIGH (ref 65–99)
POTASSIUM: 4.3 mmol/L (ref 3.5–5.1)
SODIUM: 137 mmol/L (ref 135–145)

## 2017-05-18 LAB — PARATHYROID HORMONE, INTACT (NO CA): PTH: 252 pg/mL — AB (ref 15–65)

## 2017-05-18 MED ORDER — CARVEDILOL 12.5 MG PO TABS
12.5000 mg | ORAL_TABLET | Freq: Two times a day (BID) | ORAL | Status: DC
  Administered 2017-05-19: 12.5 mg via ORAL
  Filled 2017-05-18: qty 1

## 2017-05-18 NOTE — Care Management Important Message (Signed)
Important Message  Patient Details  Name: Noah Garner MRN: [id number] Date of Birth: 12-Aug-1961   Medicare Important Message Given:  Yes    Lantz Hermann 05/18/2017, 1:08 PM

## 2017-05-18 NOTE — Progress Notes (Signed)
PROGRESS NOTE    Noah Garner  GHW:[id number] DOB: [birth year] DOA: 05/11/2017 PCP: Delilah Shan, MD    Brief Narrative:  56 year old male presented with leg swelling. Patient does have history of systolic heart failure, chronic kidney disease stage IV, peripheral vascular disease, morbid obesity. Patient reported worsening lower extremity edema for the last 10 days, associated with local pain and dyspnea. On initial physical examination blood pressure 113/68, heart rate 87, respiratory rate 20, temperature 98.9, saturation 95%. Moist mucous membranes, lungs were clear to auscultation bilaterally, no wheezing rales or rhonchi, heart S1-S2 present rhythmic, no gallops, abdomen was nontender and nondistended, positive lower extremity edema 2+ pitting.   Patient was admitted to the hospital with the working diagnosis of worsening extremity edema, volume overload, complicated by suspected cellulitis.   Patient did not respond to medical therapy, developed worsening uremia, started on hemodialysis. Waiting for outpatient dialysis unit.    Assessment & Plan:   Principal Problem:   Wound infection Active Problems:   Acute on chronic combined systolic and diastolic CHF (congestive heart failure) (HCC)   Hypertension   Stasis ulcer (HCC)   Morbid obesity with alveolar hypoventilation (HCC)   Rheumatoid arthritis (HCC)   Anemia of chronic disease   Leukocytosis   ESRD (end stage renal disease) (HCC)   Cellulitis   1. Volume overload with worsening renal function, AKI on CKD stage 4-5 with uremic symptoms. ESRD. Patient on a Monday-Wednesday_Friday schedule for HD, tolerating well renal replacement therapy, symptoms of uremia, edema and dyspnea have been improving. Patient with deconditioning, consulted inpatient rehab for possible transfer.   2. HTN. Blood pressure systolic in the 789'F, with HR 69 to 89, tolerating wellcoreg.   3. Anemia of chronic renal disease with iron  deficiency. Hb at 7,7 and 7,8, will follow H&H in am, if continue worsening may need prbc transfusion, ideally during HD. No signs of active bleeding, patient on Aranesp and had IV iron.   4. Morbid obesity. Continue DVT px. Patient with significant difficulty getting out of the bed, follow with inpatient rehab, recommendations.   5. Left leg ulceration. Clinically improving, will continue with local wound care.    DVT prophylaxis: heparin  Code Status:full  Family Communication:no family at the bedside Disposition Plan:home / snf   Consultants:  Nephrology   Procedures:    Antimicrobials:  Subjective: Patient feeling well, tolerating hd well, no dyspnea or chest pain. Right leg wound improved. Decreased edema. Continue to have difficulty moving out of the bed.   Objective: Vitals:   05/17/17 1535 05/17/17 1849 05/17/17 2116 05/18/17 0550  BP: 96/64 116/65 121/66 107/64  Pulse: 86 86 85 76  Resp:   17 18  Temp: 99.8 F (37.7 C)  99.7 F (37.6 C) 98.1 F (36.7 C)  TempSrc: Oral  Oral   SpO2: 98%  100% 100%  Weight:      Height:        Intake/Output Summary (Last 24 hours) at 05/18/17 1223 Last data filed at 05/18/17 0800  Gross per 24 hour  Intake              810 ml  Output              150 ml  Net              660 ml   Filed Weights   05/13/17 0605 05/14/17 0500 05/15/17 0601  Weight: (!) 181 kg (399 lb) (!) 197.3 kg (435 lb) Marland Kitchen)  173.7 kg (383 lb)    Examination:  General: Not in pain or dyspnea, deconditioned.  Neurology: Awake and alert, non focal  E ENT: no pallor, no icterus, oral mucosa moist Cardiovascular: S1-S2 present, rhythmic, no gallops, rubs, or murmurs. No jugular venous distention, ++ non pitting lower extremity edema. Pulmonary: vesicular breath sounds bilaterally, adequate air movement, no wheezing, rhonchi or rales. Gastrointestinal. Abdomen flat, no organomegaly, non tender, no rebound or guarding Skin. Right leg  lateral ulcerated wound with granulation tissue, no purulence or erythema.  Musculoskeletal: no joint deformities     Data Reviewed: I have personally reviewed following labs and imaging studies  CBC:  Recent Labs Lab 05/11/17 1915 05/12/17 0524 05/14/17 0553 05/16/17 0052 05/17/17 0821  WBC 18.8* 19.4* 17.0* 14.7* 17.7*  NEUTROABS 16.9*  --   --   --   --   HGB 9.2* 8.1* 8.4* 7.7* 7.8*  HCT 27.5* 24.8* 26.2* 23.4* 23.9*  MCV 80.9 80.5 81.9 79.6 80.7  PLT 238 253 275 308 923   Basic Metabolic Panel:  Recent Labs Lab 05/13/17 0323 05/14/17 0553 05/16/17 0052 05/17/17 0332 05/17/17 0821 05/18/17 0419  NA 139 139 137 138 137 137  K 3.7 4.1 3.5 3.6 3.7 4.3  CL 104 103 98* 99* 99* 99*  CO2 23 24 28 28 26 30   GLUCOSE 139* 118* 140* 137* 126* 153*  BUN 109* 114* 95* 74* 76* 57*  CREATININE 6.59* 6.80* 5.47* 4.94* 5.13* 4.77*  CALCIUM 8.7* 9.1 8.9 9.3 9.4 9.3  MG 2.3  --   --   --   --   --   PHOS 5.4*  --  5.1*  --  4.0  --    GFR: Estimated Creatinine Clearance: 28.1 mL/min (A) (by C-G formula based on SCr of 4.77 mg/dL (H)). Liver Function Tests:  Recent Labs Lab 05/11/17 1915 05/13/17 0323 05/14/17 1741 05/16/17 0052 05/17/17 0821  AST 17  --   --   --   --   ALT 26  --  23  --   --   ALKPHOS 84  --   --   --   --   BILITOT 1.2  --   --   --   --   PROT 8.3*  --   --   --   --   ALBUMIN 2.8* 2.3*  --  2.2* 2.3*   No results for input(s): LIPASE, AMYLASE in the last 168 hours. No results for input(s): AMMONIA in the last 168 hours. Coagulation Profile: No results for input(s): INR, PROTIME in the last 168 hours. Cardiac Enzymes: No results for input(s): CKTOTAL, CKMB, CKMBINDEX, TROPONINI in the last 168 hours. BNP (last 3 results) No results for input(s): PROBNP in the last 8760 hours. HbA1C: No results for input(s): HGBA1C in the last 72 hours. CBG: No results for input(s): GLUCAP in the last 168 hours. Lipid Profile: No results for input(s):  CHOL, HDL, LDLCALC, TRIG, CHOLHDL, LDLDIRECT in the last 72 hours. Thyroid Function Tests: No results for input(s): TSH, T4TOTAL, FREET4, T3FREE, THYROIDAB in the last 72 hours. Anemia Panel: No results for input(s): VITAMINB12, FOLATE, FERRITIN, TIBC, IRON, RETICCTPCT in the last 72 hours.    Radiology Studies: I have reviewed all of the imaging during this hospital visit personally     Scheduled Meds: . aspirin EC  81 mg Oral Daily  . carvedilol  50 mg Oral BID WC  . darbepoetin (ARANESP) injection - NON-DIALYSIS  200 mcg  Subcutaneous Q Thu-1800  . heparin  5,000 Units Subcutaneous Q8H  . hydrocerin  1 application Topical Daily  . polyethylene glycol  17 g Oral BID  . predniSONE  5 mg Oral Daily  . rOPINIRole  0.5 mg Oral QHS   Continuous Infusions:   LOS: 7 days     Naylah Cork Gerome Apley, MD [facility] Pager [phone]

## 2017-05-18 NOTE — NC FL2 (Addendum)
[place] LEVEL OF CARE SCREENING TOOL     IDENTIFICATION  Patient Name: Noah Garner Birthdate: 1961-03-11 Sex: male Admission Date (Current Location): 05/11/2017  [facility] and [place] Number:  Herbalist and Address:  The . [facility], [place] [address] [place], [place] [address]      Provider Number: [id number]  Attending Physician Name and Address:  Tawni Millers  Relative Name and Phone Number:       Current Level of Care: Hospital Recommended Level of Care: [place] Prior Approval Number:    Date Approved/Denied:   PASRR Number: [id number]   Discharge Plan: SNF    Current Diagnoses: Patient Active Problem List   Diagnosis Date Noted  . Leukocytosis 05/12/2017  . ESRD (end stage renal disease) ([place]) 05/12/2017  . Cellulitis 05/12/2017  . Wound infection 05/11/2017  . Anemia of chronic disease 04/09/2016  . Edema 09/16/2015  . Unstable gait 09/16/2015  . Anemia 09/16/2015  . Hyperglycemia 09/16/2015  . Chronic systolic heart failure ([place]) 08/29/2015  . Chronic diastolic heart failure ([place])   . Rheumatoid arthritis ([place])   . Fall at home   . Morbid obesity with alveolar hypoventilation ([place]) 08/15/2015  . Debilitated patient 08/15/2015  . NICM (nonischemic cardiomyopathy) ([place]) 08/15/2015  . Acute respiratory failure ([place])   . AKI (acute kidney injury) ([place]) 08/11/2015  . Acute on chronic combined systolic and diastolic CHF (congestive heart failure) ([employer]) 08/11/2015  . Hypertension 08/11/2015  . Arthritis 08/11/2015  . Stasis ulcer ([place]) 08/11/2015  . Acute back pain   . Weakness of both legs   . Falls   . Ganglion cyst of wrist 07/16/2015  . Inflammation of multiple joints 07/16/2015  . Influenza vaccination declined 07/16/2015  . Consent not given for pneumococcal immunization 07/16/2015  . Chronic kidney disease 03/12/2015  . Congestive cardiomyopathy ([place]) 03/12/2015     Orientation RESPIRATION BLADDER Height & Weight     Self, Time, Situation, Place  O2, Other (Comment) (2L [place] during day, home CPAP at night) Continent Weight:  (bed scale not working) Height:  5\' 11"  (180.3 cm)  BEHAVIORAL SYMPTOMS/MOOD NEUROLOGICAL BOWEL NUTRITION STATUS      Continent Diet (see DC summary)  AMBULATORY STATUS COMMUNICATION OF NEEDS Skin   Extensive Assist Verbally Normal                       Personal Care Assistance Level of Assistance  Bathing, Dressing Bathing Assistance: Maximum assistance   Dressing Assistance: Maximum assistance     Functional Limitations Info             SPECIAL CARE FACTORS FREQUENCY  PT (By licensed PT), OT (By licensed OT)     PT Frequency: 5/wk OT Frequency: 5/wk            Contractures      Additional Factors Info  Code Status, Allergies, Isolation Precautions Code Status Info: FULL Allergies Info: NKA     Isolation Precautions Info: MRSA     Current Medications (05/18/2017):  This is the current hospital active medication list Current Facility-Administered Medications  Medication Dose Route Frequency Provider Last Rate Last Dose  . acetaminophen (TYLENOL) tablet 650 mg  650 mg Oral Q6H PRN Fuller Plan A, MD       Or  . acetaminophen (TYLENOL) suppository 650 mg  650 mg Rectal Q6H PRN Norval Morton, MD      . aspirin EC  tablet 81 mg  81 mg Oral Daily Fuller Plan A, MD   81 mg at 05/18/17 0859  . carvedilol (COREG) tablet 50 mg  50 mg Oral BID WC Smith, Rondell A, MD   50 mg at 05/18/17 0858  . Darbepoetin Alfa (ARANESP) injection 200 mcg  200 mcg Subcutaneous Q Thu-1800 Elmarie Shiley, MD   200 mcg at 05/13/17 1721  . heparin injection 5,000 Units  5,000 Units Subcutaneous Q8H Fuller Plan A, MD   5,000 Units at 05/18/17 5789  . hydrocerin (EUCERIN) cream 1 application  1 application Topical Daily Arrien, Jimmy Picket, MD   1 application at 78/47/84 1341  . ondansetron (ZOFRAN) tablet 4 mg  4 mg  Oral Q6H PRN Fuller Plan A, MD       Or  . ondansetron (ZOFRAN) injection 4 mg  4 mg Intravenous Q6H PRN Smith, Rondell A, MD      . polyethylene glycol (MIRALAX / GLYCOLAX) packet 17 g  17 g Oral BID Tawni Millers, MD   17 g at 05/18/17 1282  . predniSONE (DELTASONE) tablet 5 mg  5 mg Oral Daily Smith, Rondell A, MD   5 mg at 05/18/17 0900  . rOPINIRole (REQUIP) tablet 0.5 mg  0.5 mg Oral QHS Elmarie Shiley, MD   0.5 mg at 05/17/17 2229     Discharge Medications: Please see discharge summary for a list of discharge medications.  Relevant Imaging Results:  Relevant Lab Results:   Additional Information SS#: [id number]; starting dialysis MWF scheduled- dialysis center dependent on which SNF he discharges to  [place], [employer], LCSW

## 2017-05-18 NOTE — Consult Note (Signed)
Physical Medicine and Rehabilitation Consult  Reason for Consult: Debility with deficits in mobility Referring Physician: Dr. Cathlean Sauer   HPI: Noah Garner is a 56 y.o. male with history of CKD-IV, systolic CHF, PVD, OSA, morbid obesity who was admitted on 05/16/17 with 1-2 weeks history of increase in BLE edema with pain and dyspnea. He was started on IV diuresis to help manage volume overload and LLE ulcers managed with local care. He continued to have worsening of renal status with signs of uremia and HD initiated due to progression of renal disease. Anemia of chronic disease treated with IV iron and aranesp. PT evaluation done yesterday revealing limitations in mobility. CIR recommended for follow up therapy.    Review of Systems  Respiratory: Positive for shortness of breath.   Gastrointestinal: Positive for constipation.  Neurological: Positive for sensory change.  All other systems reviewed and are negative.     Past Medical History:  Diagnosis Date  . Anemia   . Arthritis   . CHF (congestive heart failure) ([place])   . Chronic kidney disease    stage 4  . Chronic systolic heart failure ([place]) 08/29/2015  . Hypertension   . Morbid obesity ([place])   . Rheumatoid arthritis ([place])   . Sleep apnea    wears CPAP    Past Surgical History:  Procedure Laterality Date  . AV FISTULA PLACEMENT Left 07/09/2016   Procedure: CREATION OF LEFT RADIOCEPHALIC ARTERIOVENOUS (AV) FISTULA;  Surgeon: Waynetta Sandy, MD;  Location: [place];  Service: Vascular;  Laterality: Left;  . CARDIAC CATHETERIZATION     12/18/11: No sign CAD, elevated left heart pressures ([facility])  . KNEE SURGERY     torn ligaments    Family History  Problem Relation Age of Onset  . Hypertension Mother     Social History:  reports that he quit smoking about 21 years ago. He has never used smokeless tobacco. He reports that he does not drink alcohol or use drugs.    Allergies: No Known Allergies      Medications Prior to Admission  Medication Sig Dispense Refill  . aspirin EC 81 MG tablet Take 81 mg by mouth daily.    . carvedilol (COREG) 25 MG tablet Take 50 mg by mouth 2 (two) times daily.    . furosemide (LASIX) 40 MG tablet Take 40 mg by mouth 2 (two) times daily.    . predniSONE (DELTASONE) 5 MG tablet Take 5 mg by mouth daily.     . hydrALAZINE (APRESOLINE) 25 MG tablet Take 1 tablet (25 mg total) by mouth every 8 (eight) hours. (Patient not taking: Reported on 05/12/2017) 90 tablet 0  . oxyCODONE-acetaminophen (PERCOCET/ROXICET) 5-325 MG tablet Take 1 tablet by mouth every 6 (six) hours as needed. (Patient not taking: Reported on 05/12/2017) 6 tablet 0    Home: Home Living Family/patient expects to be discharged to:: Private residence Living Arrangements: Other relatives (cousin) Available Help at Discharge: Available PRN/intermittently Type of Home: House Home Access: Stairs to enter Technical brewer of Steps: 2 Entrance Stairs-Rails: Right, Left Home Layout: One level Bathroom Shower/Tub: Chiropodist: Standard Home Equipment: Civil engineer, contracting, Environmental consultant - 2 wheels  Functional History: Prior Function Level of Independence: Independent with assistive device(s) Comments: uses walker every now and then like when has a gout flare Functional Status:  Mobility: Bed Mobility Overal bed mobility: Needs Assistance Bed Mobility: Rolling, Sidelying to Sit, Sit to Sidelying Rolling: Min assist Sidelying to  sit: Min assist Sit to sidelying: Min assist General bed mobility comments: assist to steady when coming upright, for guiding legs into bed to supine Transfers General transfer comment: NT due to bilateral foot pain/swelling and fatigue from dialysis       ADL:    Cognition: Cognition Overall Cognitive Status: Within Functional Limits for tasks assessed Orientation Level: Oriented X4 Cognition Arousal/Alertness: Awake/alert Behavior During  Therapy: WFL for tasks assessed/performed Overall Cognitive Status: Within Functional Limits for tasks assessed   Blood pressure 107/64, pulse 76, temperature 98.1 F (36.7 C), resp. rate 18, height 5\' 11"  (1.803 m), weight (!) 173.7 kg (383 lb), SpO2 100 %. Physical Exam  Vitals reviewed. Constitutional: He is oriented to person, place, and time. He appears well-developed.  Obese  HENT:  Head: Normocephalic and atraumatic.  Eyes: EOM are normal. Right eye exhibits no discharge. Left eye exhibits no discharge.  Neck: Normal range of motion. Neck supple. No thyromegaly present.  Cardiovascular: Normal rate and regular rhythm.   Respiratory: Effort normal.  Decreased at bases +[place]  GI: Soft. Bowel sounds are normal.  Musculoskeletal: He exhibits edema. He exhibits no tenderness.  Neurological: He is alert and oriented to person, place, and time.  Motor: B/l UE 4-/5 proximal to distal B/l LE: HF, KE 4-/5, ADF/PF 3+/5 Sensation diminished to light touch b/l feet  Skin:  Vascular changes b/l LE LLE distally dressed  Psychiatric: He has a normal mood and affect. His behavior is normal.    Results for orders placed or performed during the hospital encounter of 05/11/17 (from the past 24 hour(s))  Basic metabolic panel     Status: Abnormal   Collection Time: 05/18/17  4:19 AM  Result Value Ref Range   Sodium 137 135 - 145 mmol/L   Potassium 4.3 3.5 - 5.1 mmol/L   Chloride 99 (L) 101 - 111 mmol/L   CO2 30 22 - 32 mmol/L   Glucose, Bld 153 (H) 65 - 99 mg/dL   BUN 57 (H) 6 - 20 mg/dL   Creatinine, Ser 4.77 (H) 0.61 - 1.24 mg/dL   Calcium 9.3 8.9 - 10.3 mg/dL   GFR calc non Af Amer 12 (L) >60 mL/min   GFR calc Af Amer 14 (L) >60 mL/min   Anion gap 8 5 - 15   No results found.  Assessment/Plan: Diagnosis: Debility  Labs independently reviewed.  Records reviewed and summated above.  1. Does the need for close, 24 hr/day medical supervision in concert with the patient's rehab  needs make it unreasonable for this patient to be served in a less intensive setting? Potentially 2. Co-Morbidities requiring supervision/potential complications: CKD-IV now requiring HD (recs per Nephro), systolic CHF (Monitor in accordance with increased physical activity and avoid UE resistance excercises), PVD (meds), OSA (monitor for daytime somnolence), morbid obesity (Body mass index is 53.42 kg/m., diet and exercise education, encourage weight loss to increase endurance and promote overall health), ABLA on Anemia of chronic disease (transfuse if necessary to ensure appropriate perfusion for increased activity tolerance), leukocytosis (cont to monitor for signs and symptoms of infection, further workup if indicated)  3. Due to safety, skin/wound care, disease management, pain management and patient education, does the patient require 24 hr/day rehab nursing? Yes 4. Does the patient require coordinated care of a physician, rehab nurse, PT (1-2 hrs/day, 5 days/week) and OT (1-2 hrs/day, 5 days/week) to address physical and functional deficits in the context of the above medical diagnosis(es)? Yes Addressing deficits in  the following areas: balance, endurance, locomotion, strength, transferring, bathing, dressing, toileting and psychosocial support 5. Can the patient actively participate in an intensive therapy program of at least 3 hrs of therapy per day at least 5 days per week? Potentially 6. The potential for patient to make measurable gains while on inpatient rehab is good 7. Anticipated functional outcomes upon discharge from inpatient rehab are supervision  with PT, supervision with OT, n/a with SLP. 8. Estimated rehab length of stay to reach the above functional goals is: 12-16 days. 9. Anticipated D/C setting: Home 10. Anticipated post D/C treatments: HH therapy and Home excercise program 11. Overall Rehab/Functional Prognosis: good  RECOMMENDATIONS: This patient's condition is  appropriate for continued rehabilitative care in the following setting: Will need more thorough evals from therapy.  If deficits persist, will consider CIR when patient able to tolerate 3 hours therapy/day. Patient has agreed to participate in recommended program. Potentially Note that insurance prior authorization may be required for reimbursement for recommended care.  Comment: Rehab Admissions Coordinator to follow up.  Delice Lesch, MD, [place], [place] 05/18/2017

## 2017-05-18 NOTE — Clinical Social Work Note (Signed)
Clinical Social Work Assessment  Patient Details  Name: Noah Garner MRN: [id number] Date of Birth: Dec 01, 1960  Date of referral:  05/18/17               Reason for consult:  Facility Placement                Permission sought to share information with:  Facility Art therapist granted to share information::  Yes, Verbal Permission Granted  Name::        Agency::  SNF  Relationship::     Contact Information:     Housing/Transportation Living arrangements for the past 2 months:  Single Family Home Source of Information:  Patient Patient Interpreter Needed:  None Criminal Activity/Legal Involvement Pertinent to Current Situation/Hospitalization:  No - Comment as needed Significant Relationships:  Other Family Members Lives with:  Other (Comment) (cousin) Do you feel safe going back to the place where you live?  No Need for family participation in patient care:  No (Coment)  Care giving concerns:  Pt lives at home with cousin who does not provide physical assist at home and is not available 24hr/day to assist with pt mobility.   Social Worker assessment / plan:  CSW spoke with pt concerning PT recommendation for SNF.  Pt has been in the past and understands referral process and rehab set up.  Employment status:  Disabled (Comment on whether or not currently receiving Disability) Insurance information:  Medicare PT Recommendations:  [place] / Referral to community resources:  [place]  Patient/Family's Response to care:  Pt agreeable to SNF placement at this time but hopeful for better options than were available last time he went.  Patient/Family's Understanding of and Emotional Response to Diagnosis, Current Treatment, and Prognosis:  Pt seems to have good understanding of current needs and is hopeful for short rehab stay that will bring him back to baseline.  Emotional Assessment Appearance:  Appears stated  age Attitude/Demeanor/Rapport:    Affect (typically observed):  Accepting, Appropriate Orientation:  Oriented to Situation, Oriented to  Time, Oriented to Place, Oriented to Self Alcohol / Substance use:  Not Applicable Psych involvement (Current and /or in the community):  No (Comment)  Discharge Needs  Concerns to be addressed:  Care Coordination Readmission within the last 30 days:  No Current discharge risk:  Physical Impairment Barriers to Discharge:  Continued Medical Work up   Noah Ny, LCSW 05/18/2017, 2:52 PM

## 2017-05-18 NOTE — Progress Notes (Signed)
Physical Therapy Treatment Patient Details Name: Noah Garner MRN: [id number] DOB: 30-Jul-1961 Today's Date: 05/18/2017    History of Present Illness 56 year old male presented with leg swelling. Patient does have history of systolic heart failure, chronic kidney disease stage IV, peripheral vascular disease, morbid obesity.  He was admitted with the working diagnosis of worsening extremity edema, volume overload, complicated by suspected cellulitis.  Patient did not respond to medical therapy, developed worsening uremia, started on hemodialysis.      PT Comments    Pt performed sitting to edge of bed but refused to progress to standing secondary to pain in B LEs R>L.  Pt returned to supine after sitting x 10 min and performed LE exercise in supine.  Pt continues to benefit from rehab in a post acute setting for medical management and continued aggressive therapies to improve function and strength before returning home.    Follow Up Recommendations  Supervision/Assistance - 24 hour;CIR     Equipment Recommendations  Other (comment) (TBA at next venue)    Recommendations for Other Services Rehab consult     Precautions / Restrictions Precautions Precautions: Fall Restrictions Weight Bearing Restrictions: No    Mobility  Bed Mobility Overal bed mobility: Needs Assistance Bed Mobility: Supine to Sit;Sit to Supine;Rolling Rolling: Min guard (Pt able to roll to R and L for perianal care.  )   Supine to sit: Min assist Sit to supine: Min assist   General bed mobility comments: assist to steady when coming upright, for guiding legs into bed to supine, Pt required +2 max assist to scoot to [facility] post transfer back to bed.  Pt able to sit x 10 min while NT washed patient's back and chest.  Pt sitting predominantly on L hip and he reports R foot is too painful to reach the floor.    Transfers                 General transfer comment: Pt refused to stand due to pain in R foot.     Ambulation/Gait                 Stairs            Wheelchair Mobility    Modified Rankin (Stroke Patients Only)       Balance     Sitting balance-Leahy Scale: Fair Sitting balance - Comments: Pt sat x 10 min on airbed remains to require UE support due to refusal to put both feet on the floor.  Pt only with L foot on floor and poor position of pelvis to maintain sitting unsupported.                                      Cognition Arousal/Alertness: Awake/alert Behavior During Therapy: WFL for tasks assessed/performed Overall Cognitive Status: Within Functional Limits for tasks assessed                                        Exercises General Exercises - Lower Extremity Ankle Circles/Pumps: AROM;Both;10 reps;Supine Quad Sets: AROM;Both;10 reps;Supine Heel Slides: AROM;Both;10 reps;Supine (Pt with R hip ER due to pain but able to extend and flex R knee in this position.  ) Hip ABduction/ADduction: AROM;Both;10 reps;Supine    General Comments        Pertinent Vitals/Pain Pain  Assessment: Faces Faces Pain Scale: Hurts even more Pain Location: ankles when gets bumped Pain Descriptors / Indicators: Aching;Grimacing;Guarding Pain Intervention(s): Monitored during session;Repositioned    Home Living                      Prior Function            PT Goals (current goals can now be found in the care plan section) Acute Rehab PT Goals Patient Stated Goal: to return to independent Potential to Achieve Goals: Good Progress towards PT goals: Progressing toward goals    Frequency    Min 3X/week      PT Plan Current plan remains appropriate    Co-evaluation              AM-PAC PT "6 Clicks" Daily Activity  Outcome Measure  Difficulty turning over in bed (including adjusting bedclothes, sheets and blankets)?: Unable Difficulty moving from lying on back to sitting on the side of the bed? :  Unable Difficulty sitting down on and standing up from a chair with arms (e.g., wheelchair, bedside commode, etc,.)?: Unable Help needed moving to and from a bed to chair (including a wheelchair)?: Total Help needed walking in hospital room?: Total Help needed climbing 3-5 steps with a railing? : Total 6 Click Score: 6    End of Session Equipment Utilized During Treatment: Gait belt Activity Tolerance: Patient limited by pain;Patient limited by fatigue Patient left: in bed;with call bell/phone within reach Nurse Communication: Mobility status PT Visit Diagnosis: Muscle weakness (generalized) (M62.81);Difficulty in walking, not elsewhere classified (R26.2);Pain Pain - Right/Left: Right (& left) Pain - part of body: Ankle and joints of foot     Time: 1450-1517 PT Time Calculation (min) (ACUTE ONLY): 27 min  Charges:  $Therapeutic Exercise: 8-22 mins $Therapeutic Activity: 8-22 mins                    G Codes:       Governor Rooks, PTA pager [phone]    Cristela Blue 05/18/2017, 3:28 PM

## 2017-05-18 NOTE — Consult Note (Signed)
Assessment/ Plan:   1. End stage renal disease-following progression  2. Bilateral leg swelling:secondary to volume overload  3. Hypertension:Blood pressure now low, reduce Coreg 4. Morbid obesity: & obstructive sleep apnea-he declined CPAP 5. LUE AVF functioning well  Subjective: Interval History:pain in Legs when standing  Objective: Vital signs in last 24 hours: Temp:  [97.4 F (36.3 C)-98.4 F (36.9 C)] 98.4 F (36.9 C) (09/04 2038) Pulse Rate:  [73-76] 76 (09/04 2038) Resp:  [17-18] 18 (09/04 2038) BP: (107-110)/(64-77) 110/77 (09/04 1344) SpO2:  [96 %-100 %] 96 % (09/04 2038) Weight change:   Intake/Output from previous day: 09/03 0701 - 09/04 0700 In: 570 [P.O.:570] Out: 2643 [Urine:350] Intake/Output this shift: Total I/O In: 60 [P.O.:60] Out: 0   General appearance: alert and cooperative Resp: clear to auscultation bilaterally Chest wall: no tenderness Cardio: regular rate and rhythm, S1, S2 normal, no murmur, click, rub or gallop Extremities: chronic changes with cobblestoning  Lab Results:  Recent Labs  05/16/17 0052 05/17/17 0821  WBC 14.7* 17.7*  HGB 7.7* 7.8*  HCT 23.4* 23.9*  PLT 308 334   BMET:  Recent Labs  05/17/17 0821 05/18/17 0419  NA 137 137  K 3.7 4.3  CL 99* 99*  CO2 26 30  GLUCOSE 126* 153*  BUN 76* 57*  CREATININE 5.13* 4.77*  CALCIUM 9.4 9.3    Recent Labs  05/16/17 0359  PTH 252*   Iron Studies: No results for input(s): IRON, TIBC, TRANSFERRIN, FERRITIN in the last 72 hours. Studies/Results: No results found.  Scheduled: . aspirin EC  81 mg Oral Daily  . carvedilol  50 mg Oral BID WC  . darbepoetin (ARANESP) injection - NON-DIALYSIS  200 mcg Subcutaneous Q Thu-1800  . heparin  5,000 Units Subcutaneous Q8H  . hydrocerin  1 application Topical Daily  . polyethylene glycol  17 g Oral BID  . predniSONE  5 mg Oral Daily  . rOPINIRole  0.5 mg Oral QHS    LOS: 7 days   Shafin Pollio C 05/18/2017,10:08 PM

## 2017-05-19 LAB — CBC WITH DIFFERENTIAL/PLATELET
BASOS PCT: 0 %
Basophils Absolute: 0 10*3/uL (ref 0.0–0.1)
EOS ABS: 0.2 10*3/uL (ref 0.0–0.7)
Eosinophils Relative: 1 %
HCT: 24.7 % — ABNORMAL LOW (ref 39.0–52.0)
HEMOGLOBIN: 7.7 g/dL — AB (ref 13.0–17.0)
Lymphocytes Relative: 14 %
Lymphs Abs: 2.3 10*3/uL (ref 0.7–4.0)
MCH: 25.8 pg — ABNORMAL LOW (ref 26.0–34.0)
MCHC: 31.2 g/dL (ref 30.0–36.0)
MCV: 82.9 fL (ref 78.0–100.0)
Monocytes Absolute: 1.5 10*3/uL — ABNORMAL HIGH (ref 0.1–1.0)
Monocytes Relative: 9 %
NEUTROS PCT: 76 %
Neutro Abs: 13.1 10*3/uL — ABNORMAL HIGH (ref 1.7–7.7)
Platelets: 307 10*3/uL (ref 150–400)
RBC: 2.98 MIL/uL — AB (ref 4.22–5.81)
RDW: 16 % — ABNORMAL HIGH (ref 11.5–15.5)
WBC: 17.1 10*3/uL — AB (ref 4.0–10.5)

## 2017-05-19 LAB — BASIC METABOLIC PANEL
Anion gap: 9 (ref 5–15)
BUN: 75 mg/dL — ABNORMAL HIGH (ref 6–20)
CALCIUM: 9.5 mg/dL (ref 8.9–10.3)
CO2: 29 mmol/L (ref 22–32)
CREATININE: 5.89 mg/dL — AB (ref 0.61–1.24)
Chloride: 100 mmol/L — ABNORMAL LOW (ref 101–111)
GFR calc non Af Amer: 10 mL/min — ABNORMAL LOW (ref >60)
GFR, EST AFRICAN AMERICAN: 11 mL/min — AB (ref >60)
Glucose, Bld: 127 mg/dL — ABNORMAL HIGH (ref 65–99)
Potassium: 4.7 mmol/L (ref 3.5–5.1)
SODIUM: 138 mmol/L (ref 135–145)

## 2017-05-19 MED ORDER — SODIUM CHLORIDE 0.9 % IV SOLN: 100.0000 mL | INTRAVENOUS | Status: DC | PRN

## 2017-05-19 MED ORDER — HEPARIN SODIUM (PORCINE) 1000 UNIT/ML DIALYSIS: 20.0000 [IU]/kg | INTRAMUSCULAR | Status: DC | PRN

## 2017-05-19 MED ORDER — HEPARIN SODIUM (PORCINE) 1000 UNIT/ML DIALYSIS: 1000.0000 [IU] | INTRAMUSCULAR | Status: DC | PRN

## 2017-05-19 MED ORDER — LIDOCAINE-PRILOCAINE 2.5-2.5 % EX CREA: 1.0000 "application " | TOPICAL_CREAM | CUTANEOUS | Status: DC | PRN

## 2017-05-19 MED ORDER — LIDOCAINE HCL (PF) 1 % IJ SOLN: 5.0000 mL | INTRAMUSCULAR | Status: DC | PRN

## 2017-05-19 MED ORDER — PENTAFLUOROPROP-TETRAFLUOROETH EX AERO: 1.0000 "application " | INHALATION_SPRAY | CUTANEOUS | Status: DC | PRN

## 2017-05-19 MED ORDER — SENNA 8.6 MG PO TABS
1.0000 | ORAL_TABLET | Freq: Every day | ORAL | Status: DC
  Administered 2017-05-19 – 2017-05-24 (×6): 8.6 mg via ORAL
  Filled 2017-05-19 (×6): qty 1

## 2017-05-19 MED ORDER — BISACODYL 10 MG RE SUPP
10.0000 mg | Freq: Every day | RECTAL | Status: DC | PRN
  Filled 2017-05-19: qty 1

## 2017-05-19 NOTE — Progress Notes (Signed)
Patient refused CPAP for tonight 

## 2017-05-19 NOTE — Progress Notes (Signed)
PROGRESS NOTE    Noah Garner  HKV:[id number] DOB: 06-04-1961 DOA: 05/11/2017 PCP: Delilah Shan, MD   Brief Narrative: (Start on day 1 of progress note - keep it brief and live) 56 year old male presented with leg swelling. Patient does have history of systolic heart failure, chronic kidney disease stage IV, peripheral vascular disease, morbid obesity. Patient reported worsening lower extremity edema for the last 10 days, associated with local pain and dyspnea. On initial physical examination blood pressure 113/68, heart rate 87, respiratory rate 20, temperature 98.9, saturation 95%. Moist mucous membranes, lungs were clear to auscultation bilaterally, no wheezing rales or rhonchi, heart S1-S2 present rhythmic, no gallops, abdomen was nontender and nondistended, positive lower extremity edema 2+ pitting.   Patient was admitted to the hospital with the working diagnosis of worsening extremity edema, volume overload, complicated by suspected cellulitis.   Patient did not respond to medical therapy, developed worsening uremia, started on hemodialysis. Waiting for outpatient dialysis unit.    Assessment & Plan:   Principal Problem:   Wound infection Active Problems:   Acute on chronic combined systolic and diastolic CHF (congestive heart failure) (HCC)   Hypertension   Stasis ulcer (HCC)   Morbid obesity with alveolar hypoventilation (HCC)   Rheumatoid arthritis (HCC)   Anemia of chronic disease   Leukocytosis   ESRD (end stage renal disease) (HCC)   Cellulitis   Chronic renal insufficiency, stage 4 (severe) (HCC)   PVD (peripheral vascular disease) (HCC)   OSA (obstructive sleep apnea)   Dependence on renal dialysis ([place])   Acute blood loss anemia   1. Volume overload with worsening renal function, AKI on CKD stage 4-5 with uremic symptoms. ESRD. Patient on Noah Garner Monday-Wednesday_Friday schedule for HD, tolerating well renal replacement therapy, symptoms of uremia, edema and  dyspnea have been improving. Patient with deconditioning, consulted inpatient rehab for possible transfer.   2. HTN. Blood pressure systolic in the 564'P, with HR 69 to 89, tolerating wellcoreg.   3. Anemia of chronic renal disease with iron deficiency. Stable to 7.7 today.  If continue worsening may need prbc transfusion, ideally during HD. No signs of active bleeding, patient on Aranesp and had IV iron.   4. Morbid obesity. Continue DVTpx. Patient with significant difficulty getting out of the bed, follow with inpatient rehab, recommendations.   5. Left leg ulceration. Clinically improving, will continue with local wound care.  6. Leukocytosis: stably elevated.  CTM. He doesn't seem to have any signs or symptoms of systemic infection at this time.      DVT prophylaxis: heparin Code Status: full Family Communication: none Disposition Plan: home vs snf  Consultants:   nephrology  Procedures: (Don't include imaging studies which can be auto populated. Include things that cannot be auto populated i.e. Echo, Carotid and venous dopplers, Foley, Bipap, HD, tubes/drains, wound vac, central lines etc)  hemodialysis  Antimicrobials: (specify start and planned stop date. Auto populated tables are space occupying and do not give end dates) Anti-infectives    Start     Dose/Rate Route Frequency Ordered Stop   05/12/17 0800  piperacillin-tazobactam (ZOSYN) IVPB 3.375 g     3.375 g 100 mL/hr over 30 Minutes Intravenous  Once 05/11/17 2357 05/12/17 0910   05/12/17 0015  vancomycin (VANCOCIN) 1,500 mg in sodium chloride 0.9 % 500 mL IVPB     1,500 mg 250 mL/hr over 120 Minutes Intravenous  Once 05/12/17 0012 05/12/17 0536   05/11/17 2315  cefTRIAXone (ROCEPHIN) 1 g in dextrose  5 % 50 mL IVPB     1 g 100 mL/hr over 30 Minutes Intravenous  Once 05/11/17 2307 05/12/17 0022   05/11/17 2315  vancomycin (VANCOCIN) IVPB 1000 mg/200 mL premix     1,000 mg 200 mL/hr over 60 Minutes Intravenous   Once 05/11/17 2307 05/12/17 0045          Subjective: S/p dialysis.  Feeling normal.  No CP, SOB.    Objective: Vitals:   05/19/17 1530 05/19/17 1600 05/19/17 1630 05/19/17 1705  BP: (!) 98/56 (!) 94/45 (!) 97/55 (!) 95/57  Pulse: 66 75 75 72  Resp:    17  Temp:    98.2 F (36.8 C)  TempSrc:    Oral  SpO2:    100%  Weight:      Height:        Intake/Output Summary (Last 24 hours) at 05/19/17 1842 Last data filed at 05/19/17 1705  Gross per 24 hour  Intake              360 ml  Output             4145 ml  Net            -3785 ml   Filed Weights   05/15/17 0601 05/19/17 0613  Weight: (!) 173.7 kg (383 lb) (!) 169.2 kg (373 lb)    Examination:  General exam: Appears calm and comfortable  Respiratory system: Clear to auscultation. Respiratory effort normal. Cardiovascular system: S1 & S2 heard, RRR. No JVD, murmurs, rubs, gallops or clicks.  Gastrointestinal system: Abdomen is nondistended, soft and nontender. No organomegaly or masses felt. Normal bowel sounds heard. Central nervous system: Alert and oriented. No focal neurological deficits. Extremities: moving all extremieis Skin: Bilateral edematous LE's, 2+, ulceration to L lower leg, lateral without purulence or erythema Psychiatry: Judgement and insight appear normal. Mood & affect appropriate.     Data Reviewed: I have personally reviewed following labs and imaging studies  CBC:  Recent Labs Lab 05/14/17 0553 05/16/17 0052 05/17/17 0821 05/19/17 0409  WBC 17.0* 14.7* 17.7* 17.1*  NEUTROABS  --   --   --  13.1*  HGB 8.4* 7.7* 7.8* 7.7*  HCT 26.2* 23.4* 23.9* 24.7*  MCV 81.9 79.6 80.7 82.9  PLT 275 308 334 440   Basic Metabolic Panel:  Recent Labs Lab 05/13/17 0323  05/16/17 0052 05/17/17 0332 05/17/17 0821 05/18/17 0419 05/19/17 0409  NA 139  < > 137 138 137 137 138  K 3.7  < > 3.5 3.6 3.7 4.3 4.7  CL 104  < > 98* 99* 99* 99* 100*  CO2 23  < > 28 28 26 30 29   GLUCOSE 139*  < > 140*  137* 126* 153* 127*  BUN 109*  < > 95* 74* 76* 57* 75*  CREATININE 6.59*  < > 5.47* 4.94* 5.13* 4.77* 5.89*  CALCIUM 8.7*  < > 8.9 9.3 9.4 9.3 9.5  MG 2.3  --   --   --   --   --   --   PHOS 5.4*  --  5.1*  --  4.0  --   --   < > = values in this interval not displayed. GFR: Estimated Creatinine Clearance: 22.4 mL/min (Noah Garner) (by C-G formula based on SCr of 5.89 mg/dL (H)). Liver Function Tests:  Recent Labs Lab 05/13/17 0323 05/14/17 1741 05/16/17 0052 05/17/17 0821  ALT  --  23  --   --  ALBUMIN 2.3*  --  2.2* 2.3*   No results for input(s): LIPASE, AMYLASE in the last 168 hours. No results for input(s): AMMONIA in the last 168 hours. Coagulation Profile: No results for input(s): INR, PROTIME in the last 168 hours. Cardiac Enzymes: No results for input(s): CKTOTAL, CKMB, CKMBINDEX, TROPONINI in the last 168 hours. BNP (last 3 results) No results for input(s): PROBNP in the last 8760 hours. HbA1C: No results for input(s): HGBA1C in the last 72 hours. CBG: No results for input(s): GLUCAP in the last 168 hours. Lipid Profile: No results for input(s): CHOL, HDL, LDLCALC, TRIG, CHOLHDL, LDLDIRECT in the last 72 hours. Thyroid Function Tests: No results for input(s): TSH, T4TOTAL, FREET4, T3FREE, THYROIDAB in the last 72 hours. Anemia Panel: No results for input(s): VITAMINB12, FOLATE, FERRITIN, TIBC, IRON, RETICCTPCT in the last 72 hours. Sepsis Labs: No results for input(s): PROCALCITON, LATICACIDVEN in the last 168 hours.  Recent Results (from the past 240 hour(s))  Culture, blood (routine x 2)     Status: None   Collection Time: 05/11/17  7:24 PM  Result Value Ref Range Status   Specimen Description BLOOD RIGHT ANTECUBITAL  Final   Special Requests   Final    BOTTLES DRAWN AEROBIC AND ANAEROBIC Blood Culture adequate volume   Culture NO GROWTH 5 DAYS  Final   Report Status 05/17/2017 FINAL  Final  Culture, blood (routine x 2)     Status: None   Collection Time:  05/11/17 11:44 PM  Result Value Ref Range Status   Specimen Description BLOOD LEFT ANTECUBITAL  Final   Special Requests   Final    BOTTLES DRAWN AEROBIC AND ANAEROBIC Blood Culture adequate volume   Culture NO GROWTH 5 DAYS  Final   Report Status 05/17/2017 FINAL  Final  Surgical pcr screen     Status: Abnormal   Collection Time: 05/12/17  5:55 AM  Result Value Ref Range Status   MRSA, PCR NEGATIVE NEGATIVE Final   Staphylococcus aureus POSITIVE (Noah Garner) NEGATIVE Final    Comment: (NOTE) The Xpert SA Assay (FDA approved for NASAL specimens in patients 16 years of age and older), is one component of Noah Garner comprehensive surveillance program. It is not intended to diagnose infection nor to guide or monitor treatment.          Radiology Studies: No results found.      Scheduled Meds: . aspirin EC  81 mg Oral Daily  . carvedilol  12.5 mg Oral BID WC  . darbepoetin (ARANESP) injection - NON-DIALYSIS  200 mcg Subcutaneous Q Thu-1800  . heparin  5,000 Units Subcutaneous Q8H  . hydrocerin  1 application Topical Daily  . polyethylene glycol  17 g Oral BID  . predniSONE  5 mg Oral Daily  . rOPINIRole  0.5 mg Oral QHS  . senna  1 tablet Oral QHS   Continuous Infusions:   LOS: 8 days    Time spent: over 30 minutes    Noah Belgrave Melven Sartorius, MD Noah Garner [phone]  If 7PM-7AM, please contact night-coverage [email or web address] Password [email or web address] 05/19/2017, 6:42 PM

## 2017-05-19 NOTE — Progress Notes (Signed)
PT Cancellation Note  Patient Details Name: Noah Garner MRN: [id number] DOB: 1961/03/26   Cancelled Treatment:    Reason Eval/Treat Not Completed: (P) Patient at procedure or test/unavailable (Pt off unit for dialysis)   Cristela Blue 05/19/2017, 1:16 PM  Governor Rooks, PTA pager [phone]

## 2017-05-19 NOTE — Progress Notes (Addendum)
Inpatient Rehabilitation  Attempted to meet with patent to discuss team's recommendation for IP Rehab. However, he is off the unit in HD.  Plan to follow up with patient, likely tomorrow regarding need to demonstrate therapy tolerance prior to an IP Rehab bed offer.  Please call with questions.   Carmelia Roller., CCC/SLP Admission Coordinator  [place]  Cell [phone]

## 2017-05-19 NOTE — Progress Notes (Signed)
Offers provided to patient last night and had informed of importance of making decision so that dialysis can clip patient to appropriate facility-  CSW followed up with patient this morning- has not made a decision.  CSW informed pt he needed to make decision today so we could move forward with clip process- patient reports he will call family this morning to discuss- denies need for CSW assistance with this process  CSW will continue to follow  Jorge Ny, [place] Social Worker [phone]

## 2017-05-19 NOTE — Procedures (Signed)
Tol HD treatment.  Trying for volume removal to help with LE edema/pain  Noah Garner

## 2017-05-20 ENCOUNTER — Inpatient Hospital Stay (HOSPITAL_COMMUNITY): Payer: Medicare Other

## 2017-05-20 DIAGNOSIS — M79671 Pain in right foot: Secondary | ICD-10-CM

## 2017-05-20 LAB — PARATHYROID HORMONE, INTACT (NO CA): PTH: 79 pg/mL — ABNORMAL HIGH (ref 15–65)

## 2017-05-20 MED ORDER — RENA-VITE PO TABS
1.0000 | ORAL_TABLET | Freq: Every day | ORAL | Status: DC
  Administered 2017-05-20 – 2017-05-24 (×5): 1 via ORAL
  Filled 2017-05-20 (×5): qty 1

## 2017-05-20 NOTE — Progress Notes (Signed)
1. End stage renal disease-new start 2. Bilateral leg swelling/infx:secondary to volume overload/infx XRay R foot 3. Hypertension:Blood pressure now low, now off all meds 4. Morbid obesity: & obstructive sleep apnea-he declined CPAP 5. LUE AVF functioning well  Subjective: Interval History: Painful r foot  Objective: Vital signs in last 24 hours: Temp:  [98.2 F (36.8 C)-98.8 F (37.1 C)] 98.8 F (37.1 C) (09/06 1000) Pulse Rate:  [66-80] 80 (09/06 1000) Resp:  [17-20] 20 (09/06 1000) BP: (93-109)/(45-62) 109/57 (09/06 1000) SpO2:  [100 %] 100 % (09/06 1000) Weight:  [173.3 kg (382 lb)] 173.3 kg (382 lb) (09/06 0538) Weight change: 4.082 kg (9 lb)  Intake/Output from previous day: 09/05 0701 - 09/06 0700 In: 360 [P.O.:360] Out: 4145 [Urine:200] Intake/Output this shift: No intake/output data recorded.  General appearance: alert and cooperative Resp: clear to auscultation bilaterally Chest wall: no tenderness Extremities: tender right foot MT region  Lab Results:  Recent Labs  05/19/17 0409  WBC 17.1*  HGB 7.7*  HCT 24.7*  PLT 307   BMET:  Recent Labs  05/18/17 0419 05/19/17 0409  NA 137 138  K 4.3 4.7  CL 99* 100*  CO2 30 29  GLUCOSE 153* 127*  BUN 57* 75*  CREATININE 4.77* 5.89*  CALCIUM 9.3 9.5   No results for input(s): PTH in the last 72 hours. Iron Studies: No results for input(s): IRON, TIBC, TRANSFERRIN, FERRITIN in the last 72 hours. Studies/Results: No results found.  Scheduled: . aspirin EC  81 mg Oral Daily  . darbepoetin (ARANESP) injection - NON-DIALYSIS  200 mcg Subcutaneous Q Thu-1800  . heparin  5,000 Units Subcutaneous Q8H  . hydrocerin  1 application Topical Daily  . polyethylene glycol  17 g Oral BID  . predniSONE  5 mg Oral Daily  . rOPINIRole  0.5 mg Oral QHS  . senna  1 tablet Oral QHS     LOS: 9 days   Illya Gienger C 05/20/2017,2:13 PM

## 2017-05-20 NOTE — Progress Notes (Signed)
PT Cancellation Note  Patient Details Name: Noah Garner MRN: [id number] DOB: 1961-07-06   Cancelled Treatment:    Reason Eval/Treat Not Completed: (P) Patient not medically ready (Pt awaiting xray to R foot will defer intervention at this time pending results.  )   Nicodemus Denk Eli Hose 05/20/2017, 2:40 PM  Governor Rooks, PTA pager [phone]

## 2017-05-20 NOTE — Progress Notes (Signed)
Inpatient Rehabilitation  Met with patient at bedside to discuss candidacy for an IP Rehab admission.  Shared booklets and answered questions.  Patient acknowledged that currently he could not tolerate or participate in 3 hours of therapy a day.  Plan to follow along for potential increase in therapy tolerance with clarification of right lower extremity x-ray.  Please call with questions.   Carmelia Roller., CCC/SLP Admission Coordinator  [place]  Cell [phone]

## 2017-05-20 NOTE — Progress Notes (Signed)
PROGRESS NOTE    Noah Garner  JFH:[id number] DOB: Feb 07, 1961 DOA: 05/11/2017 PCP: Delilah Shan, MD   Brief Narrative: (Start on day 1 of progress note - keep it brief and live) 56 year old male presented with leg swelling. Patient does have history of systolic heart failure, chronic kidney disease stage IV, peripheral vascular disease, morbid obesity. Patient reported worsening lower extremity edema for the last 10 days, associated with local pain and dyspnea. On initial physical examination blood pressure 113/68, heart rate 87, respiratory rate 20, temperature 98.9, saturation 95%. Moist mucous membranes, lungs were clear to auscultation bilaterally, no wheezing rales or rhonchi, heart S1-S2 present rhythmic, no gallops, abdomen was nontender and nondistended, positive lower extremity edema 2+ pitting.   Patient was admitted to the hospital with the working diagnosis of worsening extremity edema, volume overload, complicated by suspected cellulitis.   Patient did not respond to medical therapy, developed worsening uremia, started on hemodialysis. Waiting for outpatient dialysis unit.    Assessment & Plan:   Principal Problem:   Wound infection Active Problems:   Acute on chronic combined systolic and diastolic CHF (congestive heart failure) (HCC)   Hypertension   Stasis ulcer (HCC)   Morbid obesity with alveolar hypoventilation (HCC)   Rheumatoid arthritis (HCC)   Anemia of chronic disease   Leukocytosis   ESRD (end stage renal disease) (HCC)   Cellulitis   Chronic renal insufficiency, stage 4 (severe) (HCC)   PVD (peripheral vascular disease) (HCC)   OSA (obstructive sleep apnea)   Dependence on renal dialysis ([place])   Acute blood loss anemia   1. Volume overload with worsening renal function, AKI on CKD stage 4-5 with uremic symptoms. ESRD. Patient on [facility] Monday-Wednesday_Friday schedule for HD, tolerating well renal replacement therapy, symptoms of uremia, edema and  dyspnea have been improving. Patient with deconditioning, consulted inpatient rehab for possible transfer.   2. HTN. Blood pressure systolic in the 937'D, with HR 69 to 89, tolerating wellcoreg.   3. Anemia of chronic renal disease with iron deficiency. Stable to 7.7 today.  If continue worsening may need prbc transfusion, ideally during HD. No signs of active bleeding, patient on Aranesp and had IV iron.   4. Morbid obesity. Continue DVTpx. Patient with significant difficulty getting out of the bed, follow with inpatient rehab, recommendations.   5. Left leg ulceration. Clinically improving, will continue with local wound care.  6. Leukocytosis: stably elevated.  CTM. He doesn't seem to have any signs or symptoms of systemic infection at this time.     7. R foot pain: f/u plain films  DVT prophylaxis: heparin Code Status: full Family Communication: none Disposition Plan: home vs snf  Consultants:   nephrology  Procedures: (Don't include imaging studies which can be auto populated. Include things that cannot be auto populated i.e. Echo, Carotid and venous dopplers, Foley, Bipap, HD, tubes/drains, wound vac, central lines etc)  hemodialysis  Antimicrobials: (specify start and planned stop date. Auto populated tables are space occupying and do not give end dates) Anti-infectives    Start     Dose/Rate Route Frequency Ordered Stop   05/12/17 0800  piperacillin-tazobactam (ZOSYN) IVPB 3.375 g     3.375 g 100 mL/hr over 30 Minutes Intravenous  Once 05/11/17 2357 05/12/17 0910   05/12/17 0015  vancomycin (VANCOCIN) 1,500 mg in sodium chloride 0.9 % 500 mL IVPB     1,500 mg 250 mL/hr over 120 Minutes Intravenous  Once 05/12/17 0012 05/12/17 0536   05/11/17 2315  cefTRIAXone (ROCEPHIN) 1 g in dextrose 5 % 50 mL IVPB     1 g 100 mL/hr over 30 Minutes Intravenous  Once 05/11/17 2307 05/12/17 0022   05/11/17 2315  vancomycin (VANCOCIN) IVPB 1000 mg/200 mL premix     1,000 mg 200  mL/hr over 60 Minutes Intravenous  Once 05/11/17 2307 05/12/17 0045         Subjective: No CP or SOB. Does have some RLE pain he notes has been there Jin [place] while.   Objective: Vitals:   05/20/17 0538 05/20/17 1000 05/20/17 1400 05/20/17 1553  BP: 99/62 (!) 109/57 106/62 111/69  Pulse: 77 80 70 77  Resp: 18 20 18 18   Temp: 98.6 F (37 C) 98.8 F (37.1 C) 98 F (36.7 C) 99.5 F (37.5 C)  TempSrc: Oral Oral Oral Oral  SpO2: 100% 100% 96% 100%  Weight: (!) 173.3 kg (382 lb)     Height:        Intake/Output Summary (Last 24 hours) at 05/20/17 1608 Last data filed at 05/20/17 0541  Gross per 24 hour  Intake              120 ml  Output             4045 ml  Net            -3925 ml   Filed Weights   05/19/17 0613 05/20/17 0538  Weight: (!) 169.2 kg (373 lb) (!) 173.3 kg (382 lb)    Examination:  General: No acute distress. Cardiovascular: Heart sounds show Saketh Daubert regular rate, and rhythm. No gallops or rubs. No murmurs. No JVD. Lungs: Clear to auscultation bilaterally with good air movement. No rales, rhonchi or wheezes. Abdomen: Soft, nontender, nondistended with normal active bowel sounds. No masses. No hepatosplenomegaly. Neurological: Alert and oriented 3. Moves all extremities 4 with equal strength. Cranial nerves II through XII grossly intact. Skin: Warm and dry. No rashes or lesions. Extremities: bilateral edematous LE, 2+.  L leg wrapped .  R foot TTP over dorsum of midfoot.    Data Reviewed: I have personally reviewed following labs and imaging studies  CBC:  Recent Labs Lab 05/14/17 0553 05/16/17 0052 05/17/17 0821 05/19/17 0409  WBC 17.0* 14.7* 17.7* 17.1*  NEUTROABS  --   --   --  13.1*  HGB 8.4* 7.7* 7.8* 7.7*  HCT 26.2* 23.4* 23.9* 24.7*  MCV 81.9 79.6 80.7 82.9  PLT 275 308 334 998   Basic Metabolic Panel:  Recent Labs Lab 05/16/17 0052 05/17/17 0332 05/17/17 0821 05/18/17 0419 05/19/17 0409  NA 137 138 137 137 138  K 3.5 3.6 3.7 4.3 4.7  CL  98* 99* 99* 99* 100*  CO2 28 28 26 30 29   GLUCOSE 140* 137* 126* 153* 127*  BUN 95* 74* 76* 57* 75*  CREATININE 5.47* 4.94* 5.13* 4.77* 5.89*  CALCIUM 8.9 9.3 9.4 9.3 9.5  PHOS 5.1*  --  4.0  --   --    GFR: Estimated Creatinine Clearance: 22.7 mL/min (Lonnie Reth) (by C-G formula based on SCr of 5.89 mg/dL (H)). Liver Function Tests:  Recent Labs Lab 05/14/17 1741 05/16/17 0052 05/17/17 0821  ALT 23  --   --   ALBUMIN  --  2.2* 2.3*   No results for input(s): LIPASE, AMYLASE in the last 168 hours. No results for input(s): AMMONIA in the last 168 hours. Coagulation Profile: No results for input(s): INR, PROTIME in the last 168 hours. Cardiac Enzymes: No  results for input(s): CKTOTAL, CKMB, CKMBINDEX, TROPONINI in the last 168 hours. BNP (last 3 results) No results for input(s): PROBNP in the last 8760 hours. HbA1C: No results for input(s): HGBA1C in the last 72 hours. CBG: No results for input(s): GLUCAP in the last 168 hours. Lipid Profile: No results for input(s): CHOL, HDL, LDLCALC, TRIG, CHOLHDL, LDLDIRECT in the last 72 hours. Thyroid Function Tests: No results for input(s): TSH, T4TOTAL, FREET4, T3FREE, THYROIDAB in the last 72 hours. Anemia Panel: No results for input(s): VITAMINB12, FOLATE, FERRITIN, TIBC, IRON, RETICCTPCT in the last 72 hours. Sepsis Labs: No results for input(s): PROCALCITON, LATICACIDVEN in the last 168 hours.  Recent Results (from the past 240 hour(s))  Culture, blood (routine x 2)     Status: None   Collection Time: 05/11/17  7:24 PM  Result Value Ref Range Status   Specimen Description BLOOD RIGHT ANTECUBITAL  Final   Special Requests   Final    BOTTLES DRAWN AEROBIC AND ANAEROBIC Blood Culture adequate volume   Culture NO GROWTH 5 DAYS  Final   Report Status 05/17/2017 FINAL  Final  Culture, blood (routine x 2)     Status: None   Collection Time: 05/11/17 11:44 PM  Result Value Ref Range Status   Specimen Description BLOOD LEFT ANTECUBITAL   Final   Special Requests   Final    BOTTLES DRAWN AEROBIC AND ANAEROBIC Blood Culture adequate volume   Culture NO GROWTH 5 DAYS  Final   Report Status 05/17/2017 FINAL  Final  Surgical pcr screen     Status: Abnormal   Collection Time: 05/12/17  5:55 AM  Result Value Ref Range Status   MRSA, PCR NEGATIVE NEGATIVE Final   Staphylococcus aureus POSITIVE (Nikolay Demetriou) NEGATIVE Final    Comment: (NOTE) The Xpert SA Assay (FDA approved for NASAL specimens in patients 68 years of age and older), is one component of Eveleen Mcnear comprehensive surveillance program. It is not intended to diagnose infection nor to guide or monitor treatment.          Radiology Studies: No results found.      Scheduled Meds: . aspirin EC  81 mg Oral Daily  . darbepoetin (ARANESP) injection - NON-DIALYSIS  200 mcg Subcutaneous Q Thu-1800  . heparin  5,000 Units Subcutaneous Q8H  . hydrocerin  1 application Topical Daily  . multivitamin  1 tablet Oral QHS  . polyethylene glycol  17 g Oral BID  . predniSONE  5 mg Oral Daily  . rOPINIRole  0.5 mg Oral QHS  . senna  1 tablet Oral QHS   Continuous Infusions:   LOS: 9 days    Time spent: over 30 minutes    Anshul Meddings Melven Sartorius, MD [facility] [phone]  If 7PM-7AM, please contact night-coverage [email or web address] Password [email or web address] 05/20/2017, 4:08 PM

## 2017-05-21 DIAGNOSIS — M1000 Idiopathic gout, unspecified site: Secondary | ICD-10-CM

## 2017-05-21 LAB — CBC
HCT: 25.7 % — ABNORMAL LOW (ref 39.0–52.0)
Hemoglobin: 8 g/dL — ABNORMAL LOW (ref 13.0–17.0)
MCH: 25.9 pg — ABNORMAL LOW (ref 26.0–34.0)
MCHC: 31.1 g/dL (ref 30.0–36.0)
MCV: 83.2 fL (ref 78.0–100.0)
PLATELETS: 292 10*3/uL (ref 150–400)
RBC: 3.09 MIL/uL — AB (ref 4.22–5.81)
RDW: 16.4 % — ABNORMAL HIGH (ref 11.5–15.5)
WBC: 17.3 10*3/uL — AB (ref 4.0–10.5)

## 2017-05-21 LAB — COMPREHENSIVE METABOLIC PANEL
ALK PHOS: 95 U/L (ref 38–126)
ALT: 33 U/L (ref 17–63)
ANION GAP: 13 (ref 5–15)
AST: 27 U/L (ref 15–41)
Albumin: 2.5 g/dL — ABNORMAL LOW (ref 3.5–5.0)
BUN: 66 mg/dL — ABNORMAL HIGH (ref 6–20)
CALCIUM: 9.8 mg/dL (ref 8.9–10.3)
CHLORIDE: 96 mmol/L — AB (ref 101–111)
CO2: 28 mmol/L (ref 22–32)
CREATININE: 6.73 mg/dL — AB (ref 0.61–1.24)
GFR calc Af Amer: 10 mL/min — ABNORMAL LOW (ref >60)
GFR, EST NON AFRICAN AMERICAN: 8 mL/min — AB (ref >60)
Glucose, Bld: 115 mg/dL — ABNORMAL HIGH (ref 65–99)
Potassium: 4.4 mmol/L (ref 3.5–5.1)
Sodium: 137 mmol/L (ref 135–145)
Total Bilirubin: 0.6 mg/dL (ref 0.3–1.2)
Total Protein: 7.7 g/dL (ref 6.5–8.1)

## 2017-05-21 MED ORDER — LIDOCAINE HCL (PF) 1 % IJ SOLN: 5.0000 mL | INTRAMUSCULAR | Status: DC | PRN

## 2017-05-21 MED ORDER — PENTAFLUOROPROP-TETRAFLUOROETH EX AERO
INHALATION_SPRAY | CUTANEOUS | Status: AC
  Filled 2017-05-21: qty 103.5

## 2017-05-21 MED ORDER — COLCHICINE 0.6 MG PO TABS
0.6000 mg | ORAL_TABLET | Freq: Two times a day (BID) | ORAL | Status: DC
  Administered 2017-05-21 – 2017-05-23 (×5): 0.6 mg via ORAL
  Filled 2017-05-21 (×6): qty 1

## 2017-05-21 MED ORDER — ALTEPLASE 2 MG IJ SOLR: 2.0000 mg | Freq: Once | INTRAMUSCULAR | Status: DC | PRN

## 2017-05-21 MED ORDER — SODIUM CHLORIDE 0.9 % IV SOLN: 100.0000 mL | INTRAVENOUS | Status: DC | PRN

## 2017-05-21 MED ORDER — HEPARIN SODIUM (PORCINE) 1000 UNIT/ML DIALYSIS: 1000.0000 [IU] | INTRAMUSCULAR | Status: DC | PRN

## 2017-05-21 MED ORDER — LIDOCAINE-PRILOCAINE 2.5-2.5 % EX CREA: 1.0000 "application " | TOPICAL_CREAM | CUTANEOUS | Status: DC | PRN

## 2017-05-21 MED ORDER — HEPARIN SODIUM (PORCINE) 1000 UNIT/ML DIALYSIS: 20.0000 [IU]/kg | INTRAMUSCULAR | Status: DC | PRN

## 2017-05-21 MED ORDER — PENTAFLUOROPROP-TETRAFLUOROETH EX AERO: 1.0000 "application " | INHALATION_SPRAY | CUTANEOUS | Status: DC | PRN

## 2017-05-21 NOTE — Clinical Social Work Note (Signed)
Pt chooses [facility]. CSW contacted dialysis in order for them to determine transport to and from facility and which facility the pt will received dialysis at.   [place], [place]

## 2017-05-21 NOTE — Care Management Note (Signed)
Case Management Note  Patient Details  Name: Noah Garner MRN: [id number] Date of Birth: 16-Feb-1961  Subjective/Objective:                    Action/Plan:  SW provided SNF offers 05/18/17. Patient has yet to make a decision. Patient stated we are "rushing him". Explained need to know which SNF he wants to go to so hemodialysis can be arranged. Offered to call family/friend to assist him with decision. Patient refused states " everyone is at work and cannot be called".   Dr Florene Glen aware and will speak with patient. Expected Discharge Date:  05/15/17               Expected Discharge Plan:  Skilled Nursing Facility  In-House Referral:  Clinical Social Work  Discharge planning Services  CM Consult  Post Acute Care Choice:    Choice offered to:  Patient  DME Arranged:    DME Agency:     HH Arranged:    [place] Agency:     Status of Service:  In process, will continue to follow  If discussed at Long Length of Stay Meetings, dates discussed:    Additional Comments:  Marilu Favre, RN 05/21/2017, 1:32 PM

## 2017-05-21 NOTE — Progress Notes (Signed)
PT Cancellation Note  Patient Details Name: Noah Garner MRN: [id number] DOB: 1961/01/08   Cancelled Treatment:    Reason Eval/Treat Not Completed: (P) Patient at procedure or test/unavailable (Pt off unit for dialysis, will f/u pending return.  x-ray to R foot suggest gout flare up with colchicine ordered BID.  Will continue PT per POC.  )   Quintan Saldivar Eli Hose 05/21/2017, 9:51 AM  Governor Rooks, PTA pager [phone]

## 2017-05-21 NOTE — Procedures (Signed)
1. End stage renal disease-new start 2. Bilateral leg swelling/infx:secondary to volume overload/infx  3. Hypertension:Blood pressure now low, now off all meds 4. Morbid obesity: &obstructive sleep apnea-he declined CPAP 5. LUE AVF functioning well 6. Prob right foot gout(radiographic).  Rx Colchicine  Tol HD, BP down and off all meds.  Basically at dry weight.  Await OP dialysis assignment and dispo.  He needs to decide on discharge location.  Will see if colchicine helps mobility. Vernadine Coombs C

## 2017-05-21 NOTE — Progress Notes (Signed)
PROGRESS NOTE    Noah Garner  OZH:[id number] DOB: [birth year] DOA: 05/11/2017 PCP: Delilah Shan, MD   Brief Narrative:  56 year old male presented with leg swelling. Patient does have history of systolic heart failure, chronic kidney disease stage IV, peripheral vascular disease, morbid obesity. Patient reported worsening lower extremity edema for the last 10 days, associated with local pain and dyspnea. On initial physical examination blood pressure 113/68, heart rate 87, respiratory rate 20, temperature 98.9, saturation 95%. Moist mucous membranes, lungs were clear to auscultation bilaterally, no wheezing rales or rhonchi, heart S1-S2 present rhythmic, no gallops, abdomen was nontender and nondistended, positive lower extremity edema 2+ pitting.   Patient was admitted to the hospital with the working diagnosis of worsening extremity edema, volume overload, complicated by suspected cellulitis.   Patient did not respond to medical therapy, developed worsening uremia, started on hemodialysis. Waiting for outpatient dialysis unit.    Assessment & Plan:   Principal Problem:   Wound infection Active Problems:   Acute on chronic combined systolic and diastolic CHF (congestive heart failure) (HCC)   Hypertension   Stasis ulcer (HCC)   Morbid obesity with alveolar hypoventilation (HCC)   Rheumatoid arthritis (HCC)   Anemia of chronic disease   Leukocytosis   ESRD (end stage renal disease) (HCC)   Cellulitis   Chronic renal insufficiency, stage 4 (severe) (HCC)   PVD (peripheral vascular disease) (HCC)   OSA (obstructive sleep apnea)   Dependence on renal dialysis ([place])   Acute blood loss anemia   1. Volume overload with worsening renal function, AKI on CKD stage 4-5 with uremic symptoms. ESRD. Patient on a Monday-Wednesday_Friday schedule for HD, tolerating well renal replacement therapy, symptoms of uremia, edema and dyspnea have been improving. Patient with deconditioning,  consulted inpatient rehab for possible transfer.   2. HTN. BP's have been low while here.  Coreg discontinued by nephrology.   3. Anemia of chronic renal disease with iron deficiency. Stable.  If continue worsening may need prbc transfusion, ideally during HD. No signs of active bleeding, patient on Aranesp and had IV iron.   4. Morbid obesity. Continue DVTpx. Patient with significant difficulty getting out of the bed, follow with inpatient rehab, recommendations.   5. Left leg ulceration. Clinically improving, will continue with local wound care.  6. Leukocytosis: stably elevated.  CTM. He doesn't seem to have any signs or symptoms of systemic infection at this time.     7. R foot pain, imaging c/w gout: colchicine  DVT prophylaxis: heparin Code Status: full Family Communication: none Disposition Plan: home vs snf  Consultants:   nephrology  Procedures: (Don't include imaging studies which can be auto populated. Include things that cannot be auto populated i.e. Echo, Carotid and venous dopplers, Foley, Bipap, HD, tubes/drains, wound vac, central lines etc)  hemodialysis  Antimicrobials: (specify start and planned stop date. Auto populated tables are space occupying and do not give end dates) Anti-infectives    Start     Dose/Rate Route Frequency Ordered Stop   05/12/17 0800  piperacillin-tazobactam (ZOSYN) IVPB 3.375 g     3.375 g 100 mL/hr over 30 Minutes Intravenous  Once 05/11/17 2357 05/12/17 0910   05/12/17 0015  vancomycin (VANCOCIN) 1,500 mg in sodium chloride 0.9 % 500 mL IVPB     1,500 mg 250 mL/hr over 120 Minutes Intravenous  Once 05/12/17 0012 05/12/17 0536   05/11/17 2315  cefTRIAXone (ROCEPHIN) 1 g in dextrose 5 % 50 mL IVPB  1 g 100 mL/hr over 30 Minutes Intravenous  Once 05/11/17 2307 05/12/17 0022   05/11/17 2315  vancomycin (VANCOCIN) IVPB 1000 mg/200 mL premix     1,000 mg 200 mL/hr over 60 Minutes Intravenous  Once 05/11/17 2307 05/12/17 0045           Subjective: No CP or SOB.  RLE pain may be a little better.  [facility].  Wanted to be careful with his decision.  Objective: Vitals:   05/21/17 1030 05/21/17 1100 05/21/17 1116 05/21/17 1255  BP: 95/63 91/62 102/69 (!) 125/91  Pulse: 68 71 80 86  Resp:   17 18  Temp:   97.6 F (36.4 C) 98.1 F (36.7 C)  TempSrc:   Oral Oral  SpO2:   100% 99%  Weight:      Height:        Intake/Output Summary (Last 24 hours) at 05/21/17 1603 Last data filed at 05/21/17 1446  Gross per 24 hour  Intake              240 ml  Output             3023 ml  Net            -2783 ml   Filed Weights   05/19/17 0613 05/20/17 0538  Weight: (!) 169.2 kg (373 lb) (!) 173.3 kg (382 lb)    Examination:  General: No acute distress. Cardiovascular: Heart sounds show a regular rate, and rhythm. No gallops or rubs. No murmurs. No JVD. Lungs: Clear to auscultation bilaterally with good air movement. No rales, rhonchi or wheezes. Abdomen: Soft, nontender, nondistended with normal active bowel sounds. No masses. No hepatosplenomegaly. Neurological: Alert and oriented 3. Moves all extremities 4 with equal strength. Cranial nerves II through XII grossly intact. Skin: Warm and dry. No rashes or lesions. Extremities: No clubbing or cyanosis. 2+ LE edema. Pedal pulses 2+.  Socks over both legs.  TTP over first MTP and dorsum of midfoot, seems a bit better than yesterday. Psychiatric: Mood and affect are normal. Insight and judgment are appropriate.   Data Reviewed: I have personally reviewed following labs and imaging studies  CBC:  Recent Labs Lab 05/16/17 0052 05/17/17 0821 05/19/17 0409 05/21/17 0503  WBC 14.7* 17.7* 17.1* 17.3*  NEUTROABS  --   --  13.1*  --   HGB 7.7* 7.8* 7.7* 8.0*  HCT 23.4* 23.9* 24.7* 25.7*  MCV 79.6 80.7 82.9 83.2  PLT 308 334 307 144   Basic Metabolic Panel:  Recent Labs Lab 05/16/17 0052 05/17/17 0332 05/17/17 0821 05/18/17 0419 05/19/17 0409  05/21/17 0503  NA 137 138 137 137 138 137  K 3.5 3.6 3.7 4.3 4.7 4.4  CL 98* 99* 99* 99* 100* 96*  CO2 28 28 26 30 29 28   GLUCOSE 140* 137* 126* 153* 127* 115*  BUN 95* 74* 76* 57* 75* 66*  CREATININE 5.47* 4.94* 5.13* 4.77* 5.89* 6.73*  CALCIUM 8.9 9.3 9.4 9.3 9.5 9.8  PHOS 5.1*  --  4.0  --   --   --    GFR: Estimated Creatinine Clearance: 19.8 mL/min (A) (by C-G formula based on SCr of 6.73 mg/dL (H)). Liver Function Tests:  Recent Labs Lab 05/14/17 1741 05/16/17 0052 05/17/17 0821 05/21/17 0503  AST  --   --   --  27  ALT 23  --   --  33  ALKPHOS  --   --   --  95  BILITOT  --   --   --  0.6  PROT  --   --   --  7.7  ALBUMIN  --  2.2* 2.3* 2.5*   No results for input(s): LIPASE, AMYLASE in the last 168 hours. No results for input(s): AMMONIA in the last 168 hours. Coagulation Profile: No results for input(s): INR, PROTIME in the last 168 hours. Cardiac Enzymes: No results for input(s): CKTOTAL, CKMB, CKMBINDEX, TROPONINI in the last 168 hours. BNP (last 3 results) No results for input(s): PROBNP in the last 8760 hours. HbA1C: No results for input(s): HGBA1C in the last 72 hours. CBG: No results for input(s): GLUCAP in the last 168 hours. Lipid Profile: No results for input(s): CHOL, HDL, LDLCALC, TRIG, CHOLHDL, LDLDIRECT in the last 72 hours. Thyroid Function Tests: No results for input(s): TSH, T4TOTAL, FREET4, T3FREE, THYROIDAB in the last 72 hours. Anemia Panel: No results for input(s): VITAMINB12, FOLATE, FERRITIN, TIBC, IRON, RETICCTPCT in the last 72 hours. Sepsis Labs: No results for input(s): PROCALCITON, LATICACIDVEN in the last 168 hours.  Recent Results (from the past 240 hour(s))  Culture, blood (routine x 2)     Status: None   Collection Time: 05/11/17  7:24 PM  Result Value Ref Range Status   Specimen Description BLOOD RIGHT ANTECUBITAL  Final   Special Requests   Final    BOTTLES DRAWN AEROBIC AND ANAEROBIC Blood Culture adequate volume    Culture NO GROWTH 5 DAYS  Final   Report Status 05/17/2017 FINAL  Final  Culture, blood (routine x 2)     Status: None   Collection Time: 05/11/17 11:44 PM  Result Value Ref Range Status   Specimen Description BLOOD LEFT ANTECUBITAL  Final   Special Requests   Final    BOTTLES DRAWN AEROBIC AND ANAEROBIC Blood Culture adequate volume   Culture NO GROWTH 5 DAYS  Final   Report Status 05/17/2017 FINAL  Final  Surgical pcr screen     Status: Abnormal   Collection Time: 05/12/17  5:55 AM  Result Value Ref Range Status   MRSA, PCR NEGATIVE NEGATIVE Final   Staphylococcus aureus POSITIVE (A) NEGATIVE Final    Comment: (NOTE) The Xpert SA Assay (FDA approved for NASAL specimens in patients 8 years of age and older), is one component of a comprehensive surveillance program. It is not intended to diagnose infection nor to guide or monitor treatment.          Radiology Studies: Dg Foot 2 Views Right  Result Date: 05/20/2017 CLINICAL DATA:  Diffuse right foot pain.  Right foot swelling. EXAM: RIGHT FOOT - 2 VIEW COMPARISON:  None. FINDINGS: No acute bony or joint abnormality is identified. There is joint space narrowing and marginal erosions with overhanging edges at the IP joint of the great toe and first MTP joint. Joint space narrowing and erosions are also present about the tarsometatarsal joints, worst at the second and third tarsometatarsal joints. No fracture or dislocation. Soft tissue swelling is present. IMPRESSION: No acute bony abnormality. Diffuse soft tissue swelling. Abnormal appearance of the first MTP joint, IP joint of the toe and tarsometatarsal joints highly suggestive of gouty arthropathy. Electronically Signed   By: Inge Rise M.D.   On: 05/20/2017 15:54        Scheduled Meds: . aspirin EC  81 mg Oral Daily  . colchicine  0.6 mg Oral BID  . darbepoetin (ARANESP) injection - NON-DIALYSIS  200 mcg Subcutaneous Q Thu-1800  . heparin  5,000 Units Subcutaneous Q8H   . hydrocerin  1 application Topical Daily  . multivitamin  1 tablet Oral QHS  . pentafluoroprop-tetrafluoroeth      . polyethylene glycol  17 g Oral BID  . predniSONE  5 mg Oral Daily  . rOPINIRole  0.5 mg Oral QHS  . senna  1 tablet Oral QHS   Continuous Infusions:   LOS: 10 days    Time spent: over 30 minutes    A Melven Sartorius, MD [facility] [phone]  If 7PM-7AM, please contact night-coverage [email or web address] Password [email or web address] 05/21/2017, 4:03 PM

## 2017-05-21 NOTE — Progress Notes (Signed)
PT Cancellation Note  Patient Details Name: Noah Garner MRN: [id number] DOB: [birth year]   Cancelled Treatment:    Reason Eval/Treat Not Completed: (P) Fatigue/lethargy limiting ability to participate (Pt reports he is fatigued from dialysis and wants to wait to allow for gout medication to work.  He reports he will try and stand over the weekend with nursing staff.  Informed patient we would follow up on Monday.  )   Valerio Pinard J Peyson Delao 05/21/2017, 3:04 PM  [place]or [place], PTA pager [phone]

## 2017-05-21 NOTE — Clinical Social Work Note (Signed)
CSW attempted to speak to pt about which facility he would like to go to for SNF. Pt continued to look at list and say "I don't know." Pt would say "just do what you have to do." CSW explained dialysis needed to be notified of which facility in order for transport to be arranged, pt states "ill just get a ride and go home." CSW attempted several times at bedside to get pt to choose facility--however, unsuccessful. RNCM attempted as well. RNCM notified MD.  Loletha Grayer, [place]

## 2017-05-21 NOTE — Progress Notes (Signed)
Inpatient Rehabilitation  If patient remains in house over the weekend I will follow up Monday, 05/24/17 for increased therapy tolerance.  Call if questions.   Carmelia Roller., CCC/SLP Admission Coordinator  [place]  Cell [phone]

## 2017-05-21 NOTE — Care Management Important Message (Deleted)
Important Message  Patient Details  Name: Noah Garner MRN: [id number] Date of Birth: [birth year]   Medicare Important Message Given:  Yes    Marilu Favre, RN 05/21/2017, 1:35 PM

## 2017-05-22 DIAGNOSIS — L97801 Non-pressure chronic ulcer of other part of unspecified lower leg limited to breakdown of skin: Secondary | ICD-10-CM

## 2017-05-22 DIAGNOSIS — I83008 Varicose veins of unspecified lower extremity with ulcer other part of lower leg: Secondary | ICD-10-CM

## 2017-05-22 NOTE — Progress Notes (Signed)
Patient refuses CPAP 

## 2017-05-22 NOTE — Progress Notes (Signed)
Patient refused CPAP 9/7

## 2017-05-22 NOTE — Progress Notes (Addendum)
PROGRESS NOTE    Noah Garner  OIZ:[id number] DOB: [birth year] DOA: 05/11/2017 PCP: Delilah Shan, MD   Brief Narrative:  56 year old male presented with leg swelling. Patient does have history of systolic heart failure, chronic kidney disease stage IV, peripheral vascular disease, morbid obesity. Patient reported worsening lower extremity edema for the last 10 days, associated with local pain and dyspnea. On initial physical examination blood pressure 113/68, heart rate 87, respiratory rate 20, temperature 98.9, saturation 95%. Moist mucous membranes, lungs were clear to auscultation bilaterally, no wheezing rales or rhonchi, heart S1-S2 present rhythmic, no gallops, abdomen was nontender and nondistended, positive lower extremity edema 2+ pitting.   Patient was admitted to the hospital with the working diagnosis of worsening extremity edema, volume overload, complicated by suspected cellulitis.   Patient did not respond to medical therapy, developed worsening uremia, started on hemodialysis. Waiting for outpatient dialysis unit.   Has decided on SNF at [facility].  Assessment & Plan:   Principal Problem:   Wound infection Active Problems:   Acute on chronic combined systolic and diastolic CHF (congestive heart failure) (HCC)   Hypertension   Stasis ulcer (HCC)   Morbid obesity with alveolar hypoventilation (HCC)   Rheumatoid arthritis (HCC)   Anemia of chronic disease   Leukocytosis   ESRD (end stage renal disease) (HCC)   Cellulitis   Chronic renal insufficiency, stage 4 (severe) (HCC)   PVD (peripheral vascular disease) (HCC)   OSA (obstructive sleep apnea)   Dependence on renal dialysis ([place])   Acute blood loss anemia   1. Volume overload with worsening renal function, AKI on CKD stage 4-5 with uremic symptoms. ESRD. Patient on [facility] Monday-Wednesday_Friday schedule for HD, tolerating well renal replacement therapy, symptoms of uremia, edema and dyspnea have been improving.  Patient with deconditioning, consulted inpatient rehab for possible transfer.   2. HTN. BP's have been low while here.  Coreg discontinued by nephrology.   3. Anemia of chronic renal disease with iron deficiency. Stable.  If continue worsening may need prbc transfusion, ideally during HD. No signs of active bleeding, patient on Aranesp and had IV iron.   4. Morbid obesity. Continue DVTpx. Patient with significant difficulty getting out of the bed, follow with inpatient rehab, recommendations.   5. Left leg ulceration. Clinically improving, will continue with local wound care. "Wash and dry bilateral lower legs, apply Eucerin cream daily prior to the wound care on left lateral leg, cleanse with NS, pat dry, apply Calcium Alginate dressing, perform daily."  6. Leukocytosis: stably elevated.  CTM. He doesn't seem to have any signs or symptoms of systemic infection at this time.     7. R foot pain, imaging c/w gout: colchicine, improved pain.  DVT prophylaxis: heparin Code Status: full Family Communication: none Disposition Plan: snf  Consultants:   nephrology  Procedures: (Don't include imaging studies which can be auto populated. Include things that cannot be auto populated i.e. Echo, Carotid and venous dopplers, Foley, Bipap, HD, tubes/drains, wound vac, central lines etc)  hemodialysis  Antimicrobials: (specify start and planned stop date. Auto populated tables are space occupying and do not give end dates) Anti-infectives    Start     Dose/Rate Route Frequency Ordered Stop   05/12/17 0800  piperacillin-tazobactam (ZOSYN) IVPB 3.375 g     3.375 g 100 mL/hr over 30 Minutes Intravenous  Once 05/11/17 2357 05/12/17 0910   05/12/17 0015  vancomycin (VANCOCIN) 1,500 mg in sodium chloride 0.9 % 500 mL IVPB  1,500 mg 250 mL/hr over 120 Minutes Intravenous  Once 05/12/17 0012 05/12/17 0536   05/11/17 2315  cefTRIAXone (ROCEPHIN) 1 g in dextrose 5 % 50 mL IVPB     1 g 100 mL/hr  over 30 Minutes Intravenous  Once 05/11/17 2307 05/12/17 0022   05/11/17 2315  vancomycin (VANCOCIN) IVPB 1000 mg/200 mL premix     1,000 mg 200 mL/hr over 60 Minutes Intravenous  Once 05/11/17 2307 05/12/17 0045         Subjective: No CP, no SOB.  Pain in R foot is better.   Objective: Vitals:   05/21/17 1116 05/21/17 1255 05/21/17 2123 05/22/17 0532  BP: 102/69 (!) 125/91 104/62 112/77  Pulse: 80 86 84 80  Resp: 17 18 18 18   Temp: 97.6 F (36.4 C) 98.1 F (36.7 C) 98.4 F (36.9 C) 97.9 F (36.6 C)  TempSrc: Oral Oral Oral   SpO2: 100% 99% 99% 100%  Weight:    (!) 179.8 kg (396 lb 6.4 oz)  Height:        Intake/Output Summary (Last 24 hours) at 05/22/17 1041 Last data filed at 05/22/17 0530  Gross per 24 hour  Intake              360 ml  Output             2923 ml  Net            -2563 ml   Filed Weights   05/20/17 0538 05/22/17 0532  Weight: (!) 173.3 kg (382 lb) (!) 179.8 kg (396 lb 6.4 oz)    Examination:  General: No acute distress.   Cardiovascular: Heart sounds show Jemiah [place] regular rate, and rhythm. No gallops or rubs. No murmurs. No JVD. Lungs: Clear to auscultation bilaterally with good air movement. No rales, rhonchi or wheezes. Abdomen: Protuberant, Soft, nontender, nondistended with normal active bowel sounds. No masses. No hepatosplenomegaly. Neurological: Alert and oriented 3. Cranial nerves II through XII grossly intact. Skin: Warm and dry. No rashes or lesions. Extremities: No clubbing or cyanosis. 2+ LEE.  RLE with improved pain.  Dressing over L lateral leg ulcer, healing well.  Psychiatric: Mood and affect are normal. Insight and judgment are appropriate.  Data Reviewed: I have personally reviewed following labs and imaging studies  CBC:  Recent Labs Lab 05/16/17 0052 05/17/17 0821 05/19/17 0409 05/21/17 0503  WBC 14.7* 17.7* 17.1* 17.3*  NEUTROABS  --   --  13.1*  --   HGB 7.7* 7.8* 7.7* 8.0*  HCT 23.4* 23.9* 24.7* 25.7*  MCV 79.6 80.7  82.9 83.2  PLT 308 334 307 403   Basic Metabolic Panel:  Recent Labs Lab 05/16/17 0052 05/17/17 0332 05/17/17 0821 05/18/17 0419 05/19/17 0409 05/21/17 0503  NA 137 138 137 137 138 137  K 3.5 3.6 3.7 4.3 4.7 4.4  CL 98* 99* 99* 99* 100* 96*  CO2 28 28 26 30 29 28   GLUCOSE 140* 137* 126* 153* 127* 115*  BUN 95* 74* 76* 57* 75* 66*  CREATININE 5.47* 4.94* 5.13* 4.77* 5.89* 6.73*  CALCIUM 8.9 9.3 9.4 9.3 9.5 9.8  PHOS 5.1*  --  4.0  --   --   --    GFR: Estimated Creatinine Clearance: 20.3 mL/min ([facility] [place]) (by C-G formula based on SCr of 6.73 mg/dL (H)). Liver Function Tests:  Recent Labs Lab 05/16/17 0052 05/17/17 0821 05/21/17 0503  AST  --   --  27  ALT  --   --  33  ALKPHOS  --   --  95  BILITOT  --   --  0.6  PROT  --   --  7.7  ALBUMIN 2.2* 2.3* 2.5*   No results for input(s): LIPASE, AMYLASE in the last 168 hours. No results for input(s): AMMONIA in the last 168 hours. Coagulation Profile: No results for input(s): INR, PROTIME in the last 168 hours. Cardiac Enzymes: No results for input(s): CKTOTAL, CKMB, CKMBINDEX, TROPONINI in the last 168 hours. BNP (last 3 results) No results for input(s): PROBNP in the last 8760 hours. HbA1C: No results for input(s): HGBA1C in the last 72 hours. CBG: No results for input(s): GLUCAP in the last 168 hours. Lipid Profile: No results for input(s): CHOL, HDL, LDLCALC, TRIG, CHOLHDL, LDLDIRECT in the last 72 hours. Thyroid Function Tests: No results for input(s): TSH, T4TOTAL, FREET4, T3FREE, THYROIDAB in the last 72 hours. Anemia Panel: No results for input(s): VITAMINB12, FOLATE, FERRITIN, TIBC, IRON, RETICCTPCT in the last 72 hours. Sepsis Labs: No results for input(s): PROCALCITON, LATICACIDVEN in the last 168 hours.  No results found for this or any previous visit (from the past 240 hour(s)).       Radiology Studies: Dg Foot 2 Views Right  Result Date: 05/20/2017 CLINICAL DATA:  Diffuse right foot pain.  Right  foot swelling. EXAM: RIGHT FOOT - 2 VIEW COMPARISON:  None. FINDINGS: No acute bony or joint abnormality is identified. There is joint space narrowing and marginal erosions with overhanging edges at the IP joint of the great toe and first MTP joint. Joint space narrowing and erosions are also present about the tarsometatarsal joints, worst at the second and third tarsometatarsal joints. No fracture or dislocation. Soft tissue swelling is present. IMPRESSION: No acute bony abnormality. Diffuse soft tissue swelling. Abnormal appearance of the first MTP joint, IP joint of the toe and tarsometatarsal joints highly suggestive of gouty arthropathy. Electronically Signed   By: Inge Rise M.D.   On: 05/20/2017 15:54        Scheduled Meds: . aspirin EC  81 mg Oral Daily  . colchicine  0.6 mg Oral BID  . darbepoetin (ARANESP) injection - NON-DIALYSIS  200 mcg Subcutaneous Q Thu-1800  . heparin  5,000 Units Subcutaneous Q8H  . hydrocerin  1 application Topical Daily  . multivitamin  1 tablet Oral QHS  . polyethylene glycol  17 g Oral BID  . predniSONE  5 mg Oral Daily  . rOPINIRole  0.5 mg Oral QHS  . senna  1 tablet Oral QHS   Continuous Infusions:   LOS: 11 days    Time spent: 15 minutes    Jian Hodgman Melven Sartorius, MD [facility] [phone]  If 7PM-7AM, please contact night-coverage [email or web address] Password [email or web address] 05/22/2017, 10:41 AM

## 2017-05-22 NOTE — Progress Notes (Signed)
1. End stage renal disease-new start--MWF  Plan is for him to go to [facility] and we now await dialysis unit location 2. Bilateral leg swelling/infx:secondary to volume overload/infx 3. Hypertension:Blood pressure now low, now off all meds 4. Morbid obesity: &obstructive sleep apnea-he declined CPAP 5. LUE AVF functioning well 6. Prob right foot gout(radiographic).  Improved on Colchicine  Subjective: Interval History: Foot feels better  Objective: Vital signs in last 24 hours: Temp:  [97.9 F (36.6 C)-98.4 F (36.9 C)] 97.9 F (36.6 C) (09/08 0532) Pulse Rate:  [80-84] 80 (09/08 0532) Resp:  [18] 18 (09/08 0532) BP: (104-112)/(62-77) 112/77 (09/08 0532) SpO2:  [99 %-100 %] 100 % (09/08 0532) Weight:  [179.8 kg (396 lb 6.4 oz)] 179.8 kg (396 lb 6.4 oz) (09/08 0532) Weight change:   Intake/Output from previous day: 09/07 0701 - 09/08 0700 In: 360 [P.O.:360] Out: 2923  Intake/Output this shift: No intake/output data recorded.  GI: soft, non-tender; bowel sounds normal; no masses,  no organomegaly and protuberant Extremities: chronic changes r foot less tender  Lab Results:  Recent Labs  05/21/17 0503  WBC 17.3*  HGB 8.0*  HCT 25.7*  PLT 292   BMET:  Recent Labs  05/21/17 0503  NA 137  K 4.4  CL 96*  CO2 28  GLUCOSE 115*  BUN 66*  CREATININE 6.73*  CALCIUM 9.8   No results for input(s): PTH in the last 72 hours. Iron Studies: No results for input(s): IRON, TIBC, TRANSFERRIN, FERRITIN in the last 72 hours. Studies/Results: Dg Foot 2 Views Right  Result Date: 05/20/2017 CLINICAL DATA:  Diffuse right foot pain.  Right foot swelling. EXAM: RIGHT FOOT - 2 VIEW COMPARISON:  None. FINDINGS: No acute bony or joint abnormality is identified. There is joint space narrowing and marginal erosions with overhanging edges at the IP joint of the great toe and first MTP joint. Joint space narrowing and erosions are also present about the tarsometatarsal joints, worst  at the second and third tarsometatarsal joints. No fracture or dislocation. Soft tissue swelling is present. IMPRESSION: No acute bony abnormality. Diffuse soft tissue swelling. Abnormal appearance of the first MTP joint, IP joint of the toe and tarsometatarsal joints highly suggestive of gouty arthropathy. Electronically Signed   By: Inge Rise M.D.   On: 05/20/2017 15:54    Scheduled: . aspirin EC  81 mg Oral Daily  . colchicine  0.6 mg Oral BID  . darbepoetin (ARANESP) injection - NON-DIALYSIS  200 mcg Subcutaneous Q Thu-1800  . heparin  5,000 Units Subcutaneous Q8H  . hydrocerin  1 application Topical Daily  . multivitamin  1 tablet Oral QHS  . polyethylene glycol  17 g Oral BID  . predniSONE  5 mg Oral Daily  . rOPINIRole  0.5 mg Oral QHS  . senna  1 tablet Oral QHS    LOS: 11 days   Sayaka Hoeppner C 05/22/2017,1:54 PM

## 2017-05-23 MED ORDER — COLCHICINE 0.6 MG PO TABS
0.6000 mg | ORAL_TABLET | Freq: Every day | ORAL | Status: DC
  Administered 2017-05-24 – 2017-05-25 (×2): 0.6 mg via ORAL
  Filled 2017-05-23 (×2): qty 1

## 2017-05-23 NOTE — Progress Notes (Signed)
Pt has refused cpap at this time. RT will monitor. 

## 2017-05-23 NOTE — Progress Notes (Signed)
PROGRESS NOTE    Noah Garner  ZOX:[id number] DOB: 1961-09-08 DOA: 05/11/2017 PCP: Delilah Shan, MD   Brief Narrative:  56 year old male presented with leg swelling. Patient does have history of systolic heart failure, chronic kidney disease stage IV, peripheral vascular disease, morbid obesity. Patient reported worsening lower extremity edema for the last 10 days, associated with local pain and dyspnea. On initial physical examination blood pressure 113/68, heart rate 87, respiratory rate 20, temperature 98.9, saturation 95%. Moist mucous membranes, lungs were clear to auscultation bilaterally, no wheezing rales or rhonchi, heart S1-S2 present rhythmic, no gallops, abdomen was nontender and nondistended, positive lower extremity edema 2+ pitting.   Patient was admitted to the hospital with the working diagnosis of worsening extremity edema, volume overload, complicated by suspected cellulitis.   Patient did not respond to medical therapy, developed worsening uremia, started on hemodialysis. Waiting for outpatient dialysis unit.   Has decided on SNF at [facility].  Awaiting dialysis location.  Assessment & Plan:   Principal Problem:   Wound infection Active Problems:   Acute on chronic combined systolic and diastolic CHF (congestive heart failure) (HCC)   Hypertension   Stasis ulcer (HCC)   Morbid obesity with alveolar hypoventilation (HCC)   Rheumatoid arthritis (HCC)   Anemia of chronic disease   Leukocytosis   ESRD (end stage renal disease) (HCC)   Cellulitis   Chronic renal insufficiency, stage 4 (severe) (HCC)   PVD (peripheral vascular disease) (HCC)   OSA (obstructive sleep apnea)   Dependence on renal dialysis ([place])   Acute blood loss anemia   1. Volume overload with worsening renal function, AKI on CKD stage 4-5 with uremic symptoms. ESRD. Patient on [facility] Monday-Wednesday_Friday schedule for HD, tolerating well renal replacement therapy, symptoms of uremia, edema and  dyspnea have been improving. Patient with deconditioning, consulted inpatient rehab for possible transfer.   2. HTN. BP's have been low while here.  Coreg discontinued by nephrology.   3. Anemia of chronic renal disease with iron deficiency. Stable.  If continue worsening may need prbc transfusion, ideally during HD. No signs of active bleeding, patient on Aranesp and had IV iron.   4. Morbid obesity. Continue DVTpx. Patient with significant difficulty getting out of the bed, follow with inpatient rehab, recommendations.   5. Left leg ulceration. Clinically improving, will continue with local wound care. "Wash and dry bilateral lower legs, apply Eucerin cream daily prior to the wound care on left lateral leg, cleanse with NS, pat dry, apply Calcium Alginate dressing, perform daily."  6. Leukocytosis: stably elevated.  CTM. He doesn't seem to have any signs or symptoms of systemic infection at this time.     7. R foot pain, imaging c/w gout: colchicine, improved pain.  DVT prophylaxis: heparin Code Status: full Family Communication: none Disposition Plan: snf  Consultants:   nephrology  Procedures: (Don't include imaging studies which can be auto populated. Include things that cannot be auto populated i.e. Echo, Carotid and venous dopplers, Foley, Bipap, HD, tubes/drains, wound vac, central lines etc)  hemodialysis  Antimicrobials: (specify start and planned stop date. Auto populated tables are space occupying and do not give end dates) Anti-infectives    Start     Dose/Rate Route Frequency Ordered Stop   05/12/17 0800  piperacillin-tazobactam (ZOSYN) IVPB 3.375 g     3.375 g 100 mL/hr over 30 Minutes Intravenous  Once 05/11/17 2357 05/12/17 0910   05/12/17 0015  vancomycin (VANCOCIN) 1,500 mg in sodium chloride 0.9 % 500  mL IVPB     1,500 mg 250 mL/hr over 120 Minutes Intravenous  Once 05/12/17 0012 05/12/17 0536   05/11/17 2315  cefTRIAXone (ROCEPHIN) 1 g in dextrose 5 %  50 mL IVPB     1 g 100 mL/hr over 30 Minutes Intravenous  Once 05/11/17 2307 05/12/17 0022   05/11/17 2315  vancomycin (VANCOCIN) IVPB 1000 mg/200 mL premix     1,000 mg 200 mL/hr over 60 Minutes Intravenous  Once 05/11/17 2307 05/12/17 0045         Subjective: Looking forward to football today.  Foot is better.  Objective: Vitals:   05/22/17 0532 05/22/17 1602 05/22/17 2101 05/23/17 0504  BP: 112/77 98/67 113/66 105/65  Pulse: 80 81 84 82  Resp: 18 18 18 17   Temp: 97.9 F (36.6 C) (!) 97.5 F (36.4 C) 99.6 F (37.6 C) 98.5 F (36.9 C)  TempSrc:  Oral Oral Oral  SpO2: 100% 95% 93% 97%  Weight: (!) 179.8 kg (396 lb 6.4 oz)   (!) 162.4 kg (358 lb)  Height:        Intake/Output Summary (Last 24 hours) at 05/23/17 1121 Last data filed at 05/23/17 0511  Gross per 24 hour  Intake              120 ml  Output              100 ml  Net               20 ml   Filed Weights   05/22/17 0532 05/23/17 0504  Weight: (!) 179.8 kg (396 lb 6.4 oz) (!) 162.4 kg (358 lb)    Examination:  General: No acute distress. Cardiovascular: Heart sounds show Noah Garner regular rate, and rhythm. No gallops or rubs. No murmurs. No JVD. Lungs: Clear to auscultation bilaterally with good air movement. No rales, rhonchi or wheezes. Abdomen: Soft, nontender, nondistended with normal active bowel sounds. No masses. No hepatosplenomegaly. Neurological: Alert and oriented 3. Moves all extremities 4 with equal strength. Cranial nerves II through XII grossly intact. Skin: Warm and dry. Dressing over L lateral leg ulcer.   Extremities: No clubbing or cyanosis. R foot improved pain Psychiatric: Mood and affect are normal. Insight and judgment are appropriate.  Data Reviewed: I have personally reviewed following labs and imaging studies  CBC:  Recent Labs Lab 05/17/17 0821 05/19/17 0409 05/21/17 0503  WBC 17.7* 17.1* 17.3*  NEUTROABS  --  13.1*  --   HGB 7.8* 7.7* 8.0*  HCT 23.9* 24.7* 25.7*  MCV 80.7  82.9 83.2  PLT 334 307 195   Basic Metabolic Panel:  Recent Labs Lab 05/17/17 0332 05/17/17 0821 05/18/17 0419 05/19/17 0409 05/21/17 0503  NA 138 137 137 138 137  K 3.6 3.7 4.3 4.7 4.4  CL 99* 99* 99* 100* 96*  CO2 28 26 30 29 28   GLUCOSE 137* 126* 153* 127* 115*  BUN 74* 76* 57* 75* 66*  CREATININE 4.94* 5.13* 4.77* 5.89* 6.73*  CALCIUM 9.3 9.4 9.3 9.5 9.8  PHOS  --  4.0  --   --   --    GFR: Estimated Creatinine Clearance: 19.1 mL/min (Noah Garner) (by C-G formula based on SCr of 6.73 mg/dL (H)). Liver Function Tests:  Recent Labs Lab 05/17/17 0821 05/21/17 0503  AST  --  27  ALT  --  33  ALKPHOS  --  95  BILITOT  --  0.6  PROT  --  7.7  ALBUMIN  2.3* 2.5*   No results for input(s): LIPASE, AMYLASE in the last 168 hours. No results for input(s): AMMONIA in the last 168 hours. Coagulation Profile: No results for input(s): INR, PROTIME in the last 168 hours. Cardiac Enzymes: No results for input(s): CKTOTAL, CKMB, CKMBINDEX, TROPONINI in the last 168 hours. BNP (last 3 results) No results for input(s): PROBNP in the last 8760 hours. HbA1C: No results for input(s): HGBA1C in the last 72 hours. CBG: No results for input(s): GLUCAP in the last 168 hours. Lipid Profile: No results for input(s): CHOL, HDL, LDLCALC, TRIG, CHOLHDL, LDLDIRECT in the last 72 hours. Thyroid Function Tests: No results for input(s): TSH, T4TOTAL, FREET4, T3FREE, THYROIDAB in the last 72 hours. Anemia Panel: No results for input(s): VITAMINB12, FOLATE, FERRITIN, TIBC, IRON, RETICCTPCT in the last 72 hours. Sepsis Labs: No results for input(s): PROCALCITON, LATICACIDVEN in the last 168 hours.  No results found for this or any previous visit (from the past 240 hour(s)).       Radiology Studies: No results found.      Scheduled Meds: . aspirin EC  81 mg Oral Daily  . colchicine  0.6 mg Oral BID  . darbepoetin (ARANESP) injection - NON-DIALYSIS  200 mcg Subcutaneous Q Thu-1800  .  heparin  5,000 Units Subcutaneous Q8H  . hydrocerin  1 application Topical Daily  . multivitamin  1 tablet Oral QHS  . polyethylene glycol  17 g Oral BID  . predniSONE  5 mg Oral Daily  . rOPINIRole  0.5 mg Oral QHS  . senna  1 tablet Oral QHS   Continuous Infusions:   LOS: 12 days    Time spent: 15 minutes    Noah Voit Melven Sartorius, MD [facility] [phone]  If 7PM-7AM, please contact night-coverage [email or web address] Password [email or web address] 05/23/2017, 11:21 AM

## 2017-05-23 NOTE — Progress Notes (Signed)
1. End stage renal disease-new start--MWF Next HD in AM. Plan is for him to go to [facility] and we now await dialysis unit location.  Hopeful for discharge 1-2 days 2. Bilateral leg swelling/infx:secondary to volume overload/infx-improved 3. Hypertension:Blood pressure now low, now off all meds 4. Morbid obesity: &obstructive sleep apnea-he declined CPAP 5. LUE AVF functioning well 6. Right foot gout.  Improved on Colchicine, dose reduced to QD  Subjective: Interval History: Foot better. Reports he was OOB(? Not sure)  Objective: Vital signs in last 24 hours: Temp:  [97.5 F (36.4 C)-99.6 F (37.6 C)] 98.5 F (36.9 C) (09/09 0504) Pulse Rate:  [81-84] 82 (09/09 0504) Resp:  [17-18] 17 (09/09 0504) BP: (98-113)/(65-67) 105/65 (09/09 0504) SpO2:  [93 %-97 %] 97 % (09/09 0504) Weight:  [162.4 kg (358 lb)] 162.4 kg (358 lb) (09/09 0504) Weight change: -17.418 kg (-38 lb 6.4 oz)  Intake/Output from previous day: 09/08 0701 - 09/09 0700 In: 120 [P.O.:120] Out: 100 [Urine:100] Intake/Output this shift: No intake/output data recorded.  General appearance: alert and cooperative Back: symmetric, no curvature. ROM normal. No CVA tenderness. Resp: clear to auscultation bilaterally Cardio: regular rate and rhythm, S1, S2 normal, no murmur, click, rub or gallop Extremities: chronic cobble stoning and bandaged LLE  Lab Results:  Recent Labs  05/21/17 0503  WBC 17.3*  HGB 8.0*  HCT 25.7*  PLT 292   BMET:  Recent Labs  05/21/17 0503  NA 137  K 4.4  CL 96*  CO2 28  GLUCOSE 115*  BUN 66*  CREATININE 6.73*  CALCIUM 9.8   No results for input(s): PTH in the last 72 hours. Iron Studies: No results for input(s): IRON, TIBC, TRANSFERRIN, FERRITIN in the last 72 hours. Studies/Results: No results found.  Scheduled: . aspirin EC  81 mg Oral Daily  . [START ON 05/24/2017] colchicine  0.6 mg Oral Daily  . darbepoetin (ARANESP) injection - NON-DIALYSIS  200 mcg  Subcutaneous Q Thu-1800  . heparin  5,000 Units Subcutaneous Q8H  . hydrocerin  1 application Topical Daily  . multivitamin  1 tablet Oral QHS  . polyethylene glycol  17 g Oral BID  . predniSONE  5 mg Oral Daily  . rOPINIRole  0.5 mg Oral QHS  . senna  1 tablet Oral QHS     LOS: 12 days   Read Bonelli C 05/23/2017,12:02 PM

## 2017-05-24 LAB — CBC
HEMATOCRIT: 25.3 % — AB (ref 39.0–52.0)
HEMOGLOBIN: 8.4 g/dL — AB (ref 13.0–17.0)
MCH: 27.1 pg (ref 26.0–34.0)
MCHC: 33.2 g/dL (ref 30.0–36.0)
MCV: 81.6 fL (ref 78.0–100.0)
Platelets: 259 10*3/uL (ref 150–400)
RBC: 3.1 MIL/uL — ABNORMAL LOW (ref 4.22–5.81)
RDW: 16.5 % — AB (ref 11.5–15.5)
WBC: 16.8 10*3/uL — AB (ref 4.0–10.5)

## 2017-05-24 LAB — URIC ACID: URIC ACID, SERUM: 11 mg/dL — AB (ref 4.4–7.6)

## 2017-05-24 LAB — RENAL FUNCTION PANEL
ALBUMIN: 2.5 g/dL — AB (ref 3.5–5.0)
Anion gap: 15 (ref 5–15)
BUN: 91 mg/dL — ABNORMAL HIGH (ref 6–20)
CALCIUM: 9.8 mg/dL (ref 8.9–10.3)
CO2: 25 mmol/L (ref 22–32)
CREATININE: 9.29 mg/dL — AB (ref 0.61–1.24)
Chloride: 94 mmol/L — ABNORMAL LOW (ref 101–111)
GFR calc Af Amer: 6 mL/min — ABNORMAL LOW (ref >60)
GFR calc non Af Amer: 6 mL/min — ABNORMAL LOW (ref >60)
GLUCOSE: 105 mg/dL — AB (ref 65–99)
PHOSPHORUS: 8.2 mg/dL — AB (ref 2.5–4.6)
Potassium: 4.6 mmol/L (ref 3.5–5.1)
SODIUM: 134 mmol/L — AB (ref 135–145)

## 2017-05-24 MED ORDER — HEPARIN SODIUM (PORCINE) 1000 UNIT/ML DIALYSIS
20.0000 [IU]/kg | INTRAMUSCULAR | Status: DC | PRN
Start: 2017-05-24 — End: 2017-05-24

## 2017-05-24 MED ORDER — SODIUM CHLORIDE 0.9 % IV SOLN: 100.0000 mL | INTRAVENOUS | Status: DC | PRN

## 2017-05-24 MED ORDER — PENTAFLUOROPROP-TETRAFLUOROETH EX AERO: 1.0000 "application " | INHALATION_SPRAY | CUTANEOUS | Status: DC | PRN

## 2017-05-24 MED ORDER — ALTEPLASE 2 MG IJ SOLR: 2.0000 mg | Freq: Once | INTRAMUSCULAR | Status: DC | PRN

## 2017-05-24 MED ORDER — LIDOCAINE-PRILOCAINE 2.5-2.5 % EX CREA: 1.0000 "application " | TOPICAL_CREAM | CUTANEOUS | Status: DC | PRN

## 2017-05-24 MED ORDER — HEPARIN SODIUM (PORCINE) 1000 UNIT/ML DIALYSIS: 1000.0000 [IU] | INTRAMUSCULAR | Status: DC | PRN

## 2017-05-24 MED ORDER — HEPARIN SODIUM (PORCINE) 1000 UNIT/ML DIALYSIS: 20.0000 [IU]/kg | INTRAMUSCULAR | Status: DC | PRN

## 2017-05-24 MED ORDER — LIDOCAINE HCL (PF) 1 % IJ SOLN: 5.0000 mL | INTRAMUSCULAR | Status: DC | PRN

## 2017-05-24 MED ORDER — DARBEPOETIN ALFA 200 MCG/0.4ML IJ SOSY: 200.0000 ug | PREFILLED_SYRINGE | INTRAMUSCULAR | Status: DC

## 2017-05-24 NOTE — Clinical Social Work Note (Signed)
Pt can transfer to [facility] once clipped.   [place], [place]

## 2017-05-24 NOTE — Progress Notes (Signed)
Pt refuses cpap at this time.  RT will monitor. 

## 2017-05-24 NOTE — Procedures (Signed)
Patient seen and examined on Hemodialysis. QB 300 UF goal 3.5L. Tolerating well.  Treatment adjusted as needed.  Madelon Lips MD Eliette Drumwright [facility]  pgr [phone] 8:05 AM

## 2017-05-24 NOTE — Progress Notes (Signed)
Inpatient Rehabilitation  Note plans for discharge to SNF.  Unable to review updated PT notes, but likely still not at a level appropriate for IP Rehab.  Will sign off. Call if questions.   Carmelia Roller., CCC/SLP Admission Coordinator  [place]  Cell [phone]

## 2017-05-24 NOTE — Progress Notes (Signed)
Pt was off the unit during the AM, he came back to the floor at 1330. Wound care was completed at 1700.

## 2017-05-24 NOTE — Progress Notes (Signed)
PT Cancellation Note  Patient Details Name: Noah Garner MRN: [id number] DOB: 1960-10-02   Cancelled Treatment:    Reason Eval/Treat Not Completed: Patient at procedure or test/unavailable (pt in HD and unavailable)   Jarrett Chicoine B Rashea Hoskie 05/24/2017, 9:19 AM  Elwyn Reach, [place]

## 2017-05-24 NOTE — Progress Notes (Signed)
Pt arrived back to floor from hemodialysis via bed.

## 2017-05-24 NOTE — Progress Notes (Signed)
1. End stage renal disease-new start--MWF Next HD today. Plan is for him to go to [facility] and we now await dialysis unit location- in the process of CLIP.  Hopeful for discharge 1-2 days. 2. Bilateral leg swelling/infx:secondary to volume overload/infx-improved 3. Hypertension:Blood pressure now low, now off all meds 4. Morbid obesity: &obstructive sleep apnea-he declined CPAP 5. LUE AVF functioning well 6. Right foot gout.  Improved on Colchicine, dose reduced to QD  Subjective: Interval History: No complaints today  Objective: Vital signs in last 24 hours: Temp:  [98.2 F (36.8 C)-98.5 F (36.9 C)] 98.3 F (36.8 C) (09/10 0735) Pulse Rate:  [78-85] 78 (09/10 0745) Resp:  [18-20] 18 (09/10 0735) BP: (96-130)/(61-92) 119/87 (09/10 0745) SpO2:  [94 %-100 %] 94 % (09/10 0735) Weight:  [134.5 kg (296 lb 8.3 oz)] 134.5 kg (296 lb 8.3 oz) (09/10 0735) Weight change:   Intake/Output from previous day: 09/09 0701 - 09/10 0700 In: 60 [P.O.:60] Out: 25 [Urine:25] Intake/Output this shift: No intake/output data recorded.  General appearance: alert and cooperative Back: symmetric, no curvature. ROM normal. No CVA tenderness. Resp: clear to auscultation bilaterally Cardio: regular rate and rhythm, S1, S2 normal, no murmur, click, rub or gallop Extremities: chronic cobble stoning and bandaged LLE  Lab Results:  Recent Labs  05/24/17 0728  WBC 16.8*  HGB 8.4*  HCT 25.3*  PLT 259   BMET: No results for input(s): NA, K, CL, CO2, GLUCOSE, BUN, CREATININE, CALCIUM in the last 72 hours. No results for input(s): PTH in the last 72 hours. Iron Studies: No results for input(s): IRON, TIBC, TRANSFERRIN, FERRITIN in the last 72 hours. Studies/Results: No results found.  Scheduled: . aspirin EC  81 mg Oral Daily  . colchicine  0.6 mg Oral Daily  . darbepoetin (ARANESP) injection - NON-DIALYSIS  200 mcg Subcutaneous Q Thu-1800  . heparin  5,000 Units Subcutaneous Q8H  .  hydrocerin  1 application Topical Daily  . multivitamin  1 tablet Oral QHS  . polyethylene glycol  17 g Oral BID  . predniSONE  5 mg Oral Daily  . rOPINIRole  0.5 mg Oral QHS  . senna  1 tablet Oral QHS     LOS: 13 days   Janari Yamada 05/24/2017,8:03 AM

## 2017-05-24 NOTE — Progress Notes (Signed)
PROGRESS NOTE    Noah Garner  KGY:[id number] DOB: 1961/05/25 DOA: 05/11/2017 PCP: Delilah Shan, MD   Brief Narrative:  56 year old male presented with leg swelling. Patient does have history of systolic heart failure, chronic kidney disease stage IV, peripheral vascular disease, morbid obesity. Patient reported worsening lower extremity edema for the last 10 days, associated with local pain and dyspnea. On initial physical examination blood pressure 113/68, heart rate 87, respiratory rate 20, temperature 98.9, saturation 95%. Moist mucous membranes, lungs were clear to auscultation bilaterally, no wheezing rales or rhonchi, heart S1-S2 present rhythmic, no gallops, abdomen was nontender and nondistended, positive lower extremity edema 2+ pitting.   Patient was admitted to the hospital with the working diagnosis of worsening extremity edema, volume overload, complicated by suspected cellulitis.   Patient did not respond to medical therapy, developed worsening uremia, started on hemodialysis. Waiting for outpatient dialysis unit.   Has decided on SNF at [facility].  Awaiting dialysis location.  Assessment & Plan:   Principal Problem:   Wound infection Active Problems:   Acute on chronic combined systolic and diastolic CHF (congestive heart failure) (HCC)   Hypertension   Stasis ulcer (HCC)   Morbid obesity with alveolar hypoventilation (HCC)   Rheumatoid arthritis (HCC)   Anemia of chronic disease   Leukocytosis   ESRD (end stage renal disease) (HCC)   Cellulitis   Chronic renal insufficiency, stage 4 (severe) (HCC)   PVD (peripheral vascular disease) (HCC)   OSA (obstructive sleep apnea)   Dependence on renal dialysis ([place])   Acute blood loss anemia   1. Volume overload with worsening renal function, AKI on CKD stage 4-5 with uremic symptoms. ESRD. Patient on a Monday-Wednesday_Friday schedule for HD, tolerating well renal replacement therapy, symptoms of uremia, edema and  dyspnea have been improving. Patient with deconditioning, not candidate for inpatient rehab per most recent note.   2. HTN. BP's have been low while here.  Coreg discontinued by nephrology.   3. Anemia of chronic renal disease with iron deficiency. Stable.  If continue worsening may need prbc transfusion, ideally during HD. No signs of active bleeding, patient on Aranesp and had IV iron.   4. Morbid obesity. Continue DVTpx. Patient with significant difficulty getting out of the bed, follow with inpatient rehab, recommendations.   5. Left leg ulceration. Clinically improving, will continue with local wound care. "Wash and dry bilateral lower legs, apply Eucerin cream daily prior to the wound care on left lateral leg, cleanse with NS, pat dry, apply Calcium Alginate dressing, perform daily."  6. Leukocytosis: stably elevated.  CTM. He doesn't seem to have any signs or symptoms of systemic infection at this time.     7. R foot pain, imaging c/w gout: colchicine, improved pain, but still present.  Continue at reduced dose.   DVT prophylaxis: heparin Code Status: full Family Communication: none Disposition Plan: snf  Consultants:   nephrology  Procedures: (Don't include imaging studies which can be auto populated. Include things that cannot be auto populated i.e. Echo, Carotid and venous dopplers, Foley, Bipap, HD, tubes/drains, wound vac, central lines etc)  hemodialysis  Antimicrobials: (specify start and planned stop date. Auto populated tables are space occupying and do not give end dates) Anti-infectives    Start     Dose/Rate Route Frequency Ordered Stop   05/12/17 0800  piperacillin-tazobactam (ZOSYN) IVPB 3.375 g     3.375 g 100 mL/hr over 30 Minutes Intravenous  Once 05/11/17 2357 05/12/17 0910   05/12/17  0015  vancomycin (VANCOCIN) 1,500 mg in sodium chloride 0.9 % 500 mL IVPB     1,500 mg 250 mL/hr over 120 Minutes Intravenous  Once 05/12/17 0012 05/12/17 0536    05/11/17 2315  cefTRIAXone (ROCEPHIN) 1 g in dextrose 5 % 50 mL IVPB     1 g 100 mL/hr over 30 Minutes Intravenous  Once 05/11/17 2307 05/12/17 0022   05/11/17 2315  vancomycin (VANCOCIN) IVPB 1000 mg/200 mL premix     1,000 mg 200 mL/hr over 60 Minutes Intravenous  Once 05/11/17 2307 05/12/17 0045         Subjective: Foot still bothering him, that's about ti.   Objective: Vitals:   05/24/17 1100 05/24/17 1130 05/24/17 1153 05/24/17 2014  BP: (!) 112/93 96/77 120/78 114/78  Pulse: 66 90 68 88  Resp:   17 19  Temp:   97.8 F (36.6 C) 98.7 F (37.1 C)  TempSrc:   Oral Oral  SpO2:   95% 97%  Weight:      Height:        Intake/Output Summary (Last 24 hours) at 05/24/17 2033 Last data filed at 05/24/17 1926  Gross per 24 hour  Intake              700 ml  Output             2793 ml  Net            -2093 ml   Filed Weights   05/22/17 0532 05/23/17 0504 05/24/17 0735  Weight: (!) 179.8 kg (396 lb 6.4 oz) (!) 162.4 kg (358 lb) 134.5 kg (296 lb 8.3 oz)    Examination:  General: No acute distress. Cardiovascular: Heart sounds show a regular rate, and rhythm. No gallops or rubs. No murmurs. No JVD. Lungs: Clear to auscultation bilaterally with good air movement. No rales, rhonchi or wheezes. Abdomen: Soft, nontender, nondistended with normal active bowel sounds. No masses. No hepatosplenomegaly. Neurological: Alert and oriented 3. Moves all extremities 4 with equal strength. Cranial nerves II through XII grossly intact. Skin: Warm and dry. No rashes or lesions. Extremities: LLE wound healing well, R foot with TTP at dorsum of foot and at first toe Psychiatric: Mood and affect are normal. Insight and judgment are appropriate.  Data Reviewed: I have personally reviewed following labs and imaging studies  CBC:  Recent Labs Lab 05/19/17 0409 05/21/17 0503 05/24/17 0728  WBC 17.1* 17.3* 16.8*  NEUTROABS 13.1*  --   --   HGB 7.7* 8.0* 8.4*  HCT 24.7* 25.7* 25.3*  MCV  82.9 83.2 81.6  PLT 307 292 099   Basic Metabolic Panel:  Recent Labs Lab 05/18/17 0419 05/19/17 0409 05/21/17 0503 05/24/17 0728  NA 137 138 137 134*  K 4.3 4.7 4.4 4.6  CL 99* 100* 96* 94*  CO2 30 29 28 25   GLUCOSE 153* 127* 115* 105*  BUN 57* 75* 66* 91*  CREATININE 4.77* 5.89* 6.73* 9.29*  CALCIUM 9.3 9.5 9.8 9.8  PHOS  --   --   --  8.2*   GFR: Estimated Creatinine Clearance: 12.4 mL/min (A) (by C-G formula based on SCr of 9.29 mg/dL (H)). Liver Function Tests:  Recent Labs Lab 05/21/17 0503 05/24/17 0728  AST 27  --   ALT 33  --   ALKPHOS 95  --   BILITOT 0.6  --   PROT 7.7  --   ALBUMIN 2.5* 2.5*   No results for input(s): LIPASE, AMYLASE  in the last 168 hours. No results for input(s): AMMONIA in the last 168 hours. Coagulation Profile: No results for input(s): INR, PROTIME in the last 168 hours. Cardiac Enzymes: No results for input(s): CKTOTAL, CKMB, CKMBINDEX, TROPONINI in the last 168 hours. BNP (last 3 results) No results for input(s): PROBNP in the last 8760 hours. HbA1C: No results for input(s): HGBA1C in the last 72 hours. CBG: No results for input(s): GLUCAP in the last 168 hours. Lipid Profile: No results for input(s): CHOL, HDL, LDLCALC, TRIG, CHOLHDL, LDLDIRECT in the last 72 hours. Thyroid Function Tests: No results for input(s): TSH, T4TOTAL, FREET4, T3FREE, THYROIDAB in the last 72 hours. Anemia Panel: No results for input(s): VITAMINB12, FOLATE, FERRITIN, TIBC, IRON, RETICCTPCT in the last 72 hours. Sepsis Labs: No results for input(s): PROCALCITON, LATICACIDVEN in the last 168 hours.  No results found for this or any previous visit (from the past 240 hour(s)).       Radiology Studies: No results found.      Scheduled Meds: . aspirin EC  81 mg Oral Daily  . colchicine  0.6 mg Oral Daily  . [START ON 05/27/2017] darbepoetin (ARANESP) injection - DIALYSIS  200 mcg Intravenous Q Thu-HD  . heparin  5,000 Units Subcutaneous  Q8H  . hydrocerin  1 application Topical Daily  . multivitamin  1 tablet Oral QHS  . polyethylene glycol  17 g Oral BID  . predniSONE  5 mg Oral Daily  . rOPINIRole  0.5 mg Oral QHS  . senna  1 tablet Oral QHS   Continuous Infusions:   LOS: 13 days    Time spent: 15 minutes    Fayrene Helper, MD [facility] [phone]  If 7PM-7AM, please contact night-coverage [email or web address] Password [facility] 05/24/2017, 8:33 PM

## 2017-05-25 DIAGNOSIS — D62 Acute posthemorrhagic anemia: Secondary | ICD-10-CM | POA: Diagnosis not present

## 2017-05-25 DIAGNOSIS — D638 Anemia in other chronic diseases classified elsewhere: Secondary | ICD-10-CM | POA: Diagnosis not present

## 2017-05-25 DIAGNOSIS — N2581 Secondary hyperparathyroidism of renal origin: Secondary | ICD-10-CM | POA: Diagnosis not present

## 2017-05-25 DIAGNOSIS — N184 Chronic kidney disease, stage 4 (severe): Secondary | ICD-10-CM | POA: Diagnosis not present

## 2017-05-25 DIAGNOSIS — M1A39X0 Chronic gout due to renal impairment, multiple sites, without tophus (tophi): Secondary | ICD-10-CM | POA: Diagnosis not present

## 2017-05-25 DIAGNOSIS — S8012XA Contusion of left lower leg, initial encounter: Secondary | ICD-10-CM | POA: Diagnosis not present

## 2017-05-25 DIAGNOSIS — I129 Hypertensive chronic kidney disease with stage 1 through stage 4 chronic kidney disease, or unspecified chronic kidney disease: Secondary | ICD-10-CM | POA: Diagnosis not present

## 2017-05-25 DIAGNOSIS — I83022 Varicose veins of left lower extremity with ulcer of calf: Secondary | ICD-10-CM | POA: Diagnosis not present

## 2017-05-25 DIAGNOSIS — I1311 Hypertensive heart and chronic kidney disease without heart failure, with stage 5 chronic kidney disease, or end stage renal disease: Secondary | ICD-10-CM | POA: Diagnosis not present

## 2017-05-25 DIAGNOSIS — I5030 Unspecified diastolic (congestive) heart failure: Secondary | ICD-10-CM | POA: Diagnosis not present

## 2017-05-25 DIAGNOSIS — Z992 Dependence on renal dialysis: Secondary | ICD-10-CM | POA: Diagnosis not present

## 2017-05-25 DIAGNOSIS — I5042 Chronic combined systolic (congestive) and diastolic (congestive) heart failure: Secondary | ICD-10-CM | POA: Diagnosis not present

## 2017-05-25 DIAGNOSIS — L03119 Cellulitis of unspecified part of limb: Secondary | ICD-10-CM | POA: Diagnosis not present

## 2017-05-25 DIAGNOSIS — E662 Morbid (severe) obesity with alveolar hypoventilation: Secondary | ICD-10-CM | POA: Diagnosis not present

## 2017-05-25 DIAGNOSIS — M1040 Other secondary gout, unspecified site: Secondary | ICD-10-CM | POA: Diagnosis not present

## 2017-05-25 DIAGNOSIS — N179 Acute kidney failure, unspecified: Secondary | ICD-10-CM | POA: Diagnosis not present

## 2017-05-25 DIAGNOSIS — M1049 Other secondary gout, multiple sites: Secondary | ICD-10-CM | POA: Diagnosis not present

## 2017-05-25 DIAGNOSIS — I10 Essential (primary) hypertension: Secondary | ICD-10-CM | POA: Diagnosis not present

## 2017-05-25 DIAGNOSIS — I872 Venous insufficiency (chronic) (peripheral): Secondary | ICD-10-CM | POA: Diagnosis not present

## 2017-05-25 DIAGNOSIS — E669 Obesity, unspecified: Secondary | ICD-10-CM | POA: Diagnosis not present

## 2017-05-25 DIAGNOSIS — Z283 Underimmunization status: Secondary | ICD-10-CM | POA: Diagnosis not present

## 2017-05-25 DIAGNOSIS — N186 End stage renal disease: Secondary | ICD-10-CM | POA: Diagnosis not present

## 2017-05-25 DIAGNOSIS — J9601 Acute respiratory failure with hypoxia: Secondary | ICD-10-CM | POA: Diagnosis not present

## 2017-05-25 DIAGNOSIS — I5043 Acute on chronic combined systolic (congestive) and diastolic (congestive) heart failure: Secondary | ICD-10-CM | POA: Diagnosis not present

## 2017-05-25 DIAGNOSIS — R609 Edema, unspecified: Secondary | ICD-10-CM | POA: Diagnosis not present

## 2017-05-25 DIAGNOSIS — Z23 Encounter for immunization: Secondary | ICD-10-CM | POA: Diagnosis not present

## 2017-05-25 DIAGNOSIS — I739 Peripheral vascular disease, unspecified: Secondary | ICD-10-CM | POA: Diagnosis not present

## 2017-05-25 DIAGNOSIS — R600 Localized edema: Secondary | ICD-10-CM | POA: Diagnosis not present

## 2017-05-25 DIAGNOSIS — M6281 Muscle weakness (generalized): Secondary | ICD-10-CM | POA: Diagnosis not present

## 2017-05-25 DIAGNOSIS — R2689 Other abnormalities of gait and mobility: Secondary | ICD-10-CM | POA: Diagnosis not present

## 2017-05-25 DIAGNOSIS — D509 Iron deficiency anemia, unspecified: Secondary | ICD-10-CM | POA: Diagnosis not present

## 2017-05-25 DIAGNOSIS — R251 Tremor, unspecified: Secondary | ICD-10-CM | POA: Diagnosis not present

## 2017-05-25 DIAGNOSIS — G2581 Restless legs syndrome: Secondary | ICD-10-CM | POA: Diagnosis not present

## 2017-05-25 DIAGNOSIS — L03115 Cellulitis of right lower limb: Secondary | ICD-10-CM | POA: Diagnosis not present

## 2017-05-25 DIAGNOSIS — I5040 Unspecified combined systolic (congestive) and diastolic (congestive) heart failure: Secondary | ICD-10-CM | POA: Diagnosis not present

## 2017-05-25 DIAGNOSIS — L089 Local infection of the skin and subcutaneous tissue, unspecified: Secondary | ICD-10-CM | POA: Diagnosis not present

## 2017-05-25 DIAGNOSIS — L0390 Cellulitis, unspecified: Secondary | ICD-10-CM | POA: Diagnosis not present

## 2017-05-25 DIAGNOSIS — M05762 Rheumatoid arthritis with rheumatoid factor of left knee without organ or systems involvement: Secondary | ICD-10-CM | POA: Diagnosis not present

## 2017-05-25 DIAGNOSIS — D631 Anemia in chronic kidney disease: Secondary | ICD-10-CM | POA: Diagnosis not present

## 2017-05-25 DIAGNOSIS — M05761 Rheumatoid arthritis with rheumatoid factor of right knee without organ or systems involvement: Secondary | ICD-10-CM | POA: Diagnosis not present

## 2017-05-25 DIAGNOSIS — T148XXA Other injury of unspecified body region, initial encounter: Secondary | ICD-10-CM | POA: Diagnosis not present

## 2017-05-25 DIAGNOSIS — R739 Hyperglycemia, unspecified: Secondary | ICD-10-CM | POA: Diagnosis not present

## 2017-05-25 MED ORDER — POLYETHYLENE GLYCOL 3350 17 G PO PACK: 17.0000 g | PACK | Freq: Two times a day (BID) | ORAL | 0 refills | Status: AC

## 2017-05-25 MED ORDER — ROPINIROLE HCL 0.5 MG PO TABS: 0.5000 mg | ORAL_TABLET | Freq: Every day | ORAL | 0 refills | Status: DC

## 2017-05-25 MED ORDER — PREDNISONE 20 MG PO TABS
40.0000 mg | ORAL_TABLET | Freq: Once | ORAL | Status: DC
Start: 2017-05-25 — End: 2017-05-25

## 2017-05-25 MED ORDER — OXYCODONE-ACETAMINOPHEN 5-325 MG PO TABS: 1.0000 | ORAL_TABLET | Freq: Four times a day (QID) | ORAL | 0 refills | Status: DC | PRN

## 2017-05-25 MED ORDER — PREDNISONE 10 MG PO TABS: ORAL_TABLET | ORAL | 0 refills | Status: DC

## 2017-05-25 MED ORDER — SENNA 8.6 MG PO TABS: 1.0000 | ORAL_TABLET | Freq: Every day | ORAL | 0 refills | Status: AC

## 2017-05-25 MED ORDER — RENA-VITE PO TABS: 1.0000 | ORAL_TABLET | Freq: Every day | ORAL | 0 refills | Status: AC

## 2017-05-25 MED ORDER — HYDROCERIN EX CREA
1.0000 | TOPICAL_CREAM | Freq: Every day | CUTANEOUS | 0 refills | Status: DC
Start: 2017-05-26 — End: 2017-06-10

## 2017-05-25 NOTE — Clinical Social Work Placement (Signed)
   CLINICAL SOCIAL WORK PLACEMENT  NOTE  Date:  05/25/2017  Patient Details  Name: Geovani Tootle MRN: [id number] Date of Birth: December 22, 1960  Clinical Social Work is seeking post-discharge placement for this patient at the [place] level of care (*CSW will initial, date and re-position this form in  chart as items are completed):  Yes   Patient/family provided with [place] Work Department's list of facilities offering this level of care within the geographic area requested by the patient (or if unable, by the patient's family).  Yes   Patient/family informed of their freedom to choose among providers that offer the needed level of care, that participate in Medicare, Medicaid or managed care program needed by the patient, have an available bed and are willing to accept the patient.  Yes   Patient/family informed of 's ownership interest in [facility] and [facility], as well as of the fact that they are under no obligation to receive care at these facilities.  PASRR submitted to EDS on 05/18/17     PASRR number received on 05/18/17     Existing PASRR number confirmed on       FL2 transmitted to all facilities in geographic area requested by pt/family on 05/18/17     FL2 transmitted to all facilities within larger geographic area on       Patient informed that his/her managed care company has contracts with or will negotiate with certain facilities, including the following:        Yes   Patient/family informed of bed offers received.  Patient chooses bed at  [facility])     Physician recommends and patient chooses bed at      Patient to be transferred to  [facility]) on 05/25/17.  Patient to be transferred to facility by [facility]     Patient family notified on 05/25/17 of transfer.  Name of family member notified:  patient responsible for self     PHYSICIAN Please sign FL2, Please prepare  priority discharge summary, including medications, Please prepare prescriptions     Additional Comment:    _______________________________________________ Normajean Baxter, LCSW 05/25/2017, 4:09 PM

## 2017-05-25 NOTE — Social Work (Signed)
Clinical Social Worker facilitated patient discharge including contacting patient family and facility to confirm patient discharge plans.  Clinical information faxed to facility and family agreeable with plan.    CSW arranged ambulance transport via [facility] to [facility].    RN to call [phone] to give report prior to discharge.  Clinical Social Worker will sign off for now as social work intervention is no longer needed. Please consult [place] again if new need arises.  Elissa Hefty, LCSW Clinical Social Worker [phone]

## 2017-05-25 NOTE — Progress Notes (Addendum)
1. End stage renal disease-new start--MWF Next HD tomorrow. Plan is for him to go to [place] has been finalized- has been CLIP'd to [facility] and can start tomorrow 9/12.   2. Bilateral leg swelling:secondary to volume overload/infx-improved 3. Hypertension:Blood pressure now low, now off all meds 4. Morbid obesity: &obstructive sleep apnea-he declined CPAP 5. LUE AVF functioning well 6. Right foot gout.  Improved on Colchicine, dose reduced to QD  Subjective: Interval History: R foot still tender  Objective: Vital signs in last 24 hours: Temp:  [98 F (36.7 C)-98.7 F (37.1 C)] 98 F (36.7 C) (09/11 1400) Pulse Rate:  [80-88] 80 (09/11 1400) Resp:  [19-20] 20 (09/11 1400) BP: (114-122)/(73-78) 122/78 (09/11 1400) SpO2:  [93 %-97 %] 93 % (09/11 1400) Weight change:   Intake/Output from previous day: 09/10 0701 - 09/11 0700 In: 700 [P.O.:700] Out: 2918 [Urine:150] Intake/Output this shift: Total I/O In: 360 [P.O.:360] Out: 200 [Urine:200]  General appearance: alert and cooperative Resp: clear to auscultation bilaterally Cardio: regular rate and rhythm, S1, S2 normal, no murmur, click, rub or gallop Extremities: chronic cobble stoning and bandaged LLE MKSK: R foot with tenderness but no frank gout flare  Lab Results:  Recent Labs  05/24/17 0728  WBC 16.8*  HGB 8.4*  HCT 25.3*  PLT 259   BMET:   Recent Labs  05/24/17 0728  NA 134*  K 4.6  CL 94*  CO2 25  GLUCOSE 105*  BUN 91*  CREATININE 9.29*  CALCIUM 9.8   No results for input(s): PTH in the last 72 hours. Iron Studies: No results for input(s): IRON, TIBC, TRANSFERRIN, FERRITIN in the last 72 hours. Studies/Results: No results found.  Scheduled: . aspirin EC  81 mg Oral Daily  . colchicine  0.6 mg Oral Daily  . [START ON 05/27/2017] darbepoetin (ARANESP) injection - DIALYSIS  200 mcg Intravenous Q Thu-HD  . heparin  5,000 Units Subcutaneous Q8H  . hydrocerin  1 application Topical  Daily  . multivitamin  1 tablet Oral QHS  . polyethylene glycol  17 g Oral BID  . predniSONE  5 mg Oral Daily  . rOPINIRole  0.5 mg Oral QHS  . senna  1 tablet Oral QHS     LOS: 14 days   Shasha Buchbinder 05/25/2017,2:03 PM

## 2017-05-25 NOTE — Progress Notes (Signed)
Pt for discharge going to [place] report given to [facility], Rn, removed the peripheral IV line, dressing dry and intact in left lower leg, alert and oriented, no s/s of distress, given all his personal belongings, waiting for transport to pick him up.

## 2017-05-25 NOTE — Discharge Summary (Addendum)
Physician Discharge Summary  Noah Garner HKV:[id number] DOB: [birth year] DOA: 05/11/2017  PCP: Delilah Shan, MD  Admit date: 05/11/2017 Discharge date: 05/25/2017  Time spent: 30 minutes  Recommendations for Outpatient Follow-up:  1. Follow up with nephrology 2. F/u outpatient CBC/BMP 3. Continue wound care to LLE 4. Follow up gout flare, if not improving within the next few days, f/u with PCP for further evaluation.  5. Continue to monitor BP with discontinuation of meds  Discharge Diagnoses:  Principal Problem:   Wound infection Active Problems:   Acute on chronic combined systolic and diastolic CHF (congestive heart failure) (HCC)   Hypertension   Stasis ulcer (HCC)   Morbid obesity with alveolar hypoventilation (HCC)   Rheumatoid arthritis (HCC)   Anemia of chronic disease   Leukocytosis   ESRD (end stage renal disease) (HCC)   Cellulitis   Chronic renal insufficiency, stage 4 (severe) (HCC)   PVD (peripheral vascular disease) (HCC)   OSA (obstructive sleep apnea)   Dependence on renal dialysis ([place])   Acute blood loss anemia  Discharge Condition: stable  Diet recommendation: renal  Filed Weights   05/22/17 0532 05/23/17 0504 05/24/17 0735  Weight: (!) 179.8 kg (396 lb 6.4 oz) (!) 162.4 kg (358 lb) 134.5 kg (296 lb 8.3 oz)    History of present illness:  56 year old male presented with leg swelling. Patient does have history of systolic heart failure, chronic kidney disease stage IV, peripheral vascular disease, morbid obesity. Patient reported worsening lower extremity edema for the last 10 days, associated with local pain and dyspnea. On initial physical examination blood pressure 113/68, heart rate 87, respiratory rate 20, temperature 98.9, saturation 95%. Moist mucous membranes, lungs were clear to auscultation bilaterally, no wheezing rales or rhonchi, heart S1-S2 present rhythmic, no gallops, abdomen was nontender and nondistended, positive lower extremity edema  2+ pitting.   Patient was admitted to the hospital with the working diagnosis of worsening extremity edema, volume overload, complicated by suspected cellulitis (currently wound not infected, doing well with local wound care).   Patient did not respond to medical therapy, developed worsening uremia, started on hemodialysis.  D/c to snf.  Hospital Course:   1. Volume overload with worsening renal function, AKI on CKD stage 4-5 with uremic symptoms. ESRD. Patient on a Monday-Wednesday_Friday schedule for HD, tolerating well renal replacement therapy, symptoms of uremia, edema and dyspnea have been improving.   2. HTN. BP's have been low while here.  antihypertensives discontinued.  3. Anemia of chronic renal disease with iron deficiency. Stable.  On aranesp with HD.  4. Morbid obesity.    5. Left leg ulceration. Clinically improving, will continue with local wound care. "Wash and dry bilateral lower legs, apply Eucerin cream daily prior to the wound care on left lateral leg, cleanse with NS, pat dry, apply Calcium Alginate dressing, perform daily."  6. Leukocytosis: stably elevated.  CTM. He doesn't seem to have any signs or symptoms of systemic infection at this time.     7. R foot pain, imaging c/w gout:  Improved briefly with colchine, but then seemed to worsen.  Follow up response to prednisone    Procedures:  hemodialysis (i.e. Studies not automatically included, echos, thoracentesis, etc; not x-rays)  Consultations:  renal  Discharge Exam: Vitals:   05/25/17 0619 05/25/17 1400  BP: 118/73 122/78  Pulse: 88 80  Resp: 19 20  Temp: 98.1 F (36.7 C) 98 F (36.7 C)  SpO2: 97% 93%    General: No acute  distress. Cardiovascular: Heart sounds show a regular rate, and rhythm. No gallops or rubs. No murmurs. No JVD. Lungs: Clear to auscultation bilaterally with good air movement. No rales, rhonchi or wheezes. Abdomen: Soft, nontender, nondistended with normal  active bowel sounds. No masses. No hepatosplenomegaly. Neurological: Alert and oriented 3. Moves all extremities 4 with equal strength. Cranial nerves II through XII grossly intact. Skin: Warm and dry. No rashes or lesions. Extremities: TTP over dorsum of RLE, LLE lateral leg wound healing well  Discharge Instructions   Discharge Instructions    Call MD for:  difficulty breathing, headache or visual disturbances    Complete by:  As directed    Call MD for:  extreme fatigue    Complete by:  As directed    Call MD for:  persistant dizziness or light-headedness    Complete by:  As directed    Call MD for:  persistant nausea and vomiting    Complete by:  As directed    Call MD for:  redness, tenderness, or signs of infection (pain, swelling, redness, odor or green/yellow discharge around incision site)    Complete by:  As directed    Call MD for:  severe uncontrolled pain    Complete by:  As directed    Call MD for:  temperature >100.4    Complete by:  As directed    Diet renal 60/70-10-16-1198    Complete by:  As directed    Discharge instructions    Complete by:  As directed    For your gout, take prednisone 40 mg daily for 5 days, then take 30 mg daily for 3 days, take 20 mg for 3 days, take 10 mg for 3 days and resume your home 5 mg dose.  If your gout does not improve with the steroids after a few days, please follow up with your PCP.  Continue wound care for your left lower extremity.   Discharge instructions    Complete by:  As directed    Increase activity slowly    Complete by:  As directed    Wound care    Complete by:  As directed    Wash and dry bilateral lower legs, apply Eucerin cream daily prior to the wound care on left lateral leg, cleanse with NS, pat dry, apply Calcium Alginate dressing, perform daily.     Current Discharge Medication List    START taking these medications   Details  hydrocerin (EUCERIN) CREA Apply 1 application topically daily. Qty: 113 g,  Refills: 0    multivitamin (RENA-VIT) TABS tablet Take 1 tablet by mouth at bedtime. Qty: 30 tablet, Refills: 0    polyethylene glycol (MIRALAX / GLYCOLAX) packet Take 17 g by mouth 2 (two) times daily. Qty: 14 each, Refills: 0    !! predniSONE (DELTASONE) 10 MG tablet Take 40 mg (4 tabs) for 4 days, take 30 mg for 3 days, take 20 mg for 3 days, take 10 mg for 3 days, then resume home 5 mg dose. Qty: 34 tablet, Refills: 0    rOPINIRole (REQUIP) 0.5 MG tablet Take 1 tablet (0.5 mg total) by mouth at bedtime. Qty: 30 tablet, Refills: 0    senna (SENOKOT) 8.6 MG TABS tablet Take 1 tablet (8.6 mg total) by mouth at bedtime. Qty: 120 each, Refills: 0     !! - Potential duplicate medications found. Please discuss with provider.    CONTINUE these medications which have NOT CHANGED   Details  aspirin  EC 81 MG tablet Take 81 mg by mouth daily.    !! predniSONE (DELTASONE) 5 MG tablet Take 5 mg by mouth daily.     oxyCODONE-acetaminophen (PERCOCET/ROXICET) 5-325 MG tablet Take 1 tablet by mouth every 6 (six) hours as needed. Qty: 6 tablet, Refills: 0     !! - Potential duplicate medications found. Please discuss with provider.    STOP taking these medications     carvedilol (COREG) 25 MG tablet      furosemide (LASIX) 40 MG tablet      hydrALAZINE (APRESOLINE) 25 MG tablet        No Known Allergies Contact information for after-discharge care    Destination    [facility] .   Specialty:  [place] information: [address] [place] [phone]               The results of significant diagnostics from this hospitalization (including imaging, microbiology, ancillary and laboratory) are listed below for reference.    Significant Diagnostic Studies: Dg Chest 2 View  Result Date: 05/11/2017 CLINICAL DATA:  Right leg infection. Pain x 10 days.No chest pain or SOB.Hx of CHF. EXAM: CHEST  2  VIEW COMPARISON:  08/17/2015 FINDINGS: Borderline enlargement of the cardiac silhouette. No mediastinal or hilar masses. No convincing adenopathy. Lungs are clear.  No pleural effusion or pneumothorax. Skeletal structures are intact. IMPRESSION: No acute cardiopulmonary disease. Electronically Signed   By: Lajean Manes M.D.   On: 05/11/2017 20:13   Dg Foot 2 Views Right  Result Date: 05/20/2017 CLINICAL DATA:  Diffuse right foot pain.  Right foot swelling. EXAM: RIGHT FOOT - 2 VIEW COMPARISON:  None. FINDINGS: No acute bony or joint abnormality is identified. There is joint space narrowing and marginal erosions with overhanging edges at the IP joint of the great toe and first MTP joint. Joint space narrowing and erosions are also present about the tarsometatarsal joints, worst at the second and third tarsometatarsal joints. No fracture or dislocation. Soft tissue swelling is present. IMPRESSION: No acute bony abnormality. Diffuse soft tissue swelling. Abnormal appearance of the first MTP joint, IP joint of the toe and tarsometatarsal joints highly suggestive of gouty arthropathy. Electronically Signed   By: Inge Rise M.D.   On: 05/20/2017 15:54    Microbiology: No results found for this or any previous visit (from the past 240 hour(s)).   Labs: Basic Metabolic Panel:  Recent Labs Lab 05/19/17 0409 05/21/17 0503 05/24/17 0728  NA 138 137 134*  K 4.7 4.4 4.6  CL 100* 96* 94*  CO2 29 28 25   GLUCOSE 127* 115* 105*  BUN 75* 66* 91*  CREATININE 5.89* 6.73* 9.29*  CALCIUM 9.5 9.8 9.8  PHOS  --   --  8.2*   Liver Function Tests:  Recent Labs Lab 05/21/17 0503 05/24/17 0728  AST 27  --   ALT 33  --   ALKPHOS 95  --   BILITOT 0.6  --   PROT 7.7  --   ALBUMIN 2.5* 2.5*   No results for input(s): LIPASE, AMYLASE in the last 168 hours. No results for input(s): AMMONIA in the last 168 hours. CBC:  Recent Labs Lab 05/19/17 0409 05/21/17 0503 05/24/17 0728  WBC 17.1* 17.3*  16.8*  NEUTROABS 13.1*  --   --   HGB 7.7* 8.0* 8.4*  HCT 24.7* 25.7* 25.3*  MCV 82.9 83.2 81.6  PLT 307 292 259  Cardiac Enzymes: No results for input(s): CKTOTAL, CKMB, CKMBINDEX, TROPONINI in the last 168 hours. BNP: BNP (last 3 results)  Recent Labs  05/12/17 0524  BNP 34.2    ProBNP (last 3 results) No results for input(s): PROBNP in the last 8760 hours.  CBG: No results for input(s): GLUCAP in the last 168 hours.     Signed:  Fayrene Helper MD.  [facility] 05/25/2017, 3:58 PM

## 2017-05-25 NOTE — Clinical Social Work Placement (Signed)
   CLINICAL SOCIAL WORK PLACEMENT  NOTE  Date:  05/25/2017  Patient Details  Name: Noah [facility] MRN: [id number] Date of Birth: 03-09-1961  Clinical Social Work is seeking post-discharge placement for this patient at the [place] level of care (*CSW will initial, date and re-position this form in  chart as items are completed):  Yes   Patient/family provided with [facility] Department's list of facilities offering this level of care within the geographic area requested by the patient (or if unable, by the patient's family).  Yes   Patient/family informed of their freedom to choose among providers that offer the needed level of care, that participate in Medicare, Medicaid or managed care program needed by the patient, have an available bed and are willing to accept the patient.  Yes   Patient/family informed of [facility]'s ownership interest in [facility] and [facility], as well as of the fact that they are under no obligation to receive care at these facilities.  PASRR submitted to EDS on 05/18/17     PASRR number received on 05/18/17     Existing PASRR number confirmed on       FL2 transmitted to all facilities in geographic area requested by pt/family on 05/18/17     FL2 transmitted to all facilities within larger geographic area on       Patient informed that his/her managed care company has contracts with or will negotiate with certain facilities, including the following:        Yes   Patient/family informed of bed offers received.  Patient chooses bed at  [facility])     Physician recommends and patient chooses bed at      Patient to be transferred to  [facility]) on 05/25/17.  Patient to be transferred to facility by [facility]     Patient family notified on 05/25/17 of transfer.  Name of family member notified:  patient responsible for self     PHYSICIAN Please sign FL2, Please prepare  priority discharge summary, including medications, Please prepare prescriptions     Additional Comment:    _______________________________________________ Normajean Baxter, LCSW 05/25/2017, 4:03 PM

## 2017-05-25 NOTE — Progress Notes (Signed)
Physical Therapy Treatment Patient Details Name: Noah [facility] MRN: [id number] DOB: 10-26-1960 Today's Date: 05/25/2017    History of Present Illness 56 year old male presented with leg swelling. Patient does have history of systolic heart failure, chronic kidney disease stage IV, peripheral vascular disease, morbid obesity.  He was admitted with the working diagnosis of worsening extremity edema, volume overload, complicated by suspected cellulitis.  Patient did not respond to medical therapy, developed worsening uremia, started on hemodialysis.     PT Comments    Pt declining bed or OOB mobility today secondary to bilateral LE pain (R foot especially) despite max education on importance of mobility for current condition. Agreeable to and completed supine therex. According to pt and chart, plan to d/c to SNF for continued rehab. PT follow-up and frequency updated to reflect this. Will continue to follow acutely to maximize functional mobility.   Follow Up Recommendations  SNF     Equipment Recommendations  Other (comment) (defer to next venue)    Recommendations for Other Services       Precautions / Restrictions Precautions Precautions: Fall Restrictions Weight Bearing Restrictions: No    Mobility  Bed Mobility Overal bed mobility: Needs Assistance             General bed mobility comments: Pt refusing to sit EOB secondary to BLE pain despite max encouragement and education. Agreeable to supine therex  Transfers                    Ambulation/Gait                 Stairs            Wheelchair Mobility    Modified Rankin (Stroke Patients Only)       Balance Overall balance assessment: Needs assistance                                          Cognition Arousal/Alertness: Awake/alert Behavior During Therapy: WFL for tasks assessed/performed Overall Cognitive Status: Within Functional Limits for tasks assessed                                         Exercises Total Joint Exercises Ankle Circles/Pumps: AROM;Left;AAROM;Right;10 reps;Supine Heel Slides: AROM;Both;Supine;10 reps Hip ABduction/ADduction: AROM;Both;20 reps;Supine Straight Leg Raises: AROM;Both;20 reps;Supine Bridges: AROM;10 reps;Supine    General Comments        Pertinent Vitals/Pain Pain Assessment: Faces Faces Pain Scale: Hurts even more Pain Location: BLEs (R foot especially) Pain Descriptors / Indicators: Aching;Discomfort Pain Intervention(s): Limited activity within patient's tolerance;Monitored during session    Home Living                      Prior Function            PT Goals (current goals can now be found in the care plan section) Acute Rehab PT Goals Patient Stated Goal: to return to independent PT Goal Formulation: With patient Time For Goal Achievement: 05/24/17 Potential to Achieve Goals: Fair Progress towards PT goals: Progressing toward goals    Frequency    Min 2X/week      PT Plan Discharge plan needs to be updated;Frequency needs to be updated    Co-evaluation  AM-PAC PT "6 Clicks" Daily Activity  Outcome Measure  Difficulty turning over in bed (including adjusting bedclothes, sheets and blankets)?: Unable Difficulty moving from lying on back to sitting on the side of the bed? : Unable Difficulty sitting down on and standing up from a chair with arms (e.g., wheelchair, bedside commode, etc,.)?: Unable Help needed moving to and from a bed to chair (including a wheelchair)?: Total Help needed walking in hospital room?: Total Help needed climbing 3-5 steps with a railing? : Total 6 Click Score: 6    End of Session Equipment Utilized During Treatment: Gait belt Activity Tolerance: Patient limited by pain Patient left: in bed;with call bell/phone within reach Nurse Communication: Mobility status PT Visit Diagnosis: Muscle weakness (generalized)  (M62.81);Difficulty in walking, not elsewhere classified (R26.2);Pain Pain - Right/Left: Right (both) Pain - part of body: Ankle and joints of foot     Time: 2820-8138 PT Time Calculation (min) (ACUTE ONLY): 16 min  Charges:  $Therapeutic Exercise: 8-22 mins                    G Codes:      Mabeline Caras, PT, DPT Acute Rehab Services  Pager: [place] 05/25/2017, 11:50 AM

## 2017-05-25 NOTE — Care Management Important Message (Signed)
Important Message  Patient Details  Name: Noah Garner MRN: [id number] Date of Birth: 1960-11-14   Medicare Important Message Given:  Yes    Leveda Kendrix 05/25/2017, 11:19 AM

## 2017-05-25 NOTE — Progress Notes (Signed)
Pt was constipated refused miralax, refused, dulcolax even heparin, Dr. Florene Glen aware, no new order at this time.

## 2017-05-26 ENCOUNTER — Encounter: Payer: Self-pay | Admitting: Adult Health

## 2017-05-26 ENCOUNTER — Non-Acute Institutional Stay (SKILLED_NURSING_FACILITY): Payer: Medicare Other | Admitting: Adult Health

## 2017-05-26 DIAGNOSIS — M05762 Rheumatoid arthritis with rheumatoid factor of left knee without organ or systems involvement: Secondary | ICD-10-CM

## 2017-05-26 DIAGNOSIS — D62 Acute posthemorrhagic anemia: Secondary | ICD-10-CM

## 2017-05-26 DIAGNOSIS — I10 Essential (primary) hypertension: Secondary | ICD-10-CM

## 2017-05-26 DIAGNOSIS — N2581 Secondary hyperparathyroidism of renal origin: Secondary | ICD-10-CM | POA: Diagnosis not present

## 2017-05-26 DIAGNOSIS — I5043 Acute on chronic combined systolic (congestive) and diastolic (congestive) heart failure: Secondary | ICD-10-CM

## 2017-05-26 DIAGNOSIS — D631 Anemia in chronic kidney disease: Secondary | ICD-10-CM | POA: Diagnosis not present

## 2017-05-26 DIAGNOSIS — Z992 Dependence on renal dialysis: Secondary | ICD-10-CM | POA: Diagnosis not present

## 2017-05-26 DIAGNOSIS — N186 End stage renal disease: Secondary | ICD-10-CM | POA: Diagnosis not present

## 2017-05-26 DIAGNOSIS — I739 Peripheral vascular disease, unspecified: Secondary | ICD-10-CM | POA: Diagnosis not present

## 2017-05-26 DIAGNOSIS — M05761 Rheumatoid arthritis with rheumatoid factor of right knee without organ or systems involvement: Secondary | ICD-10-CM

## 2017-05-26 DIAGNOSIS — D509 Iron deficiency anemia, unspecified: Secondary | ICD-10-CM | POA: Diagnosis not present

## 2017-05-26 DIAGNOSIS — L03119 Cellulitis of unspecified part of limb: Secondary | ICD-10-CM

## 2017-05-26 NOTE — Progress Notes (Signed)
Location:   [facility] Room Number: [place] of Service:  SNF (31)   CODE STATUS: Full Code  No Known Allergies  Chief Complaint  Patient presents with  . Hospitalization Follow-up    Hospitalized from 05-11-17 through 05-25-17.     HPI:  He is a 56 year old man who has been hospitalized from 05-11-17 through 05-25-17 for volume overload with worsening renal function. Is now on hemodialysis. Leg leg venous stasis ulceration with topical treatment; and right foot pain with gout. He is here for short term rehab to improve upon his independence with his adls with his goal to return back home. He does have right foot pain due to gout and is on a prednisone taper. There are no nursing concerns at this time.   Past Medical History:  Diagnosis Date  . Anemia   . Arthritis   . CHF (congestive heart failure) ([place])   . Chronic kidney disease    stage 4  . Chronic systolic heart failure ([place]) 08/29/2015  . Hypertension   . Morbid obesity ([place])   . Rheumatoid arthritis ([place])   . Sleep apnea    wears CPAP    Past Surgical History:  Procedure Laterality Date  . AV FISTULA PLACEMENT Left 07/09/2016   Procedure: CREATION OF LEFT RADIOCEPHALIC ARTERIOVENOUS (AV) FISTULA;  Surgeon: Waynetta Sandy, MD;  Location: [facility];  Service: Vascular;  Laterality: Left;  . CARDIAC CATHETERIZATION     12/18/11: No sign CAD, elevated left heart pressures ([facility])  . KNEE SURGERY     torn ligaments    Social History   Social History  . Marital status: Single    Spouse name: N/A  . Number of children: N/A  . Years of education: N/A   Occupational History  . Not on file.   Social History Main Topics  . Smoking status: Former Smoker    Quit date: 08/12/1995  . Smokeless tobacco: Never Used  . Alcohol use No  . Drug use: No  . Sexual activity: Not on file   Other Topics Concern  . Not on file   Social History Narrative  . No narrative on file   Family  History  Problem Relation Age of Onset  . Hypertension Mother        VITAL SIGNS BP (!) 142/82   Pulse 80   Temp 98.4 F (36.9 C)   Resp 20   Ht 5\' 11"  (1.803 m)   SpO2 97%   Patient's Medications  New Prescriptions   No medications on file  Previous Medications   ASPIRIN EC 81 MG TABLET    Take 81 mg by mouth daily.   HYDROCERIN (EUCERIN) CREA    Apply 1 application topically daily.   MULTIVITAMIN (RENA-VIT) TABS TABLET    Take 1 tablet by mouth at bedtime.   OXYCODONE-ACETAMINOPHEN (PERCOCET/ROXICET) 5-325 MG TABLET    Take 1 tablet by mouth every 6 (six) hours as needed.   POLYETHYLENE GLYCOL (MIRALAX / GLYCOLAX) PACKET    Take 17 g by mouth 2 (two) times daily.   PREDNISONE (DELTASONE) 10 MG TABLET    Take 40 mg (4 tabs) for 4 days, take 30 mg for 3 days, take 20 mg for 3 days, take 10 mg for 3 days, then resume home 5 mg dose.   PREDNISONE (DELTASONE) 5 MG TABLET    Take 5 mg by mouth daily. Start date 05-30-17   ROPINIROLE (REQUIP) 0.5 MG  TABLET    Take 1 tablet (0.5 mg total) by mouth at bedtime.   SENNA (SENOKOT) 8.6 MG TABS TABLET    Take 1 tablet (8.6 mg total) by mouth at bedtime.  Modified Medications   No medications on file  Discontinued Medications   No medications on file     SIGNIFICANT DIAGNOSTIC EXAMS  TODAY:  05-11-17: chest x-ray: No acute cardiopulmonary disease.  05-14-17: TEE: Left ventricle: Poor acoustic windows limit study, even with Definity use. LVEF is approximately 50% This is imporoved from  report of 2016. The cavity size was normal. Wall thickness was increased in a pattern of mild LVH.  05-20-17: right foot x-ray: No acute bony abnormality. Diffuse soft tissue swelling. Abnormal appearance of the first MTP joint, IP joint of the toe and tarsometatarsal joints highly suggestive of gouty arthropathy.    LABS REVIEWED:   TODAY:   05-11-17: wbc 18.8; hgb 9.2; hct 27.5; mcv 80.9; plt 238; glucose 143; bun 94; creat 6.24; k+ 3.8; na++ 138; ca  9.3; liver normal albumin 2.8 05-12-17: wbc 19.4; hgb 8.1; hct 24.8; mcv 80.5; plt 253; glucose 147; bun 99; creat 6.23; k+ 3.6; na++ 137; ca 9.0; pre-albumin 8.4; iron 23; tibc 148; ferritin 771; CRP 27.9 05-13-17: glucose 139; bun 109; creat 6.59; k+ 3.7; na++ 138; ca 8.7; mag 2.3; phos 5.4; PTH; 261; albumin 2.3 05-17-17: wbc 17.7; hgb 7.8; hct 23.9; mcv 80.7; plt 334; glucose 153; bun 57; creat 4.77; k+ 4.3; na++ 137; ca 9.3  05-24-17: wbc 16.8; hgb 8.4; hct 25.3; mcv 81.6 plt 259; glucose 105; bun 91; creat 9.29; k+ 4.6; na++ 134; ca 9.8; phos 8.2; albumin 2.5; uric acid 11.0    Review of Systems  Constitutional: Negative for malaise/fatigue.  Respiratory: Negative for cough and shortness of breath.   Cardiovascular: Negative for chest pain, palpitations and leg swelling.  Gastrointestinal: Negative for abdominal pain, constipation and heartburn.  Musculoskeletal: Positive for myalgias. Negative for back pain and joint pain.       Has right foot pain and tenderness  Skin: Negative.        Has left lower extremity venous stasis ulceration  Neurological: Negative for dizziness.  Psychiatric/Behavioral: The patient is not nervous/anxious.    Physical Exam  Constitutional: He is oriented to person, place, and time. No distress.  Morbid obesity   Eyes: Conjunctivae are normal.  Neck: Neck supple. No JVD present. No thyromegaly present.  Cardiovascular: Normal rate, regular rhythm and intact distal pulses.   Respiratory: Effort normal and breath sounds normal. No respiratory distress. He has no wheezes.  GI: Soft. Bowel sounds are normal. He exhibits no distension. There is no tenderness.  Musculoskeletal: He exhibits edema.  Able to move all extremities  Has 2+ right lower extremity edema  Right foot is very tender to touch   Lymphadenopathy:    He has no cervical adenopathy.  Neurological: He is alert and oriented to person, place, and time.  Skin: Skin is warm and dry. He is not  diaphoretic.  Left lower extremity venous stasis ulceration will treat with xeroform Stage II left buttock ulceration  Bilateral lower extremities discolored    Psychiatric: He has a normal mood and affect.    ASSESSMENT/ PLAN:  TODAY:   1. Acute on chronic combined systolic and diastolic heart failure: EF 50% (05-14-17) is stable will not make changes is on hemodialysis to control fluid volume  2.  Hypertension: stable b/p 142/82; is currently not on medications;  will continue asa 81 mg daily  3. PVD: is without change in status: lower legs discolored has left lower extremity venous ulceration will continue wound care as directed and will monitor his status.   4. Anemia of chronic anemia: is stable hgb 8.4  5.  Lower extremity edema: is stable: will continue to monitor his status; is on hemodialysis  6. Acute gout in right foot: uric acid 11.0; stable:  will continue prednisone taper to 5 mg daily has percocet 5/325 mg every 6 hours as needed and will monitor his status.  7. RA: is stable; is on long term prednisone 5 mg daily   8. Constipation: is stable will continue miralax twice daily  and senna daily   9.  RLS: is stable will continue requip 0.5 mg daily   10. Morbid obesity: no change; current weight is 296 pounds.   11. ESRD: is stable is new to dialysis; will continue treatment three days per week; is followed by nephrology      MD is aware of resident's narcotic use and is in agreement with current plan of care. We will attempt to wean resident as apropriate   Ok Edwards NP [facility]  Contact [phone] Monday through Friday 8am- 5pm  After hours call [phone]

## 2017-05-27 ENCOUNTER — Non-Acute Institutional Stay (SKILLED_NURSING_FACILITY): Payer: Medicare Other | Admitting: Internal Medicine

## 2017-05-27 ENCOUNTER — Encounter: Payer: Self-pay | Admitting: Internal Medicine

## 2017-05-27 ENCOUNTER — Other Ambulatory Visit: Payer: Self-pay

## 2017-05-27 DIAGNOSIS — I5042 Chronic combined systolic (congestive) and diastolic (congestive) heart failure: Secondary | ICD-10-CM

## 2017-05-27 DIAGNOSIS — R2689 Other abnormalities of gait and mobility: Secondary | ICD-10-CM | POA: Diagnosis not present

## 2017-05-27 DIAGNOSIS — R739 Hyperglycemia, unspecified: Secondary | ICD-10-CM | POA: Diagnosis not present

## 2017-05-27 DIAGNOSIS — M05762 Rheumatoid arthritis with rheumatoid factor of left knee without organ or systems involvement: Secondary | ICD-10-CM | POA: Diagnosis not present

## 2017-05-27 DIAGNOSIS — N186 End stage renal disease: Secondary | ICD-10-CM | POA: Diagnosis not present

## 2017-05-27 DIAGNOSIS — M1A39X0 Chronic gout due to renal impairment, multiple sites, without tophus (tophi): Secondary | ICD-10-CM | POA: Diagnosis not present

## 2017-05-27 DIAGNOSIS — Z992 Dependence on renal dialysis: Secondary | ICD-10-CM

## 2017-05-27 DIAGNOSIS — D638 Anemia in other chronic diseases classified elsewhere: Secondary | ICD-10-CM | POA: Diagnosis not present

## 2017-05-27 DIAGNOSIS — M05761 Rheumatoid arthritis with rheumatoid factor of right knee without organ or systems involvement: Secondary | ICD-10-CM

## 2017-05-27 MED ORDER — OXYCODONE-ACETAMINOPHEN 10-325 MG PO TABS: ORAL_TABLET | ORAL | 0 refills | Status: DC

## 2017-05-27 NOTE — Progress Notes (Signed)
Patient ID: Noah Garner, male   DOB: 12/30/1960, 56 y.o.   MRN: [id number]     HISTORY AND PHYSICAL   DATE:   May 27, 2017  Location:    [place] Room Number: 132 B Place of Service: SNF (31)   Extended Emergency Contact Information Primary Emergency Contact: Caldwell,Timothy  [place] Phone: [phone] Relation: Relative  Advanced Directive information Does Patient Have a Medical Advance Directive?: No, Would patient like information on creating a medical advance directive?: No - Patient declined  Chief Complaint  Patient presents with  . New Admit To SNF    Admission    HPI:  56 yo male sen today as a new admission into SNF following hospital stay for left leg ulceration/cellulitis, wound infection, ESRD, leukocytosis, A/C combined HF, AOCD, ESRD, PAD. He presented to the ED with worsening LE edema and uremia. He was started on HD via left forearm AVF. He also c/o right ankle/foot pain. Right foot xray revealed no acute process but did show diffuse ST swelling and abnormal appearance of 1st MTP joint/IP joint of toe and TMT joints s/o gouty arthropathy. He was started on prednisone taper. Uric acid level 11; Hgb dropped 7.7-->8.4; WBC peaked 19.4K-->16.8K; abs neutrophils 16.9K-->13.1K; albumin 2.5; prealbumin 8.4; Cr 6.24-->9.29; CRP 27.9; BUN peaked 114; Na 134; iron 23; ferritin 771; PTH 261-->79 at d/c. He presents to SNF for short term rehab.  Today he reports severe pain in right foot uncontrolled and prevents from doing tx. He also reports numbness/tingling in both feet. No personal hx DM but CBG 159 today. He is on prednisone taper and percocet 5/325 q6hrs prn. He states he has never weighed <300 lbs.  ESRD - new start HD MWF. He has left forearm AVF. Followed by Dr Florene Glen. He is on rena-vit.  Chronic combined systolic/diastolic HF/HTN - fluid status mx by HD. EF 50%;mild LVH. He takes ASA daily  AOCD - due to ESRD. Hgb 8.4.  He gets aranesp with HD  RLS - stable on requip  OSA - has been on CPAP in past  Morbid obesity - current BMI 48.56. He has poor exercise tolerance.  RA - followed by rheumatology. Has taken chronic prednisone 5mg  daily  Past Medical History:  Diagnosis Date  . Anemia   . Arthritis   . CHF (congestive heart failure) ([place])   . Chronic kidney disease    stage 4  . Chronic systolic heart failure ([place]) 08/29/2015  . Hypertension   . Morbid obesity ([place])   . Rheumatoid arthritis ([place])   . Sleep apnea    wears CPAP    Past Surgical History:  Procedure Laterality Date  . AV FISTULA PLACEMENT Left 07/09/2016   Procedure: CREATION OF LEFT RADIOCEPHALIC ARTERIOVENOUS (AV) FISTULA;  Surgeon: Waynetta Sandy, MD;  Location: [place];  Service: Vascular;  Laterality: Left;  . CARDIAC CATHETERIZATION     12/18/11: No sign CAD, elevated left heart pressures ([facility])  . KNEE SURGERY     torn ligaments    Patient Care Team: Delilah Shan, MD as PCP - General (Family Medicine) Estanislado Emms, MD as Consulting Physician (Nephrology)  Social History   Social History  . Marital status: Single    Spouse name: N/A  . Number of children: N/A  . Years of education: N/A   Occupational History  . Not on file.   Social History Main Topics  . Smoking status: Former Smoker  Quit date: 08/12/1995  . Smokeless tobacco: Never Used  . Alcohol use No  . Drug use: No  . Sexual activity: Not on file   Other Topics Concern  . Not on file   Social History Narrative  . No narrative on file     reports that he quit smoking about 21 years ago. He has never used smokeless tobacco. He reports that he does not drink alcohol or use drugs.  Family History  Problem Relation Age of Onset  . Hypertension Mother    Family Status  Relation Status  . Father Other       Unknown History  . Mother Deceased     There is no immunization history on file for this patient.  No  Known Allergies  Medications: Patient's Medications  New Prescriptions   No medications on file  Previous Medications   ASPIRIN EC 81 MG TABLET    Take 81 mg by mouth daily.   HYDROCERIN (EUCERIN) CREA    Apply 1 application topically daily.   MULTIVITAMIN (RENA-VIT) TABS TABLET    Take 1 tablet by mouth at bedtime.   OXYCODONE-ACETAMINOPHEN (PERCOCET/ROXICET) 5-325 MG TABLET    Take 1 tablet by mouth every 6 (six) hours as needed.   POLYETHYLENE GLYCOL (MIRALAX / GLYCOLAX) PACKET    Take 17 g by mouth 2 (two) times daily.   PREDNISONE (DELTASONE) 10 MG TABLET    Take 40 mg (4 tabs) for 4 days, take 30 mg for 3 days, take 20 mg for 3 days, take 10 mg for 3 days, then resume home 5 mg dose.   PREDNISONE (DELTASONE) 5 MG TABLET    Take 5 mg by mouth daily. Start date 05-30-17   ROPINIROLE (REQUIP) 0.5 MG TABLET    Take 1 tablet (0.5 mg total) by mouth at bedtime.   SENNA (SENOKOT) 8.6 MG TABS TABLET    Take 1 tablet (8.6 mg total) by mouth at bedtime.  Modified Medications   No medications on file  Discontinued Medications   No medications on file    Review of Systems  Musculoskeletal: Positive for arthralgias, gait problem and joint swelling.  Skin: Positive for wound.  Neurological: Positive for numbness.    Vitals:   05/27/17 0952  BP: 140/80  Pulse: 76  Resp: 18  Temp: 98.2 F (36.8 C)  SpO2: 97%  Weight: (!) 348 lb 3.2 oz (157.9 kg)  Height: 5\' 11"  (1.803 m)   Body mass index is 48.56 kg/m.  Physical Exam  Constitutional: He is oriented to person, place, and time. He appears well-developed.  Sitting up in bed in NAD, morbidly obese; frail appearing  HENT:  Mouth/Throat: Oropharynx is clear and moist.  MMM; no oral thrush  Eyes: Pupils are equal, round, and reactive to light. No scleral icterus.  Neck: Neck supple. Carotid bruit is not present.  Cardiovascular: Normal rate, regular rhythm and intact distal pulses.  Exam reveals no gallop and no friction rub.     Murmur (1/6 SEM) heard. +2 pitting LE edema b/l. No calf TTP; left forearm AVF with palpable thrill and audible bruit  Pulmonary/Chest: Effort normal and breath sounds normal. He has no wheezes. He has no rales. He exhibits no tenderness.  Reduced BS b/l at base  Abdominal: Soft. Normal appearance and bowel sounds are normal. He exhibits no distension, no abdominal bruit, no pulsatile midline mass and no mass. There is no hepatomegaly. There is no tenderness. There is no rigidity, no  rebound and no guarding. No hernia.  Musculoskeletal: He exhibits edema and tenderness (right ankle and dorsal foot; right 1st MTP joint).  Lymphadenopathy:    He has no cervical adenopathy.  Neurological: He is alert and oriented to person, place, and time. He has normal reflexes.  Skin: Skin is warm and dry. Rash (chronic venous stasis changes b/l LE) noted.  dsg b/l LE intact - followed by wound care provider  Psychiatric: He has a normal mood and affect. His behavior is normal. Judgment and thought content normal.     Labs reviewed: Admission on 05/11/2017, Discharged on 05/25/2017  No results displayed because visit has over 200 results.  CBC Latest Ref Rng & Units 05/24/2017 05/21/2017 05/19/2017  WBC 4.0 - 10.5 K/uL 16.8(H) 17.3(H) 17.1(H)  Hemoglobin 13.0 - 17.0 g/dL 8.4(L) 8.0(L) 7.7(L)  Hematocrit 39.0 - 52.0 % 25.3(L) 25.7(L) 24.7(L)  Platelets 150 - 400 K/uL 259 292 307   CMP Latest Ref Rng & Units 05/24/2017 05/21/2017 05/19/2017  Glucose 65 - 99 mg/dL 105(H) 115(H) 127(H)  BUN 6 - 20 mg/dL 91(H) 66(H) 75(H)  Creatinine 0.61 - 1.24 mg/dL 9.29(H) 6.73(H) 5.89(H)  Sodium 135 - 145 mmol/L 134(L) 137 138  Potassium 3.5 - 5.1 mmol/L 4.6 4.4 4.7  Chloride 101 - 111 mmol/L 94(L) 96(L) 100(L)  CO2 22 - 32 mmol/L 25 28 29   Calcium 8.9 - 10.3 mg/dL 9.8 9.8 9.5  Total Protein 6.5 - 8.1 g/dL - 7.7 -  Total Bilirubin 0.3 - 1.2 mg/dL - 0.6 -  Alkaline Phos 38 - 126 U/L - 95 -  AST 15 - 41 U/L - 27 -  ALT 17 -  63 U/L - 33 -   Lipid Panel  No results found for: CHOL, TRIG, HDL, CHOLHDL, VLDL, LDLCALC, LDLDIRECT  No results found for: HGBA1C     Dg Chest 2 View  Result Date: 05/11/2017 CLINICAL DATA:  Right leg infection. Pain x 10 days.No chest pain or SOB.Hx of CHF. EXAM: CHEST  2 VIEW COMPARISON:  08/17/2015 FINDINGS: Borderline enlargement of the cardiac silhouette. No mediastinal or hilar masses. No convincing adenopathy. Lungs are clear.  No pleural effusion or pneumothorax. Skeletal structures are intact. IMPRESSION: No acute cardiopulmonary disease. Electronically Signed   By: Lajean Manes M.D.   On: 05/11/2017 20:13   Dg Foot 2 Views Right  Result Date: 05/20/2017 CLINICAL DATA:  Diffuse right foot pain.  Right foot swelling. EXAM: RIGHT FOOT - 2 VIEW COMPARISON:  None. FINDINGS: No acute bony or joint abnormality is identified. There is joint space narrowing and marginal erosions with overhanging edges at the IP joint of the great toe and first MTP joint. Joint space narrowing and erosions are also present about the tarsometatarsal joints, worst at the second and third tarsometatarsal joints. No fracture or dislocation. Soft tissue swelling is present. IMPRESSION: No acute bony abnormality. Diffuse soft tissue swelling. Abnormal appearance of the first MTP joint, IP joint of the toe and tarsometatarsal joints highly suggestive of gouty arthropathy. Electronically Signed   By: Inge Rise M.D.   On: 05/20/2017 15:54     Assessment/Plan   ICD-10-CM   1. Chronic gout due to renal impairment of multiple sites without tophus - pain uncontrolled M1A.39X0   2. Hyperglycemia R73.9   3. ESRD on hemodialysis (HCC) N18.6    Z99.2   4. Rheumatoid arthritis involving both knees with positive rheumatoid factor (HCC) M05.761    M05.762   5. Chronic combined systolic and  diastolic heart failure (HCC) I50.42   6. Anemia of chronic disease D63.8    START allopurinol 100mg  po daily for  gout  Change indication for prednisone to RA. Once taper complete he will take 5mg  daily  Check lipid panel and A1c  Change percocet to 10/325 q6hr prn mod - sev pain x 7 days then reassess pain level. Discussed with physiatry Dr Glori Luis  HD diet to include 1200cc per day fluid restriction  PT/OT/ST as ordered  Wound care as ordered  HD as scheduled MWF. He sees Dr Florene Glen  F/u with specialists as scheduled  GOAL: short term rehab and d/c home when medically appropriate. Communicated with pt and nursing.  Will follow  Vann Okerlund S. Perlie Gold  [facility] [address] [place], [place] [address] [phone] Cell (Monday-Friday 8 AM - 5 PM) [phone] After 5 PM and follow prompts

## 2017-05-27 NOTE — Telephone Encounter (Signed)
RX faxed to [facility] @ [phone], phone number [phone] 

## 2017-05-28 DIAGNOSIS — N186 End stage renal disease: Secondary | ICD-10-CM | POA: Diagnosis not present

## 2017-05-28 DIAGNOSIS — N2581 Secondary hyperparathyroidism of renal origin: Secondary | ICD-10-CM | POA: Diagnosis not present

## 2017-05-28 DIAGNOSIS — D631 Anemia in chronic kidney disease: Secondary | ICD-10-CM | POA: Diagnosis not present

## 2017-05-28 DIAGNOSIS — Z992 Dependence on renal dialysis: Secondary | ICD-10-CM | POA: Diagnosis not present

## 2017-05-28 DIAGNOSIS — D509 Iron deficiency anemia, unspecified: Secondary | ICD-10-CM | POA: Diagnosis not present

## 2017-05-28 LAB — HEMOGLOBIN A1C: Hemoglobin A1C: 6.1

## 2017-05-28 LAB — LIPID PANEL
CHOLESTEROL: 156 (ref 0–200)
HDL: 67 (ref 35–70)
LDL CALC: 73
Triglycerides: 80 (ref 40–160)

## 2017-05-30 DIAGNOSIS — N186 End stage renal disease: Secondary | ICD-10-CM | POA: Insufficient documentation

## 2017-05-30 DIAGNOSIS — M1A39X0 Chronic gout due to renal impairment, multiple sites, without tophus (tophi): Secondary | ICD-10-CM | POA: Insufficient documentation

## 2017-05-30 DIAGNOSIS — Z992 Dependence on renal dialysis: Secondary | ICD-10-CM

## 2017-05-31 DIAGNOSIS — Z992 Dependence on renal dialysis: Secondary | ICD-10-CM | POA: Diagnosis not present

## 2017-05-31 DIAGNOSIS — N186 End stage renal disease: Secondary | ICD-10-CM | POA: Diagnosis not present

## 2017-05-31 DIAGNOSIS — D631 Anemia in chronic kidney disease: Secondary | ICD-10-CM | POA: Diagnosis not present

## 2017-05-31 DIAGNOSIS — D509 Iron deficiency anemia, unspecified: Secondary | ICD-10-CM | POA: Diagnosis not present

## 2017-05-31 DIAGNOSIS — N2581 Secondary hyperparathyroidism of renal origin: Secondary | ICD-10-CM | POA: Diagnosis not present

## 2017-06-01 DIAGNOSIS — I83022 Varicose veins of left lower extremity with ulcer of calf: Secondary | ICD-10-CM | POA: Diagnosis not present

## 2017-06-02 DIAGNOSIS — Z992 Dependence on renal dialysis: Secondary | ICD-10-CM | POA: Diagnosis not present

## 2017-06-02 DIAGNOSIS — D631 Anemia in chronic kidney disease: Secondary | ICD-10-CM | POA: Diagnosis not present

## 2017-06-02 DIAGNOSIS — D509 Iron deficiency anemia, unspecified: Secondary | ICD-10-CM | POA: Diagnosis not present

## 2017-06-02 DIAGNOSIS — N2581 Secondary hyperparathyroidism of renal origin: Secondary | ICD-10-CM | POA: Diagnosis not present

## 2017-06-02 DIAGNOSIS — N186 End stage renal disease: Secondary | ICD-10-CM | POA: Diagnosis not present

## 2017-06-04 DIAGNOSIS — N186 End stage renal disease: Secondary | ICD-10-CM | POA: Diagnosis not present

## 2017-06-04 DIAGNOSIS — Z992 Dependence on renal dialysis: Secondary | ICD-10-CM | POA: Diagnosis not present

## 2017-06-04 DIAGNOSIS — D509 Iron deficiency anemia, unspecified: Secondary | ICD-10-CM | POA: Diagnosis not present

## 2017-06-04 DIAGNOSIS — N2581 Secondary hyperparathyroidism of renal origin: Secondary | ICD-10-CM | POA: Diagnosis not present

## 2017-06-04 DIAGNOSIS — D631 Anemia in chronic kidney disease: Secondary | ICD-10-CM | POA: Diagnosis not present

## 2017-06-07 DIAGNOSIS — N186 End stage renal disease: Secondary | ICD-10-CM | POA: Diagnosis not present

## 2017-06-07 DIAGNOSIS — D509 Iron deficiency anemia, unspecified: Secondary | ICD-10-CM | POA: Diagnosis not present

## 2017-06-07 DIAGNOSIS — Z992 Dependence on renal dialysis: Secondary | ICD-10-CM | POA: Diagnosis not present

## 2017-06-07 DIAGNOSIS — D631 Anemia in chronic kidney disease: Secondary | ICD-10-CM | POA: Diagnosis not present

## 2017-06-07 DIAGNOSIS — N2581 Secondary hyperparathyroidism of renal origin: Secondary | ICD-10-CM | POA: Diagnosis not present

## 2017-06-08 DIAGNOSIS — I83022 Varicose veins of left lower extremity with ulcer of calf: Secondary | ICD-10-CM | POA: Diagnosis not present

## 2017-06-08 DIAGNOSIS — R2689 Other abnormalities of gait and mobility: Secondary | ICD-10-CM | POA: Diagnosis not present

## 2017-06-09 DIAGNOSIS — D509 Iron deficiency anemia, unspecified: Secondary | ICD-10-CM | POA: Diagnosis not present

## 2017-06-09 DIAGNOSIS — N186 End stage renal disease: Secondary | ICD-10-CM | POA: Diagnosis not present

## 2017-06-09 DIAGNOSIS — Z992 Dependence on renal dialysis: Secondary | ICD-10-CM | POA: Diagnosis not present

## 2017-06-09 DIAGNOSIS — N2581 Secondary hyperparathyroidism of renal origin: Secondary | ICD-10-CM | POA: Diagnosis not present

## 2017-06-09 DIAGNOSIS — D631 Anemia in chronic kidney disease: Secondary | ICD-10-CM | POA: Diagnosis not present

## 2017-06-10 ENCOUNTER — Encounter: Payer: Self-pay | Admitting: Adult Health

## 2017-06-10 NOTE — Progress Notes (Signed)
Entered in error

## 2017-06-11 DIAGNOSIS — N186 End stage renal disease: Secondary | ICD-10-CM | POA: Diagnosis not present

## 2017-06-11 DIAGNOSIS — Z992 Dependence on renal dialysis: Secondary | ICD-10-CM | POA: Diagnosis not present

## 2017-06-11 DIAGNOSIS — D509 Iron deficiency anemia, unspecified: Secondary | ICD-10-CM | POA: Diagnosis not present

## 2017-06-11 DIAGNOSIS — D631 Anemia in chronic kidney disease: Secondary | ICD-10-CM | POA: Diagnosis not present

## 2017-06-11 DIAGNOSIS — N2581 Secondary hyperparathyroidism of renal origin: Secondary | ICD-10-CM | POA: Diagnosis not present

## 2017-06-13 DIAGNOSIS — Z992 Dependence on renal dialysis: Secondary | ICD-10-CM | POA: Diagnosis not present

## 2017-06-13 DIAGNOSIS — N186 End stage renal disease: Secondary | ICD-10-CM | POA: Diagnosis not present

## 2017-06-13 DIAGNOSIS — I129 Hypertensive chronic kidney disease with stage 1 through stage 4 chronic kidney disease, or unspecified chronic kidney disease: Secondary | ICD-10-CM | POA: Diagnosis not present

## 2017-06-14 ENCOUNTER — Encounter: Payer: Self-pay | Admitting: Adult Health

## 2017-06-14 ENCOUNTER — Non-Acute Institutional Stay (SKILLED_NURSING_FACILITY): Payer: Medicare Other | Admitting: Adult Health

## 2017-06-14 DIAGNOSIS — Z283 Underimmunization status: Secondary | ICD-10-CM | POA: Diagnosis not present

## 2017-06-14 DIAGNOSIS — D631 Anemia in chronic kidney disease: Secondary | ICD-10-CM | POA: Diagnosis not present

## 2017-06-14 DIAGNOSIS — N2581 Secondary hyperparathyroidism of renal origin: Secondary | ICD-10-CM | POA: Diagnosis not present

## 2017-06-14 DIAGNOSIS — D509 Iron deficiency anemia, unspecified: Secondary | ICD-10-CM | POA: Diagnosis not present

## 2017-06-14 DIAGNOSIS — Z992 Dependence on renal dialysis: Secondary | ICD-10-CM | POA: Diagnosis not present

## 2017-06-14 DIAGNOSIS — R251 Tremor, unspecified: Secondary | ICD-10-CM

## 2017-06-14 DIAGNOSIS — N186 End stage renal disease: Secondary | ICD-10-CM | POA: Diagnosis not present

## 2017-06-14 NOTE — Progress Notes (Signed)
Location:   [place] Room Number: [place] of Service:  SNF (31)   CODE STATUS: Full code  No Known Allergies  Chief Complaint  Patient presents with  . Acute Visit    Hand tremors    HPI:  He is complaining of bilateral hand tremors. He states the tremors started yesterday; they are interfering with his ability to eat. He continues with hemodialysis three days per week    Past Medical History:  Diagnosis Date  . Anemia   . Arthritis   . CHF (congestive heart failure) ([place])   . Chronic kidney disease    stage 4  . Chronic systolic heart failure ([place]) 08/29/2015  . Hypertension   . Morbid obesity ([place])   . Rheumatoid arthritis ([place])   . Sleep apnea    wears CPAP    Past Surgical History:  Procedure Laterality Date  . AV FISTULA PLACEMENT Left 07/09/2016   Procedure: CREATION OF LEFT RADIOCEPHALIC ARTERIOVENOUS (AV) FISTULA;  Surgeon: Waynetta Sandy, MD;  Location: [place];  Service: Vascular;  Laterality: Left;  . CARDIAC CATHETERIZATION     12/18/11: No sign CAD, elevated left heart pressures ([facility])  . KNEE SURGERY     torn ligaments    Social History   Social History  . Marital status: Single    Spouse name: N/A  . Number of children: N/A  . Years of education: N/A   Occupational History  . Not on file.   Social History Main Topics  . Smoking status: Former Smoker    Quit date: 08/12/1995  . Smokeless tobacco: Never Used  . Alcohol use No  . Drug use: No  . Sexual activity: Not on file   Other Topics Concern  . Not on file   Social History Narrative  . No narrative on file   Family History  Problem Relation Age of Onset  . Hypertension Mother       VITAL SIGNS BP (!) 150/98   Pulse 78   Temp (!) 97 F (36.1 C)   Resp 20   Ht 5\' 9"  (1.753 m)   Wt (!) 348 lb 3.2 oz (157.9 kg)   SpO2 97%   BMI 51.42 kg/m   Patient's Medications  New Prescriptions   No medications on file  Previous  Medications   ALLOPURINOL (ZYLOPRIM) 100 MG TABLET    Take 100 mg by mouth daily.   ASPIRIN EC 81 MG TABLET    Take 81 mg by mouth daily.   MULTIVITAMIN (RENA-VIT) TABS TABLET    Take 1 tablet by mouth at bedtime.   POLYETHYLENE GLYCOL (MIRALAX / GLYCOLAX) PACKET    Take 17 g by mouth 2 (two) times daily.   PREDNISONE (DELTASONE) 5 MG TABLET    Take 5 mg by mouth daily. Start date 06-20-17   PREDNISONE PO    06/10/17 - 06/12/17 Give 50 mg by mouth one time a day x 2 days 06/12/17 - 06/14/17 give 40 mg by mouth one time a day x 2 days 06/14/17 - 06/16/17 Give 30 mg by mouth one time a day x 2 days 06/16/17 - 06/18/17 Give 20 mg by mouth one time a day x 2 days 06/18/17 - 06/20/17 Give 10 mg by mouth one time a day x 2 days   ROPINIROLE (REQUIP) 0.5 MG TABLET    Take 1 tablet (0.5 mg total) by mouth at bedtime.   SENNA (SENOKOT) 8.6 MG TABS TABLET  Take 1 tablet (8.6 mg total) by mouth at bedtime.   TRAMADOL (ULTRAM) 50 MG TABLET    Take by mouth 2 (two) times daily.   UNABLE TO FIND    FLUID RESTRICTION - 1200 cc daily, 720cc dietary, 480cc nurse every shift.  Document amount given from nurse only should not esceed 480cc total per day.  Modified Medications   No medications on file  Discontinued Medications   No medications on file     SIGNIFICANT DIAGNOSTIC EXAMS  PREVIOUS:  05-11-17: chest x-ray: No acute cardiopulmonary disease.  05-14-17: TEE: Left ventricle: Poor acoustic windows limit study, even with Definity use. LVEF is approximately 50% This is imporoved from  report of 2016. The cavity size was normal. Wall thickness was increased in a pattern of mild LVH.  05-20-17: right foot x-ray: No acute bony abnormality. Diffuse soft tissue swelling. Abnormal appearance of the first MTP joint, IP joint of the toe and tarsometatarsal joints highly suggestive of gouty arthropathy.  NO NEW EXAMS     LABS REVIEWED:   PREVIOUS:   05-11-17: wbc 18.8; hgb 9.2; hct 27.5; mcv 80.9; plt 238; glucose  143; bun 94; creat 6.24; k+ 3.8; na++ 138; ca 9.3; liver normal albumin 2.8 05-12-17: wbc 19.4; hgb 8.1; hct 24.8; mcv 80.5; plt 253; glucose 147; bun 99; creat 6.23; k+ 3.6; na++ 137; ca 9.0; pre-albumin 8.4; iron 23; tibc 148; ferritin 771; CRP 27.9 05-13-17: glucose 139; bun 109; creat 6.59; k+ 3.7; na++ 138; ca 8.7; mag 2.3; phos 5.4; PTH; 261; albumin 2.3 05-17-17: wbc 17.7; hgb 7.8; hct 23.9; mcv 80.7; plt 334; glucose 153; bun 57; creat 4.77; k+ 4.3; na++ 137; ca 9.3  05-24-17: wbc 16.8; hgb 8.4; hct 25.3; mcv 81.6 plt 259; glucose 105; bun 91; creat 9.29; k+ 4.6; na++ 134; ca 9.8; phos 8.2; albumin 2.5; uric acid 11.0   NO NEW EXAMS    Review of Systems  Constitutional: Negative for malaise/fatigue.  Respiratory: Negative for cough and shortness of breath.   Cardiovascular: Negative for chest pain, palpitations and leg swelling.  Gastrointestinal: Negative for abdominal pain, constipation and heartburn.  Musculoskeletal: Negative for back pain, joint pain and myalgias.  Skin: Negative.   Neurological: Positive for tremors. Negative for dizziness.       Bilateral hands   Psychiatric/Behavioral: The patient is not nervous/anxious.    Physical Exam  Constitutional: He is oriented to person, place, and time. He appears well-developed and well-nourished. No distress.  Morbid obesity   Neck: Neck supple. No thyromegaly present.  Abdominal: Soft. Bowel sounds are normal. He exhibits no distension. There is no tenderness.  Musculoskeletal: He exhibits edema.  Able to move all extremities  2+ lower extremity   Lymphadenopathy:    He has no cervical adenopathy.  Neurological: He is alert and oriented to person, place, and time. He displays normal reflexes. He exhibits normal muscle tone.  Has bilateral hand tremors   Skin: Skin is warm and dry. He is not diaphoretic.  Bilateral lower extremities discolored   Psychiatric: He has a normal mood and affect.   ASSESSMENT/ PLAN:  TODAY:    1. ESRD: is stable is new to dialysis; will continue treatment three days per week; is followed by nephrology  More than likely the tremors are related to his hemodialysis; will check cmp and mag levels and will monitor   MD is aware of resident's narcotic use and is in agreement with current plan of care. We will attempt  to wean resident as apropriate     Ok Edwards NP [facility]  Contact [phone] Monday through Friday 8am- 5pm  After hours call [phone]

## 2017-06-16 DIAGNOSIS — Z992 Dependence on renal dialysis: Secondary | ICD-10-CM | POA: Diagnosis not present

## 2017-06-16 DIAGNOSIS — D631 Anemia in chronic kidney disease: Secondary | ICD-10-CM | POA: Diagnosis not present

## 2017-06-16 DIAGNOSIS — N2581 Secondary hyperparathyroidism of renal origin: Secondary | ICD-10-CM | POA: Diagnosis not present

## 2017-06-16 DIAGNOSIS — N186 End stage renal disease: Secondary | ICD-10-CM | POA: Diagnosis not present

## 2017-06-16 DIAGNOSIS — D509 Iron deficiency anemia, unspecified: Secondary | ICD-10-CM | POA: Diagnosis not present

## 2017-06-16 DIAGNOSIS — Z283 Underimmunization status: Secondary | ICD-10-CM | POA: Diagnosis not present

## 2017-06-18 ENCOUNTER — Encounter: Payer: Self-pay | Admitting: Adult Health

## 2017-06-18 DIAGNOSIS — N186 End stage renal disease: Secondary | ICD-10-CM | POA: Diagnosis not present

## 2017-06-18 DIAGNOSIS — D631 Anemia in chronic kidney disease: Secondary | ICD-10-CM | POA: Diagnosis not present

## 2017-06-18 DIAGNOSIS — D509 Iron deficiency anemia, unspecified: Secondary | ICD-10-CM | POA: Diagnosis not present

## 2017-06-18 DIAGNOSIS — N2581 Secondary hyperparathyroidism of renal origin: Secondary | ICD-10-CM | POA: Diagnosis not present

## 2017-06-18 DIAGNOSIS — Z992 Dependence on renal dialysis: Secondary | ICD-10-CM | POA: Diagnosis not present

## 2017-06-18 DIAGNOSIS — Z283 Underimmunization status: Secondary | ICD-10-CM | POA: Diagnosis not present

## 2017-06-18 NOTE — Progress Notes (Signed)
Entered in error

## 2017-06-21 DIAGNOSIS — D631 Anemia in chronic kidney disease: Secondary | ICD-10-CM | POA: Diagnosis not present

## 2017-06-21 DIAGNOSIS — N2581 Secondary hyperparathyroidism of renal origin: Secondary | ICD-10-CM | POA: Diagnosis not present

## 2017-06-21 DIAGNOSIS — N186 End stage renal disease: Secondary | ICD-10-CM | POA: Diagnosis not present

## 2017-06-21 DIAGNOSIS — Z283 Underimmunization status: Secondary | ICD-10-CM | POA: Diagnosis not present

## 2017-06-21 DIAGNOSIS — Z992 Dependence on renal dialysis: Secondary | ICD-10-CM | POA: Diagnosis not present

## 2017-06-21 DIAGNOSIS — D509 Iron deficiency anemia, unspecified: Secondary | ICD-10-CM | POA: Diagnosis not present

## 2017-06-22 ENCOUNTER — Non-Acute Institutional Stay (SKILLED_NURSING_FACILITY): Payer: Medicare Other | Admitting: Adult Health

## 2017-06-22 ENCOUNTER — Encounter: Payer: Self-pay | Admitting: Adult Health

## 2017-06-22 DIAGNOSIS — S8012XA Contusion of left lower leg, initial encounter: Secondary | ICD-10-CM

## 2017-06-22 DIAGNOSIS — R2689 Other abnormalities of gait and mobility: Secondary | ICD-10-CM | POA: Diagnosis not present

## 2017-06-22 NOTE — Progress Notes (Signed)
Location:   [place] Room Number: [place] of Service:  SNF (31)   CODE STATUS: Full Code  No Known Allergies  Chief Complaint  Patient presents with  . Acute Visit    Left leg nodule    HPI:  He has a left lower leg nodule on the posterior leg. It is slightly warm to touch; there are no other signs of infection present. He denies any pain to the area. There are no reports of fevers present. There are no signs of open area present.   Past Medical History:  Diagnosis Date  . Anemia   . Arthritis   . CHF (congestive heart failure) ([place])   . Chronic kidney disease    stage 4  . Chronic systolic heart failure ([place]) 08/29/2015  . Hypertension   . Morbid obesity ([place])   . Rheumatoid arthritis ([place])   . Sleep apnea    wears CPAP    Past Surgical History:  Procedure Laterality Date  . AV FISTULA PLACEMENT Left 07/09/2016   Procedure: CREATION OF LEFT RADIOCEPHALIC ARTERIOVENOUS (AV) FISTULA;  Surgeon: Waynetta Sandy, MD;  Location: [place];  Service: Vascular;  Laterality: Left;  . CARDIAC CATHETERIZATION     12/18/11: No sign CAD, elevated left heart pressures ([facility])  . KNEE SURGERY     torn ligaments    Social History   Social History  . Marital status: Single    Spouse name: N/A  . Number of children: N/A  . Years of education: N/A   Occupational History  . Not on file.   Social History Main Topics  . Smoking status: Former Smoker    Quit date: 08/12/1995  . Smokeless tobacco: Never Used  . Alcohol use No  . Drug use: No  . Sexual activity: Not on file   Other Topics Concern  . Not on file   Social History Narrative  . No narrative on file   Family History  Problem Relation Age of Onset  . Hypertension Mother       VITAL SIGNS BP 131/66   Pulse 95   Temp 98.5 F (36.9 C)   Resp 18   Ht 5\' 9"  (1.753 m)   Wt (!) 348 lb 3 oz (157.9 kg)   SpO2 94%   BMI 51.42 kg/m   Patient's Medications  New  Prescriptions   No medications on file  Previous Medications   ALLOPURINOL (ZYLOPRIM) 100 MG TABLET    Take 100 mg by mouth daily.   ASPIRIN EC 81 MG TABLET    Take 81 mg by mouth daily.   MULTIVITAMIN (RENA-VIT) TABS TABLET    Take 1 tablet by mouth at bedtime.   POLYETHYLENE GLYCOL (MIRALAX / GLYCOLAX) PACKET    Take 17 g by mouth 2 (two) times daily.   PREDNISONE PO    06/18/17 - 06/20/17 Give 10 mg by mouth one time a day x 2 days  06/20/17 Give 5 mg one time a day   ROPINIROLE (REQUIP) 0.5 MG TABLET    Take 1 tablet (0.5 mg total) by mouth at bedtime.   SENNA (SENOKOT) 8.6 MG TABS TABLET    Take 1 tablet (8.6 mg total) by mouth at bedtime.   TRAMADOL (ULTRAM) 50 MG TABLET    Take by mouth 2 (two) times daily.   UNABLE TO FIND    FLUID RESTRICTION - 1200 cc daily, 720cc dietary, 480cc nurse every shift.  Document amount given  from nurse only should not esceed 480cc total per day.  Modified Medications   No medications on file  Discontinued Medications   No medications on file     SIGNIFICANT DIAGNOSTIC EXAMS   PREVIOUS:  05-11-17: chest x-ray: No acute cardiopulmonary disease.  05-14-17: TEE: Left ventricle: Poor acoustic windows limit study, even with Definity use. LVEF is approximately 50% This is imporoved from  report of 2016. The cavity size was normal. Wall thickness was increased in a pattern of mild LVH.  05-20-17: right foot x-ray: No acute bony abnormality. Diffuse soft tissue swelling. Abnormal appearance of the first MTP joint, IP joint of the toe and tarsometatarsal joints highly suggestive of gouty arthropathy.  NO NEW EXAMS     LABS REVIEWED:   PREVIOUS:   05-11-17: wbc 18.8; hgb 9.2; hct 27.5; mcv 80.9; plt 238; glucose 143; bun 94; creat 6.24; k+ 3.8; na++ 138; ca 9.3; liver normal albumin 2.8 05-12-17: wbc 19.4; hgb 8.1; hct 24.8; mcv 80.5; plt 253; glucose 147; bun 99; creat 6.23; k+ 3.6; na++ 137; ca 9.0; pre-albumin 8.4; iron 23; tibc 148; ferritin 771; CRP  27.9 05-13-17: glucose 139; bun 109; creat 6.59; k+ 3.7; na++ 138; ca 8.7; mag 2.3; phos 5.4; PTH; 261; albumin 2.3 05-17-17: wbc 17.7; hgb 7.8; hct 23.9; mcv 80.7; plt 334; glucose 153; bun 57; creat 4.77; k+ 4.3; na++ 137; ca 9.3  05-24-17: wbc 16.8; hgb 8.4; hct 25.3; mcv 81.6 plt 259; glucose 105; bun 91; creat 9.29; k+ 4.6; na++ 134; ca 9.8; phos 8.2; albumin 2.5; uric acid 11.0   NO NEW EXAMS   Review of Systems  Constitutional: Negative for malaise/fatigue.  Respiratory: Negative for cough and shortness of breath.   Cardiovascular: Negative for chest pain, palpitations and leg swelling.  Gastrointestinal: Negative for abdominal pain, constipation and heartburn.  Musculoskeletal: Negative for back pain, joint pain and myalgias.  Skin: Negative.        Left posterior calf with nodule present and slightly warm to touch no pain present.   Neurological: Negative for dizziness.  Psychiatric/Behavioral: The patient is not nervous/anxious.    Physical Exam  Constitutional: He appears well-developed and well-nourished. No distress.  Morbid obesity   Neck: Neck supple. No thyromegaly present.  Cardiovascular: Normal rate, regular rhythm, normal heart sounds and intact distal pulses.   Pulmonary/Chest: Effort normal and breath sounds normal. No respiratory distress.  Abdominal: Soft. Bowel sounds are normal. He exhibits no distension.  Musculoskeletal: He exhibits edema.  2+ lower extremity edema Is able to move all extremities   Lymphadenopathy:    He has no cervical adenopathy.  Neurological: He is alert.  Skin: Skin is warm and dry. He is not diaphoretic.  Has left posterior calf nodule present; semi hard; warm to touch; no pain present no open areas; no drainage present  Bilateral lower extremities discolored   Psychiatric: He has a normal mood and affect.       ASSESSMENT/ PLAN:  TODAY;   1. Left lower posterior calf nodule: more than likely does represent a hematoma; will get  an ultrasound of nodule.   MD is aware of resident's narcotic use and is in agreement with current plan of care. We will attempt to wean resident as apropriate   Ok Edwards NP [facility]  Contact [phone] Monday through Friday 8am- 5pm  After hours call [phone]

## 2017-06-23 DIAGNOSIS — D631 Anemia in chronic kidney disease: Secondary | ICD-10-CM | POA: Diagnosis not present

## 2017-06-23 DIAGNOSIS — Z992 Dependence on renal dialysis: Secondary | ICD-10-CM | POA: Diagnosis not present

## 2017-06-23 DIAGNOSIS — N2581 Secondary hyperparathyroidism of renal origin: Secondary | ICD-10-CM | POA: Diagnosis not present

## 2017-06-23 DIAGNOSIS — D509 Iron deficiency anemia, unspecified: Secondary | ICD-10-CM | POA: Diagnosis not present

## 2017-06-23 DIAGNOSIS — Z283 Underimmunization status: Secondary | ICD-10-CM | POA: Diagnosis not present

## 2017-06-23 DIAGNOSIS — N186 End stage renal disease: Secondary | ICD-10-CM | POA: Diagnosis not present

## 2017-06-24 ENCOUNTER — Non-Acute Institutional Stay (SKILLED_NURSING_FACILITY): Payer: Medicare Other | Admitting: Adult Health

## 2017-06-24 ENCOUNTER — Encounter: Payer: Self-pay | Admitting: Adult Health

## 2017-06-24 DIAGNOSIS — I5042 Chronic combined systolic (congestive) and diastolic (congestive) heart failure: Secondary | ICD-10-CM

## 2017-06-24 DIAGNOSIS — E662 Morbid (severe) obesity with alveolar hypoventilation: Secondary | ICD-10-CM

## 2017-06-24 DIAGNOSIS — Z992 Dependence on renal dialysis: Secondary | ICD-10-CM

## 2017-06-24 DIAGNOSIS — N186 End stage renal disease: Secondary | ICD-10-CM

## 2017-06-24 DIAGNOSIS — R2689 Other abnormalities of gait and mobility: Secondary | ICD-10-CM | POA: Diagnosis not present

## 2017-06-24 NOTE — Progress Notes (Signed)
Location:   [place] Room Number: [place] of Service:  SNF (31)   CODE STATUS: Full Code  No Known Allergies  Chief Complaint  Patient presents with  . Acute Visit    Care Plan Meeting    HPI:  We have come together for his care plan meeting. We did discuss his overall health status; his plans for discharge; his needs post discharge. We have reviewed his care plan. He did not voice any nursing concerns. He states that he is pleased with his care.   Past Medical History:  Diagnosis Date  . Anemia   . Arthritis   . CHF (congestive heart failure) ([place])   . Chronic kidney disease    stage 4  . Chronic systolic heart failure ([place]) 08/29/2015  . Hypertension   . Morbid obesity ([place])   . Rheumatoid arthritis ([place])   . Sleep apnea    wears CPAP    Past Surgical History:  Procedure Laterality Date  . AV FISTULA PLACEMENT Left 07/09/2016   Procedure: CREATION OF LEFT RADIOCEPHALIC ARTERIOVENOUS (AV) FISTULA;  Surgeon: Waynetta Sandy, MD;  Location: [place];  Service: Vascular;  Laterality: Left;  . CARDIAC CATHETERIZATION     12/18/11: No sign CAD, elevated left heart pressures ([facility])  . KNEE SURGERY     torn ligaments    Social History   Social History  . Marital status: Single    Spouse name: N/A  . Number of children: N/A  . Years of education: N/A   Occupational History  . Not on file.   Social History Main Topics  . Smoking status: Former Smoker    Quit date: 08/12/1995  . Smokeless tobacco: Never Used  . Alcohol use No  . Drug use: No  . Sexual activity: Not on file   Other Topics Concern  . Not on file   Social History Narrative  . No narrative on file   Family History  Problem Relation Age of Onset  . Hypertension Mother       VITAL SIGNS BP 140/86   Pulse 100   Temp (!) 97.1 F (36.2 C)   Resp 18   Ht 5\' 9"  (1.753 m)   Wt (!) 344 lb (156 kg)   SpO2 94%   BMI 50.80 kg/m    Patient's Medications   New Prescriptions   No medications on file  Previous Medications   ALLOPURINOL (ZYLOPRIM) 100 MG TABLET    Take 100 mg by mouth daily.   ASPIRIN EC 81 MG TABLET    Take 81 mg by mouth daily.   FERRIC CITRATE (AURYXIA) 1 GM 210 MG(FE) TABLET    Take 420 mg by mouth 3 (three) times daily with meals.   FERRIC CITRATE (AURYXIA) 1 GM 210 MG(FE) TABLET    Take 840 mg by mouth 3 (three) times daily with meals. Starting 07/01/18   MULTIVITAMIN (RENA-VIT) TABS TABLET    Take 1 tablet by mouth at bedtime.   POLYETHYLENE GLYCOL (MIRALAX / GLYCOLAX) PACKET    Take 17 g by mouth 2 (two) times daily.   PREDNISONE (DELTASONE) 5 MG TABLET    Take 5 mg by mouth daily.    ROPINIROLE (REQUIP) 0.5 MG TABLET    Take 1 tablet (0.5 mg total) by mouth at bedtime.   SENNA (SENOKOT) 8.6 MG TABS TABLET    Take 1 tablet (8.6 mg total) by mouth at bedtime.   TRAMADOL (ULTRAM) 50 MG  TABLET    Take by mouth 2 (two) times daily.   UNABLE TO FIND    FLUID RESTRICTION - 1200 cc daily, 720cc dietary, 480cc nurse every shift.  Document amount given from nurse only should not esceed 480cc total per day.  Modified Medications   No medications on file  Discontinued Medications   No medications on file     SIGNIFICANT DIAGNOSTIC EXAMS  PREVIOUS:  05-11-17: chest x-ray: No acute cardiopulmonary disease.  05-14-17: TEE: Left ventricle: Poor acoustic windows limit study, even with Definity use. LVEF is approximately 50% This is imporoved from  report of 2016. The cavity size was normal. Wall thickness was increased in a pattern of mild LVH.  05-20-17: right foot x-ray: No acute bony abnormality. Diffuse soft tissue swelling. Abnormal appearance of the first MTP joint, IP joint of the toe and tarsometatarsal joints highly suggestive of gouty arthropathy.  NO NEW EXAMS     LABS REVIEWED:   PREVIOUS:   05-11-17: wbc 18.8; hgb 9.2; hct 27.5; mcv 80.9; plt 238; glucose 143; bun 94; creat 6.24; k+ 3.8; na++ 138; ca 9.3; liver  normal albumin 2.8 05-12-17: wbc 19.4; hgb 8.1; hct 24.8; mcv 80.5; plt 253; glucose 147; bun 99; creat 6.23; k+ 3.6; na++ 137; ca 9.0; pre-albumin 8.4; iron 23; tibc 148; ferritin 771; CRP 27.9 05-13-17: glucose 139; bun 109; creat 6.59; k+ 3.7; na++ 138; ca 8.7; mag 2.3; phos 5.4; PTH; 261; albumin 2.3 05-17-17: wbc 17.7; hgb 7.8; hct 23.9; mcv 80.7; plt 334; glucose 153; bun 57; creat 4.77; k+ 4.3; na++ 137; ca 9.3  05-24-17: wbc 16.8; hgb 8.4; hct 25.3; mcv 81.6 plt 259; glucose 105; bun 91; creat 9.29; k+ 4.6; na++ 134; ca 9.8; phos 8.2; albumin 2.5; uric acid 11.0   NO NEW EXAMS   Review of Systems  Constitutional: Negative for malaise/fatigue.  Respiratory: Negative for cough and shortness of breath.   Cardiovascular: Negative for chest pain, palpitations and leg swelling.  Gastrointestinal: Negative for abdominal pain, constipation and heartburn.  Musculoskeletal: Negative for back pain, joint pain and myalgias.  Skin: Negative.   Neurological: Negative for dizziness.  Psychiatric/Behavioral: The patient is not nervous/anxious.    Physical Exam  Constitutional: He is oriented to person, place, and time. He appears well-developed and well-nourished. No distress.  Morbid obesity   Neck: Normal range of motion. Neck supple. No thyromegaly present.  Cardiovascular: Normal rate, regular rhythm, normal heart sounds and intact distal pulses.   Pulmonary/Chest: Effort normal and breath sounds normal. No respiratory distress.  Abdominal: Soft. Bowel sounds are normal. He exhibits no distension. There is no tenderness.  Musculoskeletal: He exhibits edema.  1-2+ lower extremity edema Able to move all extremities   Lymphadenopathy:    He has no cervical adenopathy.  Neurological: He is alert and oriented to person, place, and time.  Skin: Skin is warm and dry. He is not diaphoretic.  Left calf nodule without change  Bilateral lower legs discolored A/v shunt +thrill +bruit   Psychiatric: He  has a normal mood and affect.    ASSESSMENT/ PLAN:  1. Chronic combined heart failure 2. morbid obesity  3. ESRD  At this time will continue his current plan of care His goal remains to go home He will need a wheelchair Will not make changes at this time.   Time spent with patient 40 minutes to include discussing his plan of care; overall health status; and medications. He has verbalized understanding.   MD  is aware of resident's narcotic use and is in agreement with current plan of care. We will attempt to wean resident as apropriate   Ok Edwards NP [facility]  Contact [phone] Monday through Friday 8am- 5pm  After hours call [phone]

## 2017-06-25 DIAGNOSIS — Z283 Underimmunization status: Secondary | ICD-10-CM | POA: Diagnosis not present

## 2017-06-25 DIAGNOSIS — N186 End stage renal disease: Secondary | ICD-10-CM | POA: Diagnosis not present

## 2017-06-25 DIAGNOSIS — D509 Iron deficiency anemia, unspecified: Secondary | ICD-10-CM | POA: Diagnosis not present

## 2017-06-25 DIAGNOSIS — Z992 Dependence on renal dialysis: Secondary | ICD-10-CM | POA: Diagnosis not present

## 2017-06-25 DIAGNOSIS — N2581 Secondary hyperparathyroidism of renal origin: Secondary | ICD-10-CM | POA: Diagnosis not present

## 2017-06-25 DIAGNOSIS — D631 Anemia in chronic kidney disease: Secondary | ICD-10-CM | POA: Diagnosis not present

## 2017-06-28 ENCOUNTER — Encounter: Payer: Self-pay | Admitting: Adult Health

## 2017-06-28 DIAGNOSIS — N186 End stage renal disease: Secondary | ICD-10-CM | POA: Diagnosis not present

## 2017-06-28 DIAGNOSIS — N2581 Secondary hyperparathyroidism of renal origin: Secondary | ICD-10-CM | POA: Diagnosis not present

## 2017-06-28 DIAGNOSIS — D509 Iron deficiency anemia, unspecified: Secondary | ICD-10-CM | POA: Diagnosis not present

## 2017-06-28 DIAGNOSIS — Z992 Dependence on renal dialysis: Secondary | ICD-10-CM | POA: Diagnosis not present

## 2017-06-28 DIAGNOSIS — Z283 Underimmunization status: Secondary | ICD-10-CM | POA: Diagnosis not present

## 2017-06-28 DIAGNOSIS — D631 Anemia in chronic kidney disease: Secondary | ICD-10-CM | POA: Diagnosis not present

## 2017-06-28 NOTE — Progress Notes (Signed)
Entered in error

## 2017-06-29 ENCOUNTER — Non-Acute Institutional Stay (SKILLED_NURSING_FACILITY): Payer: Medicare Other | Admitting: Adult Health

## 2017-06-29 ENCOUNTER — Encounter: Payer: Self-pay | Admitting: Adult Health

## 2017-06-29 DIAGNOSIS — R600 Localized edema: Secondary | ICD-10-CM | POA: Diagnosis not present

## 2017-06-29 DIAGNOSIS — D638 Anemia in other chronic diseases classified elsewhere: Secondary | ICD-10-CM

## 2017-06-29 DIAGNOSIS — I10 Essential (primary) hypertension: Secondary | ICD-10-CM

## 2017-06-29 DIAGNOSIS — I5042 Chronic combined systolic (congestive) and diastolic (congestive) heart failure: Secondary | ICD-10-CM

## 2017-06-29 DIAGNOSIS — R251 Tremor, unspecified: Secondary | ICD-10-CM | POA: Insufficient documentation

## 2017-06-29 DIAGNOSIS — I739 Peripheral vascular disease, unspecified: Secondary | ICD-10-CM

## 2017-06-29 NOTE — Progress Notes (Signed)
Location:   [place] Room Number: [place] of Service:  SNF (31)   CODE STATUS: Full Code  No Known Allergies  Chief Complaint  Patient presents with  . Medical Management of Chronic Issues    CHF: hypertension; pvd; anemia; lower extremity edema     HPI:  He is a 56 year old short term rehab being seen for the management of his chronic illnesses: chf; hypertension; pvd; anemia; lower extremity edema. Overall he is doing well; he continues with therapy. He denies any chest pain; shortness of breath; lower extremity pain. There are no nursing concerns at this time.    Past Medical History:  Diagnosis Date  . Anemia   . Arthritis   . CHF (congestive heart failure) ([place])   . Chronic kidney disease    stage 4  . Chronic systolic heart failure ([place]) 08/29/2015  . Hypertension   . Morbid obesity ([place])   . Rheumatoid arthritis ([place])   . Sleep apnea    wears CPAP    Past Surgical History:  Procedure Laterality Date  . AV FISTULA PLACEMENT Left 07/09/2016   Procedure: CREATION OF LEFT RADIOCEPHALIC ARTERIOVENOUS (AV) FISTULA;  Surgeon: Waynetta Sandy, MD;  Location: [place];  Service: Vascular;  Laterality: Left;  . CARDIAC CATHETERIZATION     12/18/11: No sign CAD, elevated left heart pressures ([facility])  . KNEE SURGERY     torn ligaments    Social History   Social History  . Marital status: Single    Spouse name: N/A  . Number of children: N/A  . Years of education: N/A   Occupational History  . Not on file.   Social History Main Topics  . Smoking status: Former Smoker    Quit date: 08/12/1995  . Smokeless tobacco: Never Used  . Alcohol use No  . Drug use: No  . Sexual activity: Not on file   Other Topics Concern  . Not on file   Social History Narrative  . No narrative on file   Family History  Problem Relation Age of Onset  . Hypertension Mother       VITAL SIGNS BP 108/76   Pulse (!) 109   Temp 98.8 F (37.1  C)   Resp 18   Ht 5\' 9"  (1.753 m)   Wt (!) 344 lb (156 kg)   SpO2 96%   BMI 50.80 kg/m   Patient's Medications  New Prescriptions   No medications on file  Previous Medications   ALLOPURINOL (ZYLOPRIM) 100 MG TABLET    Take 100 mg by mouth daily.   ASPIRIN EC 81 MG TABLET    Take 81 mg by mouth daily.   FERRIC CITRATE (AURYXIA) 1 GM 210 MG(FE) TABLET    Take 210 mg by mouth 3 (three) times daily with meals.    FERRIC CITRATE (AURYXIA) 1 GM 210 MG(FE) TABLET    Take 420 mg by mouth 3 (three) times daily with meals. Starting 07/01/18    MULTIVITAMIN (RENA-VIT) TABS TABLET    Take 1 tablet by mouth at bedtime.   POLYETHYLENE GLYCOL (MIRALAX / GLYCOLAX) PACKET    Take 17 g by mouth 2 (two) times daily.   PREDNISONE (DELTASONE) 5 MG TABLET    Take 5 mg by mouth daily.    ROPINIROLE (REQUIP) 0.5 MG TABLET    Take 0.5 mg by mouth at bedtime.   SENNA (SENOKOT) 8.6 MG TABS TABLET    Take 1  tablet (8.6 mg total) by mouth at bedtime.   TRAMADOL (ULTRAM) 50 MG TABLET    Take by mouth 2 (two) times daily.   UNABLE TO FIND    FLUID RESTRICTION - 1200 cc daily, 720cc dietary, 480cc nurse every shift.  Document amount given from nurse only should not esceed 480cc total per day.  Modified Medications   No medications on file  Discontinued Medications   No medications on file     SIGNIFICANT DIAGNOSTIC EXAMS PREVIOUS:  05-11-17: chest x-ray: No acute cardiopulmonary disease.  05-14-17: TEE: Left ventricle: Poor acoustic windows limit study, even with Definity use. LVEF is approximately 50% This is imporoved from  report of 2016. The cavity size was normal. Wall thickness was increased in a pattern of mild LVH.  05-20-17: right foot x-ray: No acute bony abnormality. Diffuse soft tissue swelling. Abnormal appearance of the first MTP joint, IP joint of the toe and tarsometatarsal joints highly suggestive of gouty arthropathy.  NO NEW EXAMS     LABS REVIEWED:   PREVIOUS:   05-11-17: wbc 18.8; hgb  9.2; hct 27.5; mcv 80.9; plt 238; glucose 143; bun 94; creat 6.24; k+ 3.8; na++ 138; ca 9.3; liver normal albumin 2.8 05-12-17: wbc 19.4; hgb 8.1; hct 24.8; mcv 80.5; plt 253; glucose 147; bun 99; creat 6.23; k+ 3.6; na++ 137; ca 9.0; pre-albumin 8.4; iron 23; tibc 148; ferritin 771; CRP 27.9 05-13-17: glucose 139; bun 109; creat 6.59; k+ 3.7; na++ 138; ca 8.7; mag 2.3; phos 5.4; PTH; 261; albumin 2.3 05-17-17: wbc 17.7; hgb 7.8; hct 23.9; mcv 80.7; plt 334; glucose 153; bun 57; creat 4.77; k+ 4.3; na++ 137; ca 9.3  05-24-17: wbc 16.8; hgb 8.4; hct 25.3; mcv 81.6 plt 259; glucose 105; bun 91; creat 9.29; k+ 4.6; na++ 134; ca 9.8; phos 8.2; albumin 2.5; uric acid 11.0   TODAY:  05-28-17: hgb a1c 6.1; chol 156; ldl 73; trig 80; hdl 67     Review of Systems  Constitutional: Negative for malaise/fatigue.  Respiratory: Negative for cough and shortness of breath.   Cardiovascular: Negative for chest pain, palpitations and leg swelling.  Gastrointestinal: Negative for abdominal pain, constipation and heartburn.  Musculoskeletal: Negative for back pain, joint pain and myalgias.  Skin: Negative.   Neurological: Negative for dizziness.  Psychiatric/Behavioral: The patient is not nervous/anxious.    Physical Exam  Constitutional: He is oriented to person, place, and time. He appears well-developed and well-nourished. No distress.  Morbid obesity   Eyes: Conjunctivae are normal.  Neck: Normal range of motion. Neck supple. No thyromegaly present.  Cardiovascular: Normal rate, regular rhythm, normal heart sounds and intact distal pulses.   Pulmonary/Chest: Effort normal and breath sounds normal. No respiratory distress.  Abdominal: Soft. Bowel sounds are normal. He exhibits no distension. There is no tenderness.  Musculoskeletal: He exhibits edema.  1-2+ lower extremity edema Able to move all extremities   Lymphadenopathy:    He has no cervical adenopathy.  Neurological: He is alert and oriented to  person, place, and time.  Skin: Skin is warm and dry. He is not diaphoretic.  Left calf nodule without change  Bilateral lower legs discolored A/v shunt +thrill +bruit    Psychiatric: He has a normal mood and affect.     ASSESSMENT/ PLAN:  TODAY:   1. Chronic combined systolic and diastolic heart failure: EF 50% (05-14-17) is stable will not make changes is on hemodialysis to control fluid volume  2.  Hypertension: stable b/p 108/76;  is currently not on medications; will continue asa 81 mg daily  3. PVD: is without change in status: lower legs discolored has left lower extremity venous ulceration will continue wound care as directed and will monitor his status.   4. Anemia of chronic anemia: is stable hgb 8.4  5.  Lower extremity edema: is stable: will continue to monitor his status; is on hemodialysis  PREVIOUS  6. Acute gout in right foot:last uric acid 11.0; stable:  will continue prednisone  5 mg daily has percocet 5/325 mg every 6 hours as needed and will monitor his status.  7. RA: is stable; is on long term prednisone 5 mg daily   8. Constipation: is stable will continue miralax twice daily  and senna daily   9.  RLS: is stable will continue requip 0.5 mg daily   10. Morbid obesity: no change; current weight is 296 pounds.   11. ESRD: is stable is new to dialysis; will continue treatment three days per week; is followed by nephrology       MD is aware of resident's narcotic use and is in agreement with current plan of care. We will attempt to wean resident as apropriate   Ok Edwards NP [facility]  Contact [phone] Monday through Friday 8am- 5pm  After hours call [phone]

## 2017-06-30 DIAGNOSIS — N2581 Secondary hyperparathyroidism of renal origin: Secondary | ICD-10-CM | POA: Diagnosis not present

## 2017-06-30 DIAGNOSIS — Z283 Underimmunization status: Secondary | ICD-10-CM | POA: Diagnosis not present

## 2017-06-30 DIAGNOSIS — D509 Iron deficiency anemia, unspecified: Secondary | ICD-10-CM | POA: Diagnosis not present

## 2017-06-30 DIAGNOSIS — N186 End stage renal disease: Secondary | ICD-10-CM | POA: Diagnosis not present

## 2017-06-30 DIAGNOSIS — Z992 Dependence on renal dialysis: Secondary | ICD-10-CM | POA: Diagnosis not present

## 2017-06-30 DIAGNOSIS — D631 Anemia in chronic kidney disease: Secondary | ICD-10-CM | POA: Diagnosis not present

## 2017-07-01 DIAGNOSIS — R2689 Other abnormalities of gait and mobility: Secondary | ICD-10-CM | POA: Diagnosis not present

## 2017-07-02 DIAGNOSIS — Z992 Dependence on renal dialysis: Secondary | ICD-10-CM | POA: Diagnosis not present

## 2017-07-02 DIAGNOSIS — D631 Anemia in chronic kidney disease: Secondary | ICD-10-CM | POA: Diagnosis not present

## 2017-07-02 DIAGNOSIS — N2581 Secondary hyperparathyroidism of renal origin: Secondary | ICD-10-CM | POA: Diagnosis not present

## 2017-07-02 DIAGNOSIS — N186 End stage renal disease: Secondary | ICD-10-CM | POA: Diagnosis not present

## 2017-07-02 DIAGNOSIS — D509 Iron deficiency anemia, unspecified: Secondary | ICD-10-CM | POA: Diagnosis not present

## 2017-07-02 DIAGNOSIS — Z283 Underimmunization status: Secondary | ICD-10-CM | POA: Diagnosis not present

## 2017-07-05 ENCOUNTER — Encounter: Payer: Self-pay | Admitting: Adult Health

## 2017-07-05 ENCOUNTER — Other Ambulatory Visit: Payer: Self-pay

## 2017-07-05 ENCOUNTER — Non-Acute Institutional Stay (SKILLED_NURSING_FACILITY): Payer: Medicare Other | Admitting: Adult Health

## 2017-07-05 DIAGNOSIS — D638 Anemia in other chronic diseases classified elsewhere: Secondary | ICD-10-CM

## 2017-07-05 DIAGNOSIS — Z992 Dependence on renal dialysis: Secondary | ICD-10-CM | POA: Diagnosis not present

## 2017-07-05 DIAGNOSIS — N186 End stage renal disease: Secondary | ICD-10-CM | POA: Diagnosis not present

## 2017-07-05 DIAGNOSIS — I5042 Chronic combined systolic (congestive) and diastolic (congestive) heart failure: Secondary | ICD-10-CM

## 2017-07-05 DIAGNOSIS — R600 Localized edema: Secondary | ICD-10-CM

## 2017-07-05 DIAGNOSIS — Z283 Underimmunization status: Secondary | ICD-10-CM | POA: Diagnosis not present

## 2017-07-05 DIAGNOSIS — D631 Anemia in chronic kidney disease: Secondary | ICD-10-CM | POA: Diagnosis not present

## 2017-07-05 DIAGNOSIS — D509 Iron deficiency anemia, unspecified: Secondary | ICD-10-CM | POA: Diagnosis not present

## 2017-07-05 DIAGNOSIS — N2581 Secondary hyperparathyroidism of renal origin: Secondary | ICD-10-CM | POA: Diagnosis not present

## 2017-07-05 MED ORDER — TRAMADOL HCL 50 MG PO TABS: 50.0000 mg | ORAL_TABLET | Freq: Two times a day (BID) | ORAL | 0 refills | Status: AC

## 2017-07-05 NOTE — Progress Notes (Signed)
Location:   [place] Room Number: [place] of Service:  SNF (31)    CODE STATUS: Full Code  No Known Allergies  Chief Complaint  Patient presents with  . Discharge Note    Discharging to Home    HPI:  He is being discharged to home with home health for pt/ot/rn. He will need a standard wheelchair and 3:1 commode. He will need his prescriptions written and will need to follow up with his medical provider.  He had been hospitalized for fluid overload; worsening renal function now new to dialysis and venous stasis ulceration. He was admitted to this facility for short term rehab and is now ready to be discharged to home.    Past Medical History:  Diagnosis Date  . Anemia   . Arthritis   . CHF (congestive heart failure) ([place])   . Chronic kidney disease    stage 4  . Chronic systolic heart failure ([place]) 08/29/2015  . Hypertension   . Morbid obesity ([place])   . Rheumatoid arthritis ([place])   . Sleep apnea    wears CPAP    Past Surgical History:  Procedure Laterality Date  . AV FISTULA PLACEMENT Left 07/09/2016   Procedure: CREATION OF LEFT RADIOCEPHALIC ARTERIOVENOUS (AV) FISTULA;  Surgeon: Waynetta Sandy, MD;  Location: [place];  Service: Vascular;  Laterality: Left;  . CARDIAC CATHETERIZATION     12/18/11: No sign CAD, elevated left heart pressures ([facility])  . KNEE SURGERY     torn ligaments    Social History   Social History  . Marital status: Single    Spouse name: N/A  . Number of children: N/A  . Years of education: N/A   Occupational History  . Not on file.   Social History Main Topics  . Smoking status: Former Smoker    Quit date: 08/12/1995  . Smokeless tobacco: Never Used  . Alcohol use No  . Drug use: No  . Sexual activity: Not on file   Other Topics Concern  . Not on file   Social History Narrative  . No narrative on file   Family History  Problem Relation Age of Onset  . Hypertension Mother     VITAL  SIGNS BP (!) 144/88   Pulse (!) 103   Temp 98 F (36.7 C)   Resp 20   Ht 5\' 9"  (1.753 m)   Wt (!) 344 lb (156 kg)   SpO2 96%   BMI 50.80 kg/m   Patient's Medications  New Prescriptions   No medications on file  Previous Medications   ALLOPURINOL (ZYLOPRIM) 100 MG TABLET    Take 100 mg by mouth daily.   ASPIRIN EC 81 MG TABLET    Take 81 mg by mouth daily.   FERRIC CITRATE (AURYXIA) 1 GM 210 MG(FE) TABLET    Take 420 mg by mouth. After meals Starting 07/01/18   MULTIVITAMIN (RENA-VIT) TABS TABLET    Take 1 tablet by mouth at bedtime.   POLYETHYLENE GLYCOL (MIRALAX / GLYCOLAX) PACKET    Take 17 g by mouth 2 (two) times daily.   PREDNISONE (DELTASONE) 5 MG TABLET    Take 5 mg by mouth daily.    ROPINIROLE (REQUIP) 0.5 MG TABLET    Take 0.5 mg by mouth at bedtime.   SENNA (SENOKOT) 8.6 MG TABS TABLET    Take 1 tablet (8.6 mg total) by mouth at bedtime.   TRAMADOL (ULTRAM) 50 MG TABLET  Take by mouth 2 (two) times daily.   UNABLE TO FIND    FLUID RESTRICTION - 1200 cc daily, 720cc dietary, 480cc nurse every shift.  Document amount given from nurse only should not esceed 480cc total per day.  Modified Medications   No medications on file  Discontinued Medications   No medications on file     SIGNIFICANT DIAGNOSTIC EXAMS  PREVIOUS:  05-11-17: chest x-ray: No acute cardiopulmonary disease.  05-14-17: TEE: Left ventricle: Poor acoustic windows limit study, even with Definity use. LVEF is approximately 50% This is imporoved from  report of 2016. The cavity size was normal. Wall thickness was increased in a pattern of mild LVH.  05-20-17: right foot x-ray: No acute bony abnormality. Diffuse soft tissue swelling. Abnormal appearance of the first MTP joint, IP joint of the toe and tarsometatarsal joints highly suggestive of gouty arthropathy.  NO NEW EXAMS     LABS REVIEWED:   PREVIOUS:   05-11-17: wbc 18.8; hgb 9.2; hct 27.5; mcv 80.9; plt 238; glucose 143; bun 94; creat 6.24; k+  3.8; na++ 138; ca 9.3; liver normal albumin 2.8 05-12-17: wbc 19.4; hgb 8.1; hct 24.8; mcv 80.5; plt 253; glucose 147; bun 99; creat 6.23; k+ 3.6; na++ 137; ca 9.0; pre-albumin 8.4; iron 23; tibc 148; ferritin 771; CRP 27.9 05-13-17: glucose 139; bun 109; creat 6.59; k+ 3.7; na++ 138; ca 8.7; mag 2.3; phos 5.4; PTH; 261; albumin 2.3 05-17-17: wbc 17.7; hgb 7.8; hct 23.9; mcv 80.7; plt 334; glucose 153; bun 57; creat 4.77; k+ 4.3; na++ 137; ca 9.3  05-24-17: wbc 16.8; hgb 8.4; hct 25.3; mcv 81.6 plt 259; glucose 105; bun 91; creat 9.29; k+ 4.6; na++ 134; ca 9.8; phos 8.2; albumin 2.5; uric acid 11.0   05-28-17: hgb a1c 6.1; chol 156; ldl 73; trig 80; hdl 67  NO NEW LABS    Review of Systems  Constitutional: Negative for malaise/fatigue.  Respiratory: Negative for cough and shortness of breath.   Cardiovascular: Negative for chest pain, palpitations and leg swelling.  Gastrointestinal: Negative for abdominal pain, constipation and heartburn.  Musculoskeletal: Negative for back pain, joint pain and myalgias.  Skin: Negative.   Neurological: Negative for dizziness.  Psychiatric/Behavioral: The patient is not nervous/anxious.    Physical Exam  Constitutional: He is oriented to person, place, and time. He appears well-developed and well-nourished. No distress.  Obesity   Neck: Neck supple. No thyromegaly present.  Cardiovascular: Normal rate, regular rhythm, normal heart sounds and intact distal pulses.   Pulmonary/Chest: Effort normal and breath sounds normal. No respiratory distress.  Abdominal: Soft. Bowel sounds are normal. He exhibits no distension.  Musculoskeletal: He exhibits edema.  1-2+ lower extremity edema Able to move all extremities    Lymphadenopathy:    He has no cervical adenopathy.  Neurological: He is alert and oriented to person, place, and time.  Skin: Skin is warm and dry. He is not diaphoretic.  Left calf nodule without change (hematoma)  Bilateral lower legs  discolored A/v shunt +thrill +bruit     Psychiatric: He has a normal mood and affect.   ASSESSMENT/ PLAN:  Patient is being discharged with the following home health services:  Pt/ot/rn: to evaluate and treat as indicated for gait balance strength adl training medication management   Patient is being discharged with the following durable medical equipment:  Bariatric 3:1 commode.  Bariatric standard wheelchair 24" with elevated leg rests; cushion; antitippers; brake extension; to allow patient to maintain currnet level of  independence with his adl's which cannot be achieved with a walker.   Patient has been advised to f/u with their PCP in 1-2 weeks to bring them up to date on their rehab stay.  Social services at facility was responsible for arranging this appointment.  Pt was provided with a 30 day supply of prescriptions for medications and refills must be obtained from their PCP.  For controlled substances, a more limited supply may be provided adequate until PCP appointment only.  A 30 day supply of medications have been written for his non-narcotic medications #60 ultram 50 mg tabs  More than 40 minutes spent with discharge discussing his medications; needs for discharge; equipment needed. He verbalized understanding.   Ok Edwards NP [facility]  Contact [phone] Monday through Friday 8am- 5pm  After hours call [phone]

## 2017-07-05 NOTE — Telephone Encounter (Signed)
Rx sent home with patient at discharge

## 2017-07-07 DIAGNOSIS — N186 End stage renal disease: Secondary | ICD-10-CM | POA: Diagnosis not present

## 2017-07-07 DIAGNOSIS — N2581 Secondary hyperparathyroidism of renal origin: Secondary | ICD-10-CM | POA: Diagnosis not present

## 2017-07-07 DIAGNOSIS — Z992 Dependence on renal dialysis: Secondary | ICD-10-CM | POA: Diagnosis not present

## 2017-07-07 DIAGNOSIS — D631 Anemia in chronic kidney disease: Secondary | ICD-10-CM | POA: Diagnosis not present

## 2017-07-07 DIAGNOSIS — Z283 Underimmunization status: Secondary | ICD-10-CM | POA: Diagnosis not present

## 2017-07-07 DIAGNOSIS — D509 Iron deficiency anemia, unspecified: Secondary | ICD-10-CM | POA: Diagnosis not present

## 2017-07-09 DIAGNOSIS — D631 Anemia in chronic kidney disease: Secondary | ICD-10-CM | POA: Diagnosis not present

## 2017-07-09 DIAGNOSIS — N186 End stage renal disease: Secondary | ICD-10-CM | POA: Diagnosis not present

## 2017-07-09 DIAGNOSIS — Z283 Underimmunization status: Secondary | ICD-10-CM | POA: Diagnosis not present

## 2017-07-09 DIAGNOSIS — N2581 Secondary hyperparathyroidism of renal origin: Secondary | ICD-10-CM | POA: Diagnosis not present

## 2017-07-09 DIAGNOSIS — Z992 Dependence on renal dialysis: Secondary | ICD-10-CM | POA: Diagnosis not present

## 2017-07-09 DIAGNOSIS — D509 Iron deficiency anemia, unspecified: Secondary | ICD-10-CM | POA: Diagnosis not present

## 2017-07-10 DIAGNOSIS — I5042 Chronic combined systolic (congestive) and diastolic (congestive) heart failure: Secondary | ICD-10-CM | POA: Diagnosis not present

## 2017-07-10 DIAGNOSIS — Z4800 Encounter for change or removal of nonsurgical wound dressing: Secondary | ICD-10-CM | POA: Diagnosis not present

## 2017-07-10 DIAGNOSIS — D631 Anemia in chronic kidney disease: Secondary | ICD-10-CM | POA: Diagnosis not present

## 2017-07-10 DIAGNOSIS — L97211 Non-pressure chronic ulcer of right calf limited to breakdown of skin: Secondary | ICD-10-CM | POA: Diagnosis not present

## 2017-07-10 DIAGNOSIS — Z9989 Dependence on other enabling machines and devices: Secondary | ICD-10-CM | POA: Diagnosis not present

## 2017-07-10 DIAGNOSIS — Z7982 Long term (current) use of aspirin: Secondary | ICD-10-CM | POA: Diagnosis not present

## 2017-07-10 DIAGNOSIS — F17211 Nicotine dependence, cigarettes, in remission: Secondary | ICD-10-CM | POA: Diagnosis not present

## 2017-07-10 DIAGNOSIS — I739 Peripheral vascular disease, unspecified: Secondary | ICD-10-CM | POA: Diagnosis not present

## 2017-07-10 DIAGNOSIS — Z992 Dependence on renal dialysis: Secondary | ICD-10-CM | POA: Diagnosis not present

## 2017-07-10 DIAGNOSIS — N186 End stage renal disease: Secondary | ICD-10-CM | POA: Diagnosis not present

## 2017-07-10 DIAGNOSIS — Z6841 Body Mass Index (BMI) 40.0 and over, adult: Secondary | ICD-10-CM | POA: Diagnosis not present

## 2017-07-10 DIAGNOSIS — I132 Hypertensive heart and chronic kidney disease with heart failure and with stage 5 chronic kidney disease, or end stage renal disease: Secondary | ICD-10-CM | POA: Diagnosis not present

## 2017-07-10 DIAGNOSIS — E662 Morbid (severe) obesity with alveolar hypoventilation: Secondary | ICD-10-CM | POA: Diagnosis not present

## 2017-07-10 DIAGNOSIS — I872 Venous insufficiency (chronic) (peripheral): Secondary | ICD-10-CM | POA: Diagnosis not present

## 2017-07-10 DIAGNOSIS — M1A39X0 Chronic gout due to renal impairment, multiple sites, without tophus (tophi): Secondary | ICD-10-CM | POA: Diagnosis not present

## 2017-07-12 DIAGNOSIS — N2581 Secondary hyperparathyroidism of renal origin: Secondary | ICD-10-CM | POA: Diagnosis not present

## 2017-07-12 DIAGNOSIS — Z992 Dependence on renal dialysis: Secondary | ICD-10-CM | POA: Diagnosis not present

## 2017-07-12 DIAGNOSIS — N186 End stage renal disease: Secondary | ICD-10-CM | POA: Diagnosis not present

## 2017-07-12 DIAGNOSIS — Z283 Underimmunization status: Secondary | ICD-10-CM | POA: Diagnosis not present

## 2017-07-12 DIAGNOSIS — D631 Anemia in chronic kidney disease: Secondary | ICD-10-CM | POA: Diagnosis not present

## 2017-07-12 DIAGNOSIS — D509 Iron deficiency anemia, unspecified: Secondary | ICD-10-CM | POA: Diagnosis not present

## 2017-07-13 ENCOUNTER — Telehealth: Payer: Self-pay

## 2017-07-13 NOTE — Telephone Encounter (Signed)
Patient was recently discharged from [facility] on 07/05/17. A call was received from home health agency stating that patient is refusing OT. Pt states that he knows how to take a bath. He wants someone to come in to cook and clean for him.

## 2017-07-14 DIAGNOSIS — D509 Iron deficiency anemia, unspecified: Secondary | ICD-10-CM | POA: Diagnosis not present

## 2017-07-14 DIAGNOSIS — I129 Hypertensive chronic kidney disease with stage 1 through stage 4 chronic kidney disease, or unspecified chronic kidney disease: Secondary | ICD-10-CM | POA: Diagnosis not present

## 2017-07-14 DIAGNOSIS — N186 End stage renal disease: Secondary | ICD-10-CM | POA: Diagnosis not present

## 2017-07-14 DIAGNOSIS — D631 Anemia in chronic kidney disease: Secondary | ICD-10-CM | POA: Diagnosis not present

## 2017-07-14 DIAGNOSIS — Z992 Dependence on renal dialysis: Secondary | ICD-10-CM | POA: Diagnosis not present

## 2017-07-14 DIAGNOSIS — N2581 Secondary hyperparathyroidism of renal origin: Secondary | ICD-10-CM | POA: Diagnosis not present

## 2017-07-14 DIAGNOSIS — Z283 Underimmunization status: Secondary | ICD-10-CM | POA: Diagnosis not present

## 2017-07-15 DIAGNOSIS — N186 End stage renal disease: Secondary | ICD-10-CM | POA: Diagnosis not present

## 2017-07-15 DIAGNOSIS — L97211 Non-pressure chronic ulcer of right calf limited to breakdown of skin: Secondary | ICD-10-CM | POA: Diagnosis not present

## 2017-07-15 DIAGNOSIS — I132 Hypertensive heart and chronic kidney disease with heart failure and with stage 5 chronic kidney disease, or end stage renal disease: Secondary | ICD-10-CM | POA: Diagnosis not present

## 2017-07-15 DIAGNOSIS — I872 Venous insufficiency (chronic) (peripheral): Secondary | ICD-10-CM | POA: Diagnosis not present

## 2017-07-15 DIAGNOSIS — I5042 Chronic combined systolic (congestive) and diastolic (congestive) heart failure: Secondary | ICD-10-CM | POA: Diagnosis not present

## 2017-07-15 DIAGNOSIS — N184 Chronic kidney disease, stage 4 (severe): Secondary | ICD-10-CM | POA: Diagnosis not present

## 2017-07-15 DIAGNOSIS — M1A39X0 Chronic gout due to renal impairment, multiple sites, without tophus (tophi): Secondary | ICD-10-CM | POA: Diagnosis not present

## 2017-07-15 DIAGNOSIS — Z992 Dependence on renal dialysis: Secondary | ICD-10-CM | POA: Diagnosis not present

## 2017-07-15 DIAGNOSIS — I739 Peripheral vascular disease, unspecified: Secondary | ICD-10-CM | POA: Diagnosis not present

## 2017-07-16 DIAGNOSIS — D509 Iron deficiency anemia, unspecified: Secondary | ICD-10-CM | POA: Diagnosis not present

## 2017-07-16 DIAGNOSIS — Z283 Underimmunization status: Secondary | ICD-10-CM | POA: Diagnosis not present

## 2017-07-16 DIAGNOSIS — Z23 Encounter for immunization: Secondary | ICD-10-CM | POA: Diagnosis not present

## 2017-07-16 DIAGNOSIS — D631 Anemia in chronic kidney disease: Secondary | ICD-10-CM | POA: Diagnosis not present

## 2017-07-16 DIAGNOSIS — N186 End stage renal disease: Secondary | ICD-10-CM | POA: Diagnosis not present

## 2017-07-16 DIAGNOSIS — N2581 Secondary hyperparathyroidism of renal origin: Secondary | ICD-10-CM | POA: Diagnosis not present

## 2017-07-17 DIAGNOSIS — I739 Peripheral vascular disease, unspecified: Secondary | ICD-10-CM | POA: Diagnosis not present

## 2017-07-17 DIAGNOSIS — L97211 Non-pressure chronic ulcer of right calf limited to breakdown of skin: Secondary | ICD-10-CM | POA: Diagnosis not present

## 2017-07-17 DIAGNOSIS — I132 Hypertensive heart and chronic kidney disease with heart failure and with stage 5 chronic kidney disease, or end stage renal disease: Secondary | ICD-10-CM | POA: Diagnosis not present

## 2017-07-17 DIAGNOSIS — I872 Venous insufficiency (chronic) (peripheral): Secondary | ICD-10-CM | POA: Diagnosis not present

## 2017-07-17 DIAGNOSIS — N186 End stage renal disease: Secondary | ICD-10-CM | POA: Diagnosis not present

## 2017-07-17 DIAGNOSIS — I5042 Chronic combined systolic (congestive) and diastolic (congestive) heart failure: Secondary | ICD-10-CM | POA: Diagnosis not present

## 2017-07-19 DIAGNOSIS — D509 Iron deficiency anemia, unspecified: Secondary | ICD-10-CM | POA: Diagnosis not present

## 2017-07-19 DIAGNOSIS — I872 Venous insufficiency (chronic) (peripheral): Secondary | ICD-10-CM | POA: Diagnosis not present

## 2017-07-19 DIAGNOSIS — I739 Peripheral vascular disease, unspecified: Secondary | ICD-10-CM | POA: Diagnosis not present

## 2017-07-19 DIAGNOSIS — I5042 Chronic combined systolic (congestive) and diastolic (congestive) heart failure: Secondary | ICD-10-CM | POA: Diagnosis not present

## 2017-07-19 DIAGNOSIS — Z283 Underimmunization status: Secondary | ICD-10-CM | POA: Diagnosis not present

## 2017-07-19 DIAGNOSIS — N186 End stage renal disease: Secondary | ICD-10-CM | POA: Diagnosis not present

## 2017-07-19 DIAGNOSIS — N2581 Secondary hyperparathyroidism of renal origin: Secondary | ICD-10-CM | POA: Diagnosis not present

## 2017-07-19 DIAGNOSIS — I132 Hypertensive heart and chronic kidney disease with heart failure and with stage 5 chronic kidney disease, or end stage renal disease: Secondary | ICD-10-CM | POA: Diagnosis not present

## 2017-07-19 DIAGNOSIS — L97211 Non-pressure chronic ulcer of right calf limited to breakdown of skin: Secondary | ICD-10-CM | POA: Diagnosis not present

## 2017-07-19 DIAGNOSIS — D631 Anemia in chronic kidney disease: Secondary | ICD-10-CM | POA: Diagnosis not present

## 2017-07-19 DIAGNOSIS — Z23 Encounter for immunization: Secondary | ICD-10-CM | POA: Diagnosis not present

## 2017-07-20 DIAGNOSIS — I132 Hypertensive heart and chronic kidney disease with heart failure and with stage 5 chronic kidney disease, or end stage renal disease: Secondary | ICD-10-CM | POA: Diagnosis not present

## 2017-07-20 DIAGNOSIS — I739 Peripheral vascular disease, unspecified: Secondary | ICD-10-CM | POA: Diagnosis not present

## 2017-07-20 DIAGNOSIS — I872 Venous insufficiency (chronic) (peripheral): Secondary | ICD-10-CM | POA: Diagnosis not present

## 2017-07-20 DIAGNOSIS — L97211 Non-pressure chronic ulcer of right calf limited to breakdown of skin: Secondary | ICD-10-CM | POA: Diagnosis not present

## 2017-07-20 DIAGNOSIS — I5042 Chronic combined systolic (congestive) and diastolic (congestive) heart failure: Secondary | ICD-10-CM | POA: Diagnosis not present

## 2017-07-20 DIAGNOSIS — N186 End stage renal disease: Secondary | ICD-10-CM | POA: Diagnosis not present

## 2017-07-21 DIAGNOSIS — Z283 Underimmunization status: Secondary | ICD-10-CM | POA: Diagnosis not present

## 2017-07-21 DIAGNOSIS — N2581 Secondary hyperparathyroidism of renal origin: Secondary | ICD-10-CM | POA: Diagnosis not present

## 2017-07-21 DIAGNOSIS — D631 Anemia in chronic kidney disease: Secondary | ICD-10-CM | POA: Diagnosis not present

## 2017-07-21 DIAGNOSIS — N186 End stage renal disease: Secondary | ICD-10-CM | POA: Diagnosis not present

## 2017-07-21 DIAGNOSIS — Z23 Encounter for immunization: Secondary | ICD-10-CM | POA: Diagnosis not present

## 2017-07-21 DIAGNOSIS — D509 Iron deficiency anemia, unspecified: Secondary | ICD-10-CM | POA: Diagnosis not present

## 2017-07-22 DIAGNOSIS — I132 Hypertensive heart and chronic kidney disease with heart failure and with stage 5 chronic kidney disease, or end stage renal disease: Secondary | ICD-10-CM | POA: Diagnosis not present

## 2017-07-22 DIAGNOSIS — I5042 Chronic combined systolic (congestive) and diastolic (congestive) heart failure: Secondary | ICD-10-CM | POA: Diagnosis not present

## 2017-07-22 DIAGNOSIS — I739 Peripheral vascular disease, unspecified: Secondary | ICD-10-CM | POA: Diagnosis not present

## 2017-07-22 DIAGNOSIS — I872 Venous insufficiency (chronic) (peripheral): Secondary | ICD-10-CM | POA: Diagnosis not present

## 2017-07-22 DIAGNOSIS — N186 End stage renal disease: Secondary | ICD-10-CM | POA: Diagnosis not present

## 2017-07-22 DIAGNOSIS — L97211 Non-pressure chronic ulcer of right calf limited to breakdown of skin: Secondary | ICD-10-CM | POA: Diagnosis not present

## 2017-07-23 DIAGNOSIS — N186 End stage renal disease: Secondary | ICD-10-CM | POA: Diagnosis not present

## 2017-07-23 DIAGNOSIS — N2581 Secondary hyperparathyroidism of renal origin: Secondary | ICD-10-CM | POA: Diagnosis not present

## 2017-07-23 DIAGNOSIS — Z23 Encounter for immunization: Secondary | ICD-10-CM | POA: Diagnosis not present

## 2017-07-23 DIAGNOSIS — D631 Anemia in chronic kidney disease: Secondary | ICD-10-CM | POA: Diagnosis not present

## 2017-07-23 DIAGNOSIS — D509 Iron deficiency anemia, unspecified: Secondary | ICD-10-CM | POA: Diagnosis not present

## 2017-07-23 DIAGNOSIS — Z283 Underimmunization status: Secondary | ICD-10-CM | POA: Diagnosis not present

## 2017-07-26 DIAGNOSIS — N2581 Secondary hyperparathyroidism of renal origin: Secondary | ICD-10-CM | POA: Diagnosis not present

## 2017-07-26 DIAGNOSIS — D631 Anemia in chronic kidney disease: Secondary | ICD-10-CM | POA: Diagnosis not present

## 2017-07-26 DIAGNOSIS — Z283 Underimmunization status: Secondary | ICD-10-CM | POA: Diagnosis not present

## 2017-07-26 DIAGNOSIS — N186 End stage renal disease: Secondary | ICD-10-CM | POA: Diagnosis not present

## 2017-07-26 DIAGNOSIS — Z23 Encounter for immunization: Secondary | ICD-10-CM | POA: Diagnosis not present

## 2017-07-26 DIAGNOSIS — D509 Iron deficiency anemia, unspecified: Secondary | ICD-10-CM | POA: Diagnosis not present

## 2017-07-27 DIAGNOSIS — I132 Hypertensive heart and chronic kidney disease with heart failure and with stage 5 chronic kidney disease, or end stage renal disease: Secondary | ICD-10-CM | POA: Diagnosis not present

## 2017-07-27 DIAGNOSIS — I739 Peripheral vascular disease, unspecified: Secondary | ICD-10-CM | POA: Diagnosis not present

## 2017-07-27 DIAGNOSIS — I5042 Chronic combined systolic (congestive) and diastolic (congestive) heart failure: Secondary | ICD-10-CM | POA: Diagnosis not present

## 2017-07-27 DIAGNOSIS — L97211 Non-pressure chronic ulcer of right calf limited to breakdown of skin: Secondary | ICD-10-CM | POA: Diagnosis not present

## 2017-07-27 DIAGNOSIS — N186 End stage renal disease: Secondary | ICD-10-CM | POA: Diagnosis not present

## 2017-07-27 DIAGNOSIS — I872 Venous insufficiency (chronic) (peripheral): Secondary | ICD-10-CM | POA: Diagnosis not present

## 2017-07-28 DIAGNOSIS — D509 Iron deficiency anemia, unspecified: Secondary | ICD-10-CM | POA: Diagnosis not present

## 2017-07-28 DIAGNOSIS — Z283 Underimmunization status: Secondary | ICD-10-CM | POA: Diagnosis not present

## 2017-07-28 DIAGNOSIS — D631 Anemia in chronic kidney disease: Secondary | ICD-10-CM | POA: Diagnosis not present

## 2017-07-28 DIAGNOSIS — N186 End stage renal disease: Secondary | ICD-10-CM | POA: Diagnosis not present

## 2017-07-28 DIAGNOSIS — N2581 Secondary hyperparathyroidism of renal origin: Secondary | ICD-10-CM | POA: Diagnosis not present

## 2017-07-28 DIAGNOSIS — Z23 Encounter for immunization: Secondary | ICD-10-CM | POA: Diagnosis not present

## 2017-07-29 DIAGNOSIS — I739 Peripheral vascular disease, unspecified: Secondary | ICD-10-CM | POA: Diagnosis not present

## 2017-07-29 DIAGNOSIS — I872 Venous insufficiency (chronic) (peripheral): Secondary | ICD-10-CM | POA: Diagnosis not present

## 2017-07-29 DIAGNOSIS — I132 Hypertensive heart and chronic kidney disease with heart failure and with stage 5 chronic kidney disease, or end stage renal disease: Secondary | ICD-10-CM | POA: Diagnosis not present

## 2017-07-29 DIAGNOSIS — L97211 Non-pressure chronic ulcer of right calf limited to breakdown of skin: Secondary | ICD-10-CM | POA: Diagnosis not present

## 2017-07-29 DIAGNOSIS — N186 End stage renal disease: Secondary | ICD-10-CM | POA: Diagnosis not present

## 2017-07-29 DIAGNOSIS — I5042 Chronic combined systolic (congestive) and diastolic (congestive) heart failure: Secondary | ICD-10-CM | POA: Diagnosis not present

## 2017-07-30 DIAGNOSIS — Z23 Encounter for immunization: Secondary | ICD-10-CM | POA: Diagnosis not present

## 2017-07-30 DIAGNOSIS — D631 Anemia in chronic kidney disease: Secondary | ICD-10-CM | POA: Diagnosis not present

## 2017-07-30 DIAGNOSIS — N2581 Secondary hyperparathyroidism of renal origin: Secondary | ICD-10-CM | POA: Diagnosis not present

## 2017-07-30 DIAGNOSIS — Z283 Underimmunization status: Secondary | ICD-10-CM | POA: Diagnosis not present

## 2017-07-30 DIAGNOSIS — D509 Iron deficiency anemia, unspecified: Secondary | ICD-10-CM | POA: Diagnosis not present

## 2017-07-30 DIAGNOSIS — N186 End stage renal disease: Secondary | ICD-10-CM | POA: Diagnosis not present

## 2017-08-01 DIAGNOSIS — Z23 Encounter for immunization: Secondary | ICD-10-CM | POA: Diagnosis not present

## 2017-08-01 DIAGNOSIS — D631 Anemia in chronic kidney disease: Secondary | ICD-10-CM | POA: Diagnosis not present

## 2017-08-01 DIAGNOSIS — Z283 Underimmunization status: Secondary | ICD-10-CM | POA: Diagnosis not present

## 2017-08-01 DIAGNOSIS — N186 End stage renal disease: Secondary | ICD-10-CM | POA: Diagnosis not present

## 2017-08-01 DIAGNOSIS — N2581 Secondary hyperparathyroidism of renal origin: Secondary | ICD-10-CM | POA: Diagnosis not present

## 2017-08-01 DIAGNOSIS — D509 Iron deficiency anemia, unspecified: Secondary | ICD-10-CM | POA: Diagnosis not present

## 2017-08-02 ENCOUNTER — Other Ambulatory Visit: Payer: Self-pay | Admitting: Adult Health

## 2017-08-02 DIAGNOSIS — N186 End stage renal disease: Secondary | ICD-10-CM | POA: Diagnosis not present

## 2017-08-02 DIAGNOSIS — I5042 Chronic combined systolic (congestive) and diastolic (congestive) heart failure: Secondary | ICD-10-CM | POA: Diagnosis not present

## 2017-08-02 DIAGNOSIS — L97211 Non-pressure chronic ulcer of right calf limited to breakdown of skin: Secondary | ICD-10-CM | POA: Diagnosis not present

## 2017-08-02 DIAGNOSIS — I739 Peripheral vascular disease, unspecified: Secondary | ICD-10-CM | POA: Diagnosis not present

## 2017-08-02 DIAGNOSIS — I872 Venous insufficiency (chronic) (peripheral): Secondary | ICD-10-CM | POA: Diagnosis not present

## 2017-08-02 DIAGNOSIS — I132 Hypertensive heart and chronic kidney disease with heart failure and with stage 5 chronic kidney disease, or end stage renal disease: Secondary | ICD-10-CM | POA: Diagnosis not present

## 2017-08-03 DIAGNOSIS — N186 End stage renal disease: Secondary | ICD-10-CM | POA: Diagnosis not present

## 2017-08-03 DIAGNOSIS — Z23 Encounter for immunization: Secondary | ICD-10-CM | POA: Diagnosis not present

## 2017-08-03 DIAGNOSIS — N2581 Secondary hyperparathyroidism of renal origin: Secondary | ICD-10-CM | POA: Diagnosis not present

## 2017-08-03 DIAGNOSIS — D509 Iron deficiency anemia, unspecified: Secondary | ICD-10-CM | POA: Diagnosis not present

## 2017-08-03 DIAGNOSIS — D631 Anemia in chronic kidney disease: Secondary | ICD-10-CM | POA: Diagnosis not present

## 2017-08-03 DIAGNOSIS — Z283 Underimmunization status: Secondary | ICD-10-CM | POA: Diagnosis not present

## 2017-08-06 DIAGNOSIS — Z283 Underimmunization status: Secondary | ICD-10-CM | POA: Diagnosis not present

## 2017-08-06 DIAGNOSIS — D509 Iron deficiency anemia, unspecified: Secondary | ICD-10-CM | POA: Diagnosis not present

## 2017-08-06 DIAGNOSIS — N186 End stage renal disease: Secondary | ICD-10-CM | POA: Diagnosis not present

## 2017-08-06 DIAGNOSIS — D631 Anemia in chronic kidney disease: Secondary | ICD-10-CM | POA: Diagnosis not present

## 2017-08-06 DIAGNOSIS — N2581 Secondary hyperparathyroidism of renal origin: Secondary | ICD-10-CM | POA: Diagnosis not present

## 2017-08-06 DIAGNOSIS — Z23 Encounter for immunization: Secondary | ICD-10-CM | POA: Diagnosis not present

## 2017-08-07 DIAGNOSIS — L97211 Non-pressure chronic ulcer of right calf limited to breakdown of skin: Secondary | ICD-10-CM | POA: Diagnosis not present

## 2017-08-07 DIAGNOSIS — I5042 Chronic combined systolic (congestive) and diastolic (congestive) heart failure: Secondary | ICD-10-CM | POA: Diagnosis not present

## 2017-08-07 DIAGNOSIS — I739 Peripheral vascular disease, unspecified: Secondary | ICD-10-CM | POA: Diagnosis not present

## 2017-08-07 DIAGNOSIS — I132 Hypertensive heart and chronic kidney disease with heart failure and with stage 5 chronic kidney disease, or end stage renal disease: Secondary | ICD-10-CM | POA: Diagnosis not present

## 2017-08-07 DIAGNOSIS — I872 Venous insufficiency (chronic) (peripheral): Secondary | ICD-10-CM | POA: Diagnosis not present

## 2017-08-07 DIAGNOSIS — N186 End stage renal disease: Secondary | ICD-10-CM | POA: Diagnosis not present

## 2017-08-09 DIAGNOSIS — D509 Iron deficiency anemia, unspecified: Secondary | ICD-10-CM | POA: Diagnosis not present

## 2017-08-09 DIAGNOSIS — Z283 Underimmunization status: Secondary | ICD-10-CM | POA: Diagnosis not present

## 2017-08-09 DIAGNOSIS — D631 Anemia in chronic kidney disease: Secondary | ICD-10-CM | POA: Diagnosis not present

## 2017-08-09 DIAGNOSIS — N2581 Secondary hyperparathyroidism of renal origin: Secondary | ICD-10-CM | POA: Diagnosis not present

## 2017-08-09 DIAGNOSIS — Z23 Encounter for immunization: Secondary | ICD-10-CM | POA: Diagnosis not present

## 2017-08-09 DIAGNOSIS — N186 End stage renal disease: Secondary | ICD-10-CM | POA: Diagnosis not present

## 2017-08-11 DIAGNOSIS — D631 Anemia in chronic kidney disease: Secondary | ICD-10-CM | POA: Diagnosis not present

## 2017-08-11 DIAGNOSIS — D509 Iron deficiency anemia, unspecified: Secondary | ICD-10-CM | POA: Diagnosis not present

## 2017-08-11 DIAGNOSIS — Z283 Underimmunization status: Secondary | ICD-10-CM | POA: Diagnosis not present

## 2017-08-11 DIAGNOSIS — I739 Peripheral vascular disease, unspecified: Secondary | ICD-10-CM | POA: Diagnosis not present

## 2017-08-11 DIAGNOSIS — N186 End stage renal disease: Secondary | ICD-10-CM | POA: Diagnosis not present

## 2017-08-11 DIAGNOSIS — Z23 Encounter for immunization: Secondary | ICD-10-CM | POA: Diagnosis not present

## 2017-08-11 DIAGNOSIS — I132 Hypertensive heart and chronic kidney disease with heart failure and with stage 5 chronic kidney disease, or end stage renal disease: Secondary | ICD-10-CM | POA: Diagnosis not present

## 2017-08-11 DIAGNOSIS — L97211 Non-pressure chronic ulcer of right calf limited to breakdown of skin: Secondary | ICD-10-CM | POA: Diagnosis not present

## 2017-08-11 DIAGNOSIS — N2581 Secondary hyperparathyroidism of renal origin: Secondary | ICD-10-CM | POA: Diagnosis not present

## 2017-08-11 DIAGNOSIS — I5042 Chronic combined systolic (congestive) and diastolic (congestive) heart failure: Secondary | ICD-10-CM | POA: Diagnosis not present

## 2017-08-11 DIAGNOSIS — I872 Venous insufficiency (chronic) (peripheral): Secondary | ICD-10-CM | POA: Diagnosis not present

## 2017-08-13 DIAGNOSIS — I132 Hypertensive heart and chronic kidney disease with heart failure and with stage 5 chronic kidney disease, or end stage renal disease: Secondary | ICD-10-CM | POA: Diagnosis not present

## 2017-08-13 DIAGNOSIS — N186 End stage renal disease: Secondary | ICD-10-CM | POA: Diagnosis not present

## 2017-08-13 DIAGNOSIS — N2581 Secondary hyperparathyroidism of renal origin: Secondary | ICD-10-CM | POA: Diagnosis not present

## 2017-08-13 DIAGNOSIS — D631 Anemia in chronic kidney disease: Secondary | ICD-10-CM | POA: Diagnosis not present

## 2017-08-13 DIAGNOSIS — I5042 Chronic combined systolic (congestive) and diastolic (congestive) heart failure: Secondary | ICD-10-CM | POA: Diagnosis not present

## 2017-08-13 DIAGNOSIS — I129 Hypertensive chronic kidney disease with stage 1 through stage 4 chronic kidney disease, or unspecified chronic kidney disease: Secondary | ICD-10-CM | POA: Diagnosis not present

## 2017-08-13 DIAGNOSIS — D509 Iron deficiency anemia, unspecified: Secondary | ICD-10-CM | POA: Diagnosis not present

## 2017-08-13 DIAGNOSIS — Z992 Dependence on renal dialysis: Secondary | ICD-10-CM | POA: Diagnosis not present

## 2017-08-13 DIAGNOSIS — Z283 Underimmunization status: Secondary | ICD-10-CM | POA: Diagnosis not present

## 2017-08-13 DIAGNOSIS — Z23 Encounter for immunization: Secondary | ICD-10-CM | POA: Diagnosis not present

## 2017-08-13 DIAGNOSIS — I872 Venous insufficiency (chronic) (peripheral): Secondary | ICD-10-CM | POA: Diagnosis not present

## 2017-08-13 DIAGNOSIS — I739 Peripheral vascular disease, unspecified: Secondary | ICD-10-CM | POA: Diagnosis not present

## 2017-08-13 DIAGNOSIS — L97211 Non-pressure chronic ulcer of right calf limited to breakdown of skin: Secondary | ICD-10-CM | POA: Diagnosis not present

## 2017-08-16 DIAGNOSIS — Z283 Underimmunization status: Secondary | ICD-10-CM | POA: Diagnosis not present

## 2017-08-16 DIAGNOSIS — N2581 Secondary hyperparathyroidism of renal origin: Secondary | ICD-10-CM | POA: Diagnosis not present

## 2017-08-16 DIAGNOSIS — D631 Anemia in chronic kidney disease: Secondary | ICD-10-CM | POA: Diagnosis not present

## 2017-08-16 DIAGNOSIS — N186 End stage renal disease: Secondary | ICD-10-CM | POA: Diagnosis not present

## 2017-08-16 DIAGNOSIS — Z23 Encounter for immunization: Secondary | ICD-10-CM | POA: Diagnosis not present

## 2017-08-18 DIAGNOSIS — Z23 Encounter for immunization: Secondary | ICD-10-CM | POA: Diagnosis not present

## 2017-08-18 DIAGNOSIS — N186 End stage renal disease: Secondary | ICD-10-CM | POA: Diagnosis not present

## 2017-08-18 DIAGNOSIS — Z283 Underimmunization status: Secondary | ICD-10-CM | POA: Diagnosis not present

## 2017-08-18 DIAGNOSIS — N2581 Secondary hyperparathyroidism of renal origin: Secondary | ICD-10-CM | POA: Diagnosis not present

## 2017-08-18 DIAGNOSIS — D631 Anemia in chronic kidney disease: Secondary | ICD-10-CM | POA: Diagnosis not present

## 2017-08-19 DIAGNOSIS — I739 Peripheral vascular disease, unspecified: Secondary | ICD-10-CM | POA: Diagnosis not present

## 2017-08-19 DIAGNOSIS — N186 End stage renal disease: Secondary | ICD-10-CM | POA: Diagnosis not present

## 2017-08-19 DIAGNOSIS — I872 Venous insufficiency (chronic) (peripheral): Secondary | ICD-10-CM | POA: Diagnosis not present

## 2017-08-19 DIAGNOSIS — I132 Hypertensive heart and chronic kidney disease with heart failure and with stage 5 chronic kidney disease, or end stage renal disease: Secondary | ICD-10-CM | POA: Diagnosis not present

## 2017-08-19 DIAGNOSIS — L97211 Non-pressure chronic ulcer of right calf limited to breakdown of skin: Secondary | ICD-10-CM | POA: Diagnosis not present

## 2017-08-19 DIAGNOSIS — I5042 Chronic combined systolic (congestive) and diastolic (congestive) heart failure: Secondary | ICD-10-CM | POA: Diagnosis not present

## 2017-08-20 DIAGNOSIS — Z23 Encounter for immunization: Secondary | ICD-10-CM | POA: Diagnosis not present

## 2017-08-20 DIAGNOSIS — D631 Anemia in chronic kidney disease: Secondary | ICD-10-CM | POA: Diagnosis not present

## 2017-08-20 DIAGNOSIS — Z283 Underimmunization status: Secondary | ICD-10-CM | POA: Diagnosis not present

## 2017-08-20 DIAGNOSIS — N2581 Secondary hyperparathyroidism of renal origin: Secondary | ICD-10-CM | POA: Diagnosis not present

## 2017-08-20 DIAGNOSIS — N186 End stage renal disease: Secondary | ICD-10-CM | POA: Diagnosis not present

## 2017-08-25 DIAGNOSIS — D631 Anemia in chronic kidney disease: Secondary | ICD-10-CM | POA: Diagnosis not present

## 2017-08-25 DIAGNOSIS — Z283 Underimmunization status: Secondary | ICD-10-CM | POA: Diagnosis not present

## 2017-08-25 DIAGNOSIS — N2581 Secondary hyperparathyroidism of renal origin: Secondary | ICD-10-CM | POA: Diagnosis not present

## 2017-08-25 DIAGNOSIS — N186 End stage renal disease: Secondary | ICD-10-CM | POA: Diagnosis not present

## 2017-08-25 DIAGNOSIS — Z23 Encounter for immunization: Secondary | ICD-10-CM | POA: Diagnosis not present

## 2017-08-26 DIAGNOSIS — I739 Peripheral vascular disease, unspecified: Secondary | ICD-10-CM | POA: Diagnosis not present

## 2017-08-26 DIAGNOSIS — I132 Hypertensive heart and chronic kidney disease with heart failure and with stage 5 chronic kidney disease, or end stage renal disease: Secondary | ICD-10-CM | POA: Diagnosis not present

## 2017-08-26 DIAGNOSIS — I872 Venous insufficiency (chronic) (peripheral): Secondary | ICD-10-CM | POA: Diagnosis not present

## 2017-08-26 DIAGNOSIS — L97211 Non-pressure chronic ulcer of right calf limited to breakdown of skin: Secondary | ICD-10-CM | POA: Diagnosis not present

## 2017-08-26 DIAGNOSIS — I5042 Chronic combined systolic (congestive) and diastolic (congestive) heart failure: Secondary | ICD-10-CM | POA: Diagnosis not present

## 2017-08-26 DIAGNOSIS — N186 End stage renal disease: Secondary | ICD-10-CM | POA: Diagnosis not present

## 2017-08-27 DIAGNOSIS — N186 End stage renal disease: Secondary | ICD-10-CM | POA: Diagnosis not present

## 2017-08-27 DIAGNOSIS — N2581 Secondary hyperparathyroidism of renal origin: Secondary | ICD-10-CM | POA: Diagnosis not present

## 2017-08-27 DIAGNOSIS — D631 Anemia in chronic kidney disease: Secondary | ICD-10-CM | POA: Diagnosis not present

## 2017-08-27 DIAGNOSIS — Z283 Underimmunization status: Secondary | ICD-10-CM | POA: Diagnosis not present

## 2017-08-27 DIAGNOSIS — Z23 Encounter for immunization: Secondary | ICD-10-CM | POA: Diagnosis not present

## 2017-08-30 DIAGNOSIS — Z23 Encounter for immunization: Secondary | ICD-10-CM | POA: Diagnosis not present

## 2017-08-30 DIAGNOSIS — N2581 Secondary hyperparathyroidism of renal origin: Secondary | ICD-10-CM | POA: Diagnosis not present

## 2017-08-30 DIAGNOSIS — N186 End stage renal disease: Secondary | ICD-10-CM | POA: Diagnosis not present

## 2017-08-30 DIAGNOSIS — Z283 Underimmunization status: Secondary | ICD-10-CM | POA: Diagnosis not present

## 2017-08-30 DIAGNOSIS — D631 Anemia in chronic kidney disease: Secondary | ICD-10-CM | POA: Diagnosis not present

## 2017-09-01 DIAGNOSIS — N2581 Secondary hyperparathyroidism of renal origin: Secondary | ICD-10-CM | POA: Diagnosis not present

## 2017-09-01 DIAGNOSIS — N186 End stage renal disease: Secondary | ICD-10-CM | POA: Diagnosis not present

## 2017-09-01 DIAGNOSIS — Z283 Underimmunization status: Secondary | ICD-10-CM | POA: Diagnosis not present

## 2017-09-01 DIAGNOSIS — Z23 Encounter for immunization: Secondary | ICD-10-CM | POA: Diagnosis not present

## 2017-09-01 DIAGNOSIS — D631 Anemia in chronic kidney disease: Secondary | ICD-10-CM | POA: Diagnosis not present

## 2017-09-03 DIAGNOSIS — D631 Anemia in chronic kidney disease: Secondary | ICD-10-CM | POA: Diagnosis not present

## 2017-09-03 DIAGNOSIS — N2581 Secondary hyperparathyroidism of renal origin: Secondary | ICD-10-CM | POA: Diagnosis not present

## 2017-09-03 DIAGNOSIS — N186 End stage renal disease: Secondary | ICD-10-CM | POA: Diagnosis not present

## 2017-09-03 DIAGNOSIS — Z23 Encounter for immunization: Secondary | ICD-10-CM | POA: Diagnosis not present

## 2017-09-03 DIAGNOSIS — Z283 Underimmunization status: Secondary | ICD-10-CM | POA: Diagnosis not present

## 2017-09-05 DIAGNOSIS — N186 End stage renal disease: Secondary | ICD-10-CM | POA: Diagnosis not present

## 2017-09-05 DIAGNOSIS — N2581 Secondary hyperparathyroidism of renal origin: Secondary | ICD-10-CM | POA: Diagnosis not present

## 2017-09-05 DIAGNOSIS — Z23 Encounter for immunization: Secondary | ICD-10-CM | POA: Diagnosis not present

## 2017-09-05 DIAGNOSIS — D631 Anemia in chronic kidney disease: Secondary | ICD-10-CM | POA: Diagnosis not present

## 2017-09-05 DIAGNOSIS — Z283 Underimmunization status: Secondary | ICD-10-CM | POA: Diagnosis not present

## 2017-09-08 DIAGNOSIS — D631 Anemia in chronic kidney disease: Secondary | ICD-10-CM | POA: Diagnosis not present

## 2017-09-08 DIAGNOSIS — N186 End stage renal disease: Secondary | ICD-10-CM | POA: Diagnosis not present

## 2017-09-08 DIAGNOSIS — Z283 Underimmunization status: Secondary | ICD-10-CM | POA: Diagnosis not present

## 2017-09-08 DIAGNOSIS — Z23 Encounter for immunization: Secondary | ICD-10-CM | POA: Diagnosis not present

## 2017-09-08 DIAGNOSIS — N2581 Secondary hyperparathyroidism of renal origin: Secondary | ICD-10-CM | POA: Diagnosis not present

## 2017-09-10 DIAGNOSIS — N2581 Secondary hyperparathyroidism of renal origin: Secondary | ICD-10-CM | POA: Diagnosis not present

## 2017-09-10 DIAGNOSIS — D631 Anemia in chronic kidney disease: Secondary | ICD-10-CM | POA: Diagnosis not present

## 2017-09-10 DIAGNOSIS — Z23 Encounter for immunization: Secondary | ICD-10-CM | POA: Diagnosis not present

## 2017-09-10 DIAGNOSIS — Z283 Underimmunization status: Secondary | ICD-10-CM | POA: Diagnosis not present

## 2017-09-10 DIAGNOSIS — N186 End stage renal disease: Secondary | ICD-10-CM | POA: Diagnosis not present

## 2017-09-12 DIAGNOSIS — Z283 Underimmunization status: Secondary | ICD-10-CM | POA: Diagnosis not present

## 2017-09-12 DIAGNOSIS — D631 Anemia in chronic kidney disease: Secondary | ICD-10-CM | POA: Diagnosis not present

## 2017-09-12 DIAGNOSIS — N2581 Secondary hyperparathyroidism of renal origin: Secondary | ICD-10-CM | POA: Diagnosis not present

## 2017-09-12 DIAGNOSIS — N186 End stage renal disease: Secondary | ICD-10-CM | POA: Diagnosis not present

## 2017-09-12 DIAGNOSIS — Z23 Encounter for immunization: Secondary | ICD-10-CM | POA: Diagnosis not present

## 2017-09-13 DIAGNOSIS — N186 End stage renal disease: Secondary | ICD-10-CM | POA: Diagnosis not present

## 2017-09-13 DIAGNOSIS — Z992 Dependence on renal dialysis: Secondary | ICD-10-CM | POA: Diagnosis not present

## 2017-09-13 DIAGNOSIS — I129 Hypertensive chronic kidney disease with stage 1 through stage 4 chronic kidney disease, or unspecified chronic kidney disease: Secondary | ICD-10-CM | POA: Diagnosis not present

## 2017-09-15 DIAGNOSIS — N186 End stage renal disease: Secondary | ICD-10-CM | POA: Diagnosis not present

## 2017-09-15 DIAGNOSIS — Z992 Dependence on renal dialysis: Secondary | ICD-10-CM | POA: Diagnosis not present

## 2017-09-15 DIAGNOSIS — N2581 Secondary hyperparathyroidism of renal origin: Secondary | ICD-10-CM | POA: Diagnosis not present

## 2017-09-17 DIAGNOSIS — N186 End stage renal disease: Secondary | ICD-10-CM | POA: Diagnosis not present

## 2017-09-17 DIAGNOSIS — N2581 Secondary hyperparathyroidism of renal origin: Secondary | ICD-10-CM | POA: Diagnosis not present

## 2017-09-17 DIAGNOSIS — Z992 Dependence on renal dialysis: Secondary | ICD-10-CM | POA: Diagnosis not present

## 2017-09-20 DIAGNOSIS — N186 End stage renal disease: Secondary | ICD-10-CM | POA: Diagnosis not present

## 2017-09-20 DIAGNOSIS — Z992 Dependence on renal dialysis: Secondary | ICD-10-CM | POA: Diagnosis not present

## 2017-09-20 DIAGNOSIS — N2581 Secondary hyperparathyroidism of renal origin: Secondary | ICD-10-CM | POA: Diagnosis not present

## 2017-09-22 DIAGNOSIS — N2581 Secondary hyperparathyroidism of renal origin: Secondary | ICD-10-CM | POA: Diagnosis not present

## 2017-09-22 DIAGNOSIS — N186 End stage renal disease: Secondary | ICD-10-CM | POA: Diagnosis not present

## 2017-09-22 DIAGNOSIS — Z992 Dependence on renal dialysis: Secondary | ICD-10-CM | POA: Diagnosis not present

## 2017-09-24 DIAGNOSIS — N2581 Secondary hyperparathyroidism of renal origin: Secondary | ICD-10-CM | POA: Diagnosis not present

## 2017-09-24 DIAGNOSIS — Z992 Dependence on renal dialysis: Secondary | ICD-10-CM | POA: Diagnosis not present

## 2017-09-24 DIAGNOSIS — N186 End stage renal disease: Secondary | ICD-10-CM | POA: Diagnosis not present

## 2017-09-27 DIAGNOSIS — N186 End stage renal disease: Secondary | ICD-10-CM | POA: Diagnosis not present

## 2017-09-27 DIAGNOSIS — N2581 Secondary hyperparathyroidism of renal origin: Secondary | ICD-10-CM | POA: Diagnosis not present

## 2017-09-27 DIAGNOSIS — Z992 Dependence on renal dialysis: Secondary | ICD-10-CM | POA: Diagnosis not present

## 2017-09-28 DIAGNOSIS — Z992 Dependence on renal dialysis: Secondary | ICD-10-CM | POA: Diagnosis not present

## 2017-09-28 DIAGNOSIS — M1A39X0 Chronic gout due to renal impairment, multiple sites, without tophus (tophi): Secondary | ICD-10-CM | POA: Diagnosis not present

## 2017-09-28 DIAGNOSIS — N186 End stage renal disease: Secondary | ICD-10-CM | POA: Diagnosis not present

## 2017-09-28 DIAGNOSIS — I420 Dilated cardiomyopathy: Secondary | ICD-10-CM | POA: Diagnosis not present

## 2017-09-29 DIAGNOSIS — Z992 Dependence on renal dialysis: Secondary | ICD-10-CM | POA: Diagnosis not present

## 2017-09-29 DIAGNOSIS — N2581 Secondary hyperparathyroidism of renal origin: Secondary | ICD-10-CM | POA: Diagnosis not present

## 2017-09-29 DIAGNOSIS — N186 End stage renal disease: Secondary | ICD-10-CM | POA: Diagnosis not present

## 2017-10-01 DIAGNOSIS — N186 End stage renal disease: Secondary | ICD-10-CM | POA: Diagnosis not present

## 2017-10-01 DIAGNOSIS — Z992 Dependence on renal dialysis: Secondary | ICD-10-CM | POA: Diagnosis not present

## 2017-10-01 DIAGNOSIS — N2581 Secondary hyperparathyroidism of renal origin: Secondary | ICD-10-CM | POA: Diagnosis not present

## 2017-10-04 DIAGNOSIS — Z992 Dependence on renal dialysis: Secondary | ICD-10-CM | POA: Diagnosis not present

## 2017-10-04 DIAGNOSIS — N2581 Secondary hyperparathyroidism of renal origin: Secondary | ICD-10-CM | POA: Diagnosis not present

## 2017-10-04 DIAGNOSIS — N186 End stage renal disease: Secondary | ICD-10-CM | POA: Diagnosis not present

## 2017-10-06 DIAGNOSIS — Z992 Dependence on renal dialysis: Secondary | ICD-10-CM | POA: Diagnosis not present

## 2017-10-06 DIAGNOSIS — N2581 Secondary hyperparathyroidism of renal origin: Secondary | ICD-10-CM | POA: Diagnosis not present

## 2017-10-06 DIAGNOSIS — N186 End stage renal disease: Secondary | ICD-10-CM | POA: Diagnosis not present

## 2017-10-08 DIAGNOSIS — Z992 Dependence on renal dialysis: Secondary | ICD-10-CM | POA: Diagnosis not present

## 2017-10-08 DIAGNOSIS — N2581 Secondary hyperparathyroidism of renal origin: Secondary | ICD-10-CM | POA: Diagnosis not present

## 2017-10-08 DIAGNOSIS — N186 End stage renal disease: Secondary | ICD-10-CM | POA: Diagnosis not present

## 2017-10-11 DIAGNOSIS — N2581 Secondary hyperparathyroidism of renal origin: Secondary | ICD-10-CM | POA: Diagnosis not present

## 2017-10-11 DIAGNOSIS — Z992 Dependence on renal dialysis: Secondary | ICD-10-CM | POA: Diagnosis not present

## 2017-10-11 DIAGNOSIS — N186 End stage renal disease: Secondary | ICD-10-CM | POA: Diagnosis not present

## 2017-10-13 DIAGNOSIS — Z992 Dependence on renal dialysis: Secondary | ICD-10-CM | POA: Diagnosis not present

## 2017-10-13 DIAGNOSIS — N186 End stage renal disease: Secondary | ICD-10-CM | POA: Diagnosis not present

## 2017-10-13 DIAGNOSIS — N2581 Secondary hyperparathyroidism of renal origin: Secondary | ICD-10-CM | POA: Diagnosis not present

## 2017-10-14 DIAGNOSIS — Z992 Dependence on renal dialysis: Secondary | ICD-10-CM | POA: Diagnosis not present

## 2017-10-14 DIAGNOSIS — N186 End stage renal disease: Secondary | ICD-10-CM | POA: Diagnosis not present

## 2017-10-14 DIAGNOSIS — I129 Hypertensive chronic kidney disease with stage 1 through stage 4 chronic kidney disease, or unspecified chronic kidney disease: Secondary | ICD-10-CM | POA: Diagnosis not present

## 2017-10-15 DIAGNOSIS — I129 Hypertensive chronic kidney disease with stage 1 through stage 4 chronic kidney disease, or unspecified chronic kidney disease: Secondary | ICD-10-CM | POA: Diagnosis not present

## 2017-10-15 DIAGNOSIS — Z992 Dependence on renal dialysis: Secondary | ICD-10-CM | POA: Diagnosis not present

## 2017-10-15 DIAGNOSIS — N2581 Secondary hyperparathyroidism of renal origin: Secondary | ICD-10-CM | POA: Diagnosis not present

## 2017-10-15 DIAGNOSIS — N186 End stage renal disease: Secondary | ICD-10-CM | POA: Diagnosis not present

## 2017-10-18 DIAGNOSIS — N2581 Secondary hyperparathyroidism of renal origin: Secondary | ICD-10-CM | POA: Diagnosis not present

## 2017-10-18 DIAGNOSIS — N186 End stage renal disease: Secondary | ICD-10-CM | POA: Diagnosis not present

## 2017-10-18 DIAGNOSIS — Z992 Dependence on renal dialysis: Secondary | ICD-10-CM | POA: Diagnosis not present

## 2017-10-20 DIAGNOSIS — N2581 Secondary hyperparathyroidism of renal origin: Secondary | ICD-10-CM | POA: Diagnosis not present

## 2017-10-20 DIAGNOSIS — Z992 Dependence on renal dialysis: Secondary | ICD-10-CM | POA: Diagnosis not present

## 2017-10-20 DIAGNOSIS — N186 End stage renal disease: Secondary | ICD-10-CM | POA: Diagnosis not present

## 2017-10-22 DIAGNOSIS — N2581 Secondary hyperparathyroidism of renal origin: Secondary | ICD-10-CM | POA: Diagnosis not present

## 2017-10-22 DIAGNOSIS — N186 End stage renal disease: Secondary | ICD-10-CM | POA: Diagnosis not present

## 2017-10-22 DIAGNOSIS — Z992 Dependence on renal dialysis: Secondary | ICD-10-CM | POA: Diagnosis not present

## 2017-10-25 DIAGNOSIS — N186 End stage renal disease: Secondary | ICD-10-CM | POA: Diagnosis not present

## 2017-10-25 DIAGNOSIS — Z992 Dependence on renal dialysis: Secondary | ICD-10-CM | POA: Diagnosis not present

## 2017-10-25 DIAGNOSIS — N2581 Secondary hyperparathyroidism of renal origin: Secondary | ICD-10-CM | POA: Diagnosis not present

## 2017-10-27 DIAGNOSIS — Z992 Dependence on renal dialysis: Secondary | ICD-10-CM | POA: Diagnosis not present

## 2017-10-27 DIAGNOSIS — N2581 Secondary hyperparathyroidism of renal origin: Secondary | ICD-10-CM | POA: Diagnosis not present

## 2017-10-27 DIAGNOSIS — N186 End stage renal disease: Secondary | ICD-10-CM | POA: Diagnosis not present

## 2017-10-29 DIAGNOSIS — N2581 Secondary hyperparathyroidism of renal origin: Secondary | ICD-10-CM | POA: Diagnosis not present

## 2017-10-29 DIAGNOSIS — Z992 Dependence on renal dialysis: Secondary | ICD-10-CM | POA: Diagnosis not present

## 2017-10-29 DIAGNOSIS — N186 End stage renal disease: Secondary | ICD-10-CM | POA: Diagnosis not present

## 2017-11-01 DIAGNOSIS — N2581 Secondary hyperparathyroidism of renal origin: Secondary | ICD-10-CM | POA: Diagnosis not present

## 2017-11-01 DIAGNOSIS — Z992 Dependence on renal dialysis: Secondary | ICD-10-CM | POA: Diagnosis not present

## 2017-11-01 DIAGNOSIS — N186 End stage renal disease: Secondary | ICD-10-CM | POA: Diagnosis not present

## 2017-11-03 DIAGNOSIS — N186 End stage renal disease: Secondary | ICD-10-CM | POA: Diagnosis not present

## 2017-11-03 DIAGNOSIS — Z992 Dependence on renal dialysis: Secondary | ICD-10-CM | POA: Diagnosis not present

## 2017-11-03 DIAGNOSIS — N2581 Secondary hyperparathyroidism of renal origin: Secondary | ICD-10-CM | POA: Diagnosis not present

## 2017-11-05 DIAGNOSIS — N186 End stage renal disease: Secondary | ICD-10-CM | POA: Diagnosis not present

## 2017-11-05 DIAGNOSIS — Z992 Dependence on renal dialysis: Secondary | ICD-10-CM | POA: Diagnosis not present

## 2017-11-05 DIAGNOSIS — N2581 Secondary hyperparathyroidism of renal origin: Secondary | ICD-10-CM | POA: Diagnosis not present

## 2017-11-08 DIAGNOSIS — N186 End stage renal disease: Secondary | ICD-10-CM | POA: Diagnosis not present

## 2017-11-08 DIAGNOSIS — Z992 Dependence on renal dialysis: Secondary | ICD-10-CM | POA: Diagnosis not present

## 2017-11-08 DIAGNOSIS — N2581 Secondary hyperparathyroidism of renal origin: Secondary | ICD-10-CM | POA: Diagnosis not present

## 2017-11-10 DIAGNOSIS — N186 End stage renal disease: Secondary | ICD-10-CM | POA: Diagnosis not present

## 2017-11-10 DIAGNOSIS — Z992 Dependence on renal dialysis: Secondary | ICD-10-CM | POA: Diagnosis not present

## 2017-11-10 DIAGNOSIS — N2581 Secondary hyperparathyroidism of renal origin: Secondary | ICD-10-CM | POA: Diagnosis not present

## 2017-11-12 DIAGNOSIS — I129 Hypertensive chronic kidney disease with stage 1 through stage 4 chronic kidney disease, or unspecified chronic kidney disease: Secondary | ICD-10-CM | POA: Diagnosis not present

## 2017-11-12 DIAGNOSIS — N2581 Secondary hyperparathyroidism of renal origin: Secondary | ICD-10-CM | POA: Diagnosis not present

## 2017-11-12 DIAGNOSIS — N186 End stage renal disease: Secondary | ICD-10-CM | POA: Diagnosis not present

## 2017-11-12 DIAGNOSIS — D631 Anemia in chronic kidney disease: Secondary | ICD-10-CM | POA: Diagnosis not present

## 2017-11-12 DIAGNOSIS — Z992 Dependence on renal dialysis: Secondary | ICD-10-CM | POA: Diagnosis not present

## 2017-11-12 DIAGNOSIS — D509 Iron deficiency anemia, unspecified: Secondary | ICD-10-CM | POA: Diagnosis not present

## 2017-11-15 DIAGNOSIS — D631 Anemia in chronic kidney disease: Secondary | ICD-10-CM | POA: Diagnosis not present

## 2017-11-15 DIAGNOSIS — Z992 Dependence on renal dialysis: Secondary | ICD-10-CM | POA: Diagnosis not present

## 2017-11-15 DIAGNOSIS — N186 End stage renal disease: Secondary | ICD-10-CM | POA: Diagnosis not present

## 2017-11-15 DIAGNOSIS — N2581 Secondary hyperparathyroidism of renal origin: Secondary | ICD-10-CM | POA: Diagnosis not present

## 2017-11-15 DIAGNOSIS — D509 Iron deficiency anemia, unspecified: Secondary | ICD-10-CM | POA: Diagnosis not present

## 2017-11-17 DIAGNOSIS — Z992 Dependence on renal dialysis: Secondary | ICD-10-CM | POA: Diagnosis not present

## 2017-11-17 DIAGNOSIS — N186 End stage renal disease: Secondary | ICD-10-CM | POA: Diagnosis not present

## 2017-11-17 DIAGNOSIS — N2581 Secondary hyperparathyroidism of renal origin: Secondary | ICD-10-CM | POA: Diagnosis not present

## 2017-11-17 DIAGNOSIS — D509 Iron deficiency anemia, unspecified: Secondary | ICD-10-CM | POA: Diagnosis not present

## 2017-11-17 DIAGNOSIS — D631 Anemia in chronic kidney disease: Secondary | ICD-10-CM | POA: Diagnosis not present

## 2017-11-19 DIAGNOSIS — D509 Iron deficiency anemia, unspecified: Secondary | ICD-10-CM | POA: Diagnosis not present

## 2017-11-19 DIAGNOSIS — D631 Anemia in chronic kidney disease: Secondary | ICD-10-CM | POA: Diagnosis not present

## 2017-11-19 DIAGNOSIS — N186 End stage renal disease: Secondary | ICD-10-CM | POA: Diagnosis not present

## 2017-11-19 DIAGNOSIS — N2581 Secondary hyperparathyroidism of renal origin: Secondary | ICD-10-CM | POA: Diagnosis not present

## 2017-11-19 DIAGNOSIS — Z992 Dependence on renal dialysis: Secondary | ICD-10-CM | POA: Diagnosis not present

## 2017-11-22 DIAGNOSIS — D631 Anemia in chronic kidney disease: Secondary | ICD-10-CM | POA: Diagnosis not present

## 2017-11-22 DIAGNOSIS — N2581 Secondary hyperparathyroidism of renal origin: Secondary | ICD-10-CM | POA: Diagnosis not present

## 2017-11-22 DIAGNOSIS — N186 End stage renal disease: Secondary | ICD-10-CM | POA: Diagnosis not present

## 2017-11-22 DIAGNOSIS — D509 Iron deficiency anemia, unspecified: Secondary | ICD-10-CM | POA: Diagnosis not present

## 2017-11-22 DIAGNOSIS — Z992 Dependence on renal dialysis: Secondary | ICD-10-CM | POA: Diagnosis not present

## 2017-11-24 DIAGNOSIS — N2581 Secondary hyperparathyroidism of renal origin: Secondary | ICD-10-CM | POA: Diagnosis not present

## 2017-11-24 DIAGNOSIS — D509 Iron deficiency anemia, unspecified: Secondary | ICD-10-CM | POA: Diagnosis not present

## 2017-11-24 DIAGNOSIS — N186 End stage renal disease: Secondary | ICD-10-CM | POA: Diagnosis not present

## 2017-11-24 DIAGNOSIS — Z992 Dependence on renal dialysis: Secondary | ICD-10-CM | POA: Diagnosis not present

## 2017-11-24 DIAGNOSIS — D631 Anemia in chronic kidney disease: Secondary | ICD-10-CM | POA: Diagnosis not present

## 2017-11-26 DIAGNOSIS — N186 End stage renal disease: Secondary | ICD-10-CM | POA: Diagnosis not present

## 2017-11-26 DIAGNOSIS — Z992 Dependence on renal dialysis: Secondary | ICD-10-CM | POA: Diagnosis not present

## 2017-11-26 DIAGNOSIS — N2581 Secondary hyperparathyroidism of renal origin: Secondary | ICD-10-CM | POA: Diagnosis not present

## 2017-11-26 DIAGNOSIS — D631 Anemia in chronic kidney disease: Secondary | ICD-10-CM | POA: Diagnosis not present

## 2017-11-26 DIAGNOSIS — D509 Iron deficiency anemia, unspecified: Secondary | ICD-10-CM | POA: Diagnosis not present

## 2017-11-29 DIAGNOSIS — N186 End stage renal disease: Secondary | ICD-10-CM | POA: Diagnosis not present

## 2017-11-29 DIAGNOSIS — D509 Iron deficiency anemia, unspecified: Secondary | ICD-10-CM | POA: Diagnosis not present

## 2017-11-29 DIAGNOSIS — Z992 Dependence on renal dialysis: Secondary | ICD-10-CM | POA: Diagnosis not present

## 2017-11-29 DIAGNOSIS — D631 Anemia in chronic kidney disease: Secondary | ICD-10-CM | POA: Diagnosis not present

## 2017-11-29 DIAGNOSIS — N2581 Secondary hyperparathyroidism of renal origin: Secondary | ICD-10-CM | POA: Diagnosis not present

## 2017-12-01 DIAGNOSIS — Z992 Dependence on renal dialysis: Secondary | ICD-10-CM | POA: Diagnosis not present

## 2017-12-01 DIAGNOSIS — D509 Iron deficiency anemia, unspecified: Secondary | ICD-10-CM | POA: Diagnosis not present

## 2017-12-01 DIAGNOSIS — N186 End stage renal disease: Secondary | ICD-10-CM | POA: Diagnosis not present

## 2017-12-01 DIAGNOSIS — D631 Anemia in chronic kidney disease: Secondary | ICD-10-CM | POA: Diagnosis not present

## 2017-12-01 DIAGNOSIS — N2581 Secondary hyperparathyroidism of renal origin: Secondary | ICD-10-CM | POA: Diagnosis not present

## 2017-12-03 DIAGNOSIS — Z992 Dependence on renal dialysis: Secondary | ICD-10-CM | POA: Diagnosis not present

## 2017-12-03 DIAGNOSIS — D631 Anemia in chronic kidney disease: Secondary | ICD-10-CM | POA: Diagnosis not present

## 2017-12-03 DIAGNOSIS — N2581 Secondary hyperparathyroidism of renal origin: Secondary | ICD-10-CM | POA: Diagnosis not present

## 2017-12-03 DIAGNOSIS — D509 Iron deficiency anemia, unspecified: Secondary | ICD-10-CM | POA: Diagnosis not present

## 2017-12-03 DIAGNOSIS — N186 End stage renal disease: Secondary | ICD-10-CM | POA: Diagnosis not present

## 2017-12-06 DIAGNOSIS — Z992 Dependence on renal dialysis: Secondary | ICD-10-CM | POA: Diagnosis not present

## 2017-12-06 DIAGNOSIS — N2581 Secondary hyperparathyroidism of renal origin: Secondary | ICD-10-CM | POA: Diagnosis not present

## 2017-12-06 DIAGNOSIS — D631 Anemia in chronic kidney disease: Secondary | ICD-10-CM | POA: Diagnosis not present

## 2017-12-06 DIAGNOSIS — N186 End stage renal disease: Secondary | ICD-10-CM | POA: Diagnosis not present

## 2017-12-06 DIAGNOSIS — D509 Iron deficiency anemia, unspecified: Secondary | ICD-10-CM | POA: Diagnosis not present

## 2017-12-08 DIAGNOSIS — N186 End stage renal disease: Secondary | ICD-10-CM | POA: Diagnosis not present

## 2017-12-08 DIAGNOSIS — D631 Anemia in chronic kidney disease: Secondary | ICD-10-CM | POA: Diagnosis not present

## 2017-12-08 DIAGNOSIS — D509 Iron deficiency anemia, unspecified: Secondary | ICD-10-CM | POA: Diagnosis not present

## 2017-12-08 DIAGNOSIS — Z992 Dependence on renal dialysis: Secondary | ICD-10-CM | POA: Diagnosis not present

## 2017-12-08 DIAGNOSIS — N2581 Secondary hyperparathyroidism of renal origin: Secondary | ICD-10-CM | POA: Diagnosis not present

## 2017-12-09 ENCOUNTER — Encounter: Payer: Self-pay | Admitting: Interventional Cardiology

## 2017-12-09 ENCOUNTER — Encounter (INDEPENDENT_AMBULATORY_CARE_PROVIDER_SITE_OTHER): Payer: Self-pay

## 2017-12-09 ENCOUNTER — Ambulatory Visit (INDEPENDENT_AMBULATORY_CARE_PROVIDER_SITE_OTHER): Payer: Medicare Other | Admitting: Interventional Cardiology

## 2017-12-09 VITALS — BP 102/70 | HR 106 | Ht 71.0 in | Wt 349.8 lb

## 2017-12-09 DIAGNOSIS — I10 Essential (primary) hypertension: Secondary | ICD-10-CM

## 2017-12-09 DIAGNOSIS — R000 Tachycardia, unspecified: Secondary | ICD-10-CM

## 2017-12-09 NOTE — Progress Notes (Signed)
Cardiology Office Note   Date:  12/09/2017   ID:  Noah Garner, DOB May 12, 1961, MRN [id number]  PCP:  Delilah Shan, MD    No chief complaint on file.  tachycardia  Wt Readings from Last 3 Encounters:  12/09/17 (!) 349 lb 12.8 oz (158.7 kg)  07/05/17 (!) 344 lb (156 kg)  06/29/17 (!) 344 lb (156 kg)       History of Present Illness: Noah Garner is a 57 y.o. male who is being seen today for the evaluation of tachycardia at the request of Delilah Shan, MD.  He has had low bP since starting dialysis, less than a year ago.  He started in 9/19. He has lost 80 lbs since starting dialysis.  His BP droppped at that time.  He does report that after being on dialysis for an hour, he gets a burning in the left leg.     He had an episode in 3/19 when he was tachycardic, and there was a question of AFib.  His only exercise is walking around his house.  No regular exercise.  EF 30-35% in 2016.  In 8/18, EF up to 50%.   Overall, he is doing much better after starting dialysis.  He feels much better after the significant weight loss.  He is working to try to get to under 300 pounds.  Blood pressures from records we received have been in the 678-938 range systolic, earlier in March 2019.  Past Medical History:  Diagnosis Date  . Anemia   . Arthritis   . CHF (congestive heart failure) ([place])   . Chronic kidney disease    stage 4  . Chronic systolic heart failure ([place]) 08/29/2015  . Hypertension   . Morbid obesity ([place])   . Rheumatoid arthritis ([place])   . Sleep apnea    wears CPAP    Past Surgical History:  Procedure Laterality Date  . AV FISTULA PLACEMENT Left 07/09/2016   Procedure: CREATION OF LEFT RADIOCEPHALIC ARTERIOVENOUS (AV) FISTULA;  Surgeon: Waynetta Sandy, MD;  Location: [facility];  Service: Vascular;  Laterality: Left;  . CARDIAC CATHETERIZATION     12/18/11: No sign CAD, elevated left heart pressures ([facility])  . KNEE SURGERY     torn  ligaments     Current Outpatient Medications  Medication Sig Dispense Refill  . allopurinol (ZYLOPRIM) 100 MG tablet Take 100 mg by mouth daily.    Marland Kitchen aspirin EC 81 MG tablet Take 81 mg by mouth daily.    Derrill Memo ON 07/01/2018] ferric citrate (AURYXIA) 1 GM 210 MG(Fe) tablet Take 420 mg by mouth. After meals Starting 07/01/18    . multivitamin (RENA-VIT) TABS tablet Take 1 tablet by mouth at bedtime.    . predniSONE (DELTASONE) 5 MG tablet Take 5 mg by mouth daily.     Marland Kitchen rOPINIRole (REQUIP) 0.5 MG tablet Take 0.5 mg by mouth at bedtime.    Marland Kitchen UNABLE TO FIND FLUID RESTRICTION - 1200 cc daily, 720cc dietary, 480cc nurse every shift.  Document amount given from nurse only should not esceed 480cc total per day.    . polyethylene glycol (MIRALAX / GLYCOLAX) packet Take 17 g by mouth 2 (two) times daily. (Patient not taking: Reported on 12/09/2017) 14 each 0  . senna (SENOKOT) 8.6 MG TABS tablet Take 1 tablet (8.6 mg total) by mouth at bedtime. (Patient not taking: Reported on 12/09/2017) 120 each 0  . traMADol (ULTRAM) 50 MG tablet Take 1  tablet (50 mg total) by mouth 2 (two) times daily. (Patient not taking: Reported on 12/09/2017) 60 tablet 0   No current facility-administered medications for this visit.     Allergies:   Patient has no known allergies.    Social History:  The patient  reports that he quit smoking about 22 years ago. He has never used smokeless tobacco. He reports that he does not drink alcohol or use drugs.   Family History:  The patient's family history includes Hypertension in his mother.    ROS:  Please see the history of present illness.   Otherwise, review of systems are positive for low BP, fatigue after dialysis.   All other systems are reviewed and negative.    PHYSICAL EXAM: VS:  BP 102/70   Pulse (!) 106   Ht 5\' 11"  (1.803 m)   Wt (!) 349 lb 12.8 oz (158.7 kg)   SpO2 95%   BMI 48.79 kg/m  , BMI Body mass index is 48.79 kg/m. GEN: Well nourished, well  developed, in no acute distress  HEENT: normal  Neck: no JVD, carotid bruits, or masses Cardiac: RRR; no murmurs, rubs, or gallops,no edema  Respiratory:  clear to auscultation bilaterally, normal work of breathing GI: soft, nontender, nondistended, + BS, obese MS: no deformity or atrophy  Skin: warm and dry, no rash Neuro:  Strength and sensation are intact Psych: euthymic mood, full affect   EKG:   The ekg ordered today demonstrates sinus tachycardia, RBBB   Recent Labs: 05/12/2017: B Natriuretic Peptide 34.2 05/13/2017: Magnesium 2.3 05/21/2017: ALT 33 05/24/2017: BUN 91; Creatinine, Ser 9.29; Hemoglobin 8.4; Platelets 259; Potassium 4.6; Sodium 134   Lipid Panel    Component Value Date/Time   CHOL 156 05/28/2017   TRIG 80 05/28/2017   HDL 67 05/28/2017   [place] 73 05/28/2017     Other studies Reviewed: Additional studies/ records that were reviewed today with results demonstrating: Dialysis records reviewed.   ASSESSMENT AND PLAN:  1. Tachycardia: No evidence of AFib today.  Mild sinus tachycardia with HR 101, on ECG.  BP today lower.  Could consider adding low dose beta blocker if BP increases.  He is more concerned about the low BP readings.  Not on any BP meds at this time.  2. EF now 50% by last echo.  Likely some diastolic heart failure.  Volume management through dialysis. 3. Morbid obesity: COntinue to try to lose weight through minimizing added sugar in his diet.  Exercise limited, but can try stationary bike to reduce risk of falling.    Current medicines are reviewed at length with the patient today.  The patient concerns regarding his medicines were addressed.  The following changes have been made:  No change  Labs/ tests ordered today include:  No orders of the defined types were placed in this encounter.   Recommend 150 minutes/week of aerobic exercise Low fat, low carb, high fiber diet recommended  Disposition:   FU as needed   Signed, Larae Grooms, MD  12/09/2017 1:30 PM    [facility] [address], [place], [place]  [address] Phone: [phone]; Fax: [phone]

## 2017-12-09 NOTE — Patient Instructions (Addendum)
Medication Instructions:  Your physician recommends that you continue on your current medications as directed. Please refer to the Current Medication list given to you today.   Labwork: None ordered  Testing/Procedures: None ordered  Follow-Up: Your physician wants you to follow-up AS NEEDED with Dr. Irish Lack  Any Other Special Instructions Will Be Listed Below (If Applicable).   Heart-Healthy Eating Plan Heart-healthy meal planning includes:  Limiting unhealthy fats.  Increasing healthy fats.  Making other small dietary changes.  You may need to talk with your doctor or a diet specialist (dietitian) to create an eating plan that is right for you. What types of fat should I choose?  Choose healthy fats. These include olive oil and canola oil, flaxseeds, walnuts, almonds, and seeds.  Eat more omega-3 fats. These include salmon, mackerel, sardines, tuna, flaxseed oil, and ground flaxseeds. Try to eat fish at least twice each week.  Limit saturated fats. ? Saturated fats are often found in animal products, such as meats, butter, and cream. ? Plant sources of saturated fats include palm oil, palm kernel oil, and coconut oil.  Avoid foods with partially hydrogenated oils in them. These include stick margarine, some tub margarines, cookies, crackers, and other baked goods. These contain trans fats. What general guidelines do I need to follow?  Check food labels carefully. Identify foods with trans fats or high amounts of saturated fat.  Fill one half of your plate with vegetables and green salads. Eat 4-5 servings of vegetables per day. A serving of vegetables is: ? 1 cup of raw leafy vegetables. ?  cup of raw or cooked cut-up vegetables. ?  cup of vegetable juice.  Fill one fourth of your plate with whole grains. Look for the word "whole" as the first word in the ingredient list.  Fill one fourth of your plate with lean protein foods.  Eat 4-5 servings of fruit per day.  A serving of fruit is: ? One medium whole fruit. ?  cup of dried fruit. ?  cup of fresh, frozen, or canned fruit. ?  cup of 100% fruit juice.  Eat more foods that contain soluble fiber. These include apples, broccoli, carrots, beans, peas, and barley. Try to get 20-30 g of fiber per day.  Eat more home-cooked food. Eat less restaurant, buffet, and fast food.  Limit or avoid alcohol.  Limit foods high in starch and sugar.  Avoid fried foods.  Avoid frying your food. Try baking, boiling, grilling, or broiling it instead. You can also reduce fat by: ? Removing the skin from poultry. ? Removing all visible fats from meats. ? Skimming the fat off of stews, soups, and gravies before serving them. ? Steaming vegetables in water or broth.  Lose weight if you are overweight.  Eat 4-5 servings of nuts, legumes, and seeds per week: ? One serving of dried beans or legumes equals  cup after being cooked. ? One serving of nuts equals 1 ounces. ? One serving of seeds equals  ounce or one tablespoon.  You may need to keep track of how much salt or sodium you eat. This is especially true if you have high blood pressure. Talk with your doctor or dietitian to get more information. What foods can I eat? Grains Breads, including [place], white, pita, wheat, raisin, rye, oatmeal, and [place]. Tortillas that are neither fried nor made with lard or trans fat. Low-fat rolls, including hotdog and hamburger buns and English muffins. Biscuits. Muffins. Waffles. Pancakes. Light popcorn. Whole-grain cereals. Flatbread.  Melba toast. Pretzels. Breadsticks. Rusks. Low-fat snacks. Low-fat crackers, including oyster, saltine, matzo, graham, animal, and rye. Rice and pasta, including brown rice and pastas that are made with whole wheat. Vegetables All vegetables. Fruits All fruits, but limit coconut. Meats and Other Protein Sources Lean, well-trimmed beef, veal, pork, and lamb. Chicken and [place] without  skin. All fish and shellfish. Wild duck, rabbit, pheasant, and venison. Egg whites or low-cholesterol egg substitutes. Dried beans, peas, lentils, and tofu. Seeds and most nuts. Dairy Low-fat or nonfat cheeses, including ricotta, string, and mozzarella. Skim or 1% milk that is liquid, powdered, or evaporated. Buttermilk that is made with low-fat milk. Nonfat or low-fat yogurt. Beverages Mineral water. Diet carbonated beverages. Sweets and Desserts Sherbets and fruit ices. Honey, jam, marmalade, jelly, and syrups. Meringues and gelatins. Pure sugar candy, such as hard candy, jelly beans, gumdrops, mints, marshmallows, and small amounts of dark chocolate. [employer] Eat all sweets and desserts in moderation. Fats and Oils Nonhydrogenated (trans-free) margarines. Vegetable oils, including soybean, sesame, sunflower, olive, peanut, safflower, corn, canola, and cottonseed. Salad dressings or mayonnaise made with a vegetable oil. Limit added fats and oils that you use for cooking, baking, salads, and as spreads. Other Cocoa powder. Coffee and tea. All seasonings and condiments. The items listed above may not be a complete list of recommended foods or beverages. Contact your dietitian for more options. What foods are not recommended? Grains Breads that are made with saturated or trans fats, oils, or whole milk. Croissants. Butter rolls. Cheese breads. Sweet rolls. Donuts. Buttered popcorn. Chow mein noodles. High-fat crackers, such as cheese or butter crackers. Meats and Other Protein Sources Fatty meats, such as hotdogs, short ribs, sausage, spareribs, bacon, rib eye roast or steak, and mutton. High-fat deli meats, such as salami and bologna. Caviar. Domestic duck and goose. Organ meats, such as kidney, liver, sweetbreads, and heart. Dairy Cream, sour cream, cream cheese, and creamed cottage cheese. Whole-milk cheeses, including blue (bleu), [place], [place], [place], American, [place], Swiss,  cheddar, [place], and [place]. Whole or 2% milk that is liquid, evaporated, or condensed. Whole buttermilk. Cream sauce or high-fat cheese sauce. Yogurt that is made from whole milk. Beverages Regular sodas and juice drinks with added sugar. Sweets and Desserts Frosting. Pudding. Cookies. Cakes other than angel food cake. Candy that has milk chocolate or white chocolate, hydrogenated fat, butter, coconut, or unknown ingredients. Buttered syrups. Full-fat ice cream or ice cream drinks. Fats and Oils Gravy that has suet, meat fat, or shortening. Cocoa butter, hydrogenated oils, palm oil, coconut oil, palm kernel oil. These can often be found in baked products, candy, fried foods, nondairy creamers, and whipped toppings. Solid fats and shortenings, including bacon fat, salt pork, lard, and butter. Nondairy cream substitutes, such as coffee creamers and sour cream substitutes. Salad dressings that are made of unknown oils, cheese, or sour cream. The items listed above may not be a complete list of foods and beverages to avoid. Contact your dietitian for more information. This information is not intended to replace advice given to you by your health care provider. Make sure you discuss any questions you have with your health care provider. Document Released: 03/01/2012 Document Revised: 02/06/2016 Document Reviewed: 02/22/2014 Elsevier Interactive Patient Education  2018 [employer].    Low-Sodium Eating Plan Sodium, which is an element that makes up salt, helps you maintain a healthy balance of fluids in your body. Too much sodium can increase your blood pressure and cause fluid and waste  to be held in your body. Your health care provider or dietitian may recommend following this plan if you have high blood pressure (hypertension), kidney disease, liver disease, or heart failure. Eating less sodium can help lower your blood pressure, reduce swelling, and protect your heart, liver, and kidneys. What  are tips for following this plan? General guidelines  Most people on this plan should limit their sodium intake to 1,500-2,000 mg (milligrams) of sodium each day. Reading food labels  The Nutrition Facts label lists the amount of sodium in one serving of the food. If you eat more than one serving, you must multiply the listed amount of sodium by the number of servings.  Choose foods with less than 140 mg of sodium per serving.  Avoid foods with 300 mg of sodium or more per serving. Shopping  Look for lower-sodium products, often labeled as "low-sodium" or "no salt added."  Always check the sodium content even if foods are labeled as "unsalted" or "no salt added".  Buy fresh foods. ? Avoid canned foods and premade or frozen meals. ? Avoid canned, cured, or processed meats  Buy breads that have less than 80 mg of sodium per slice. Cooking  Eat more home-cooked food and less restaurant, buffet, and fast food.  Avoid adding salt when cooking. Use salt-free seasonings or herbs instead of table salt or sea salt. Check with your health care provider or pharmacist before using salt substitutes.  Cook with plant-based oils, such as canola, sunflower, or olive oil. Meal planning  When eating at a restaurant, ask that your food be prepared with less salt or no salt, if possible.  Avoid foods that contain MSG (monosodium glutamate). MSG is sometimes added to [place] food, bouillon, and some canned foods. What foods are recommended? The items listed may not be a complete list. Talk with your dietitian about what dietary choices are best for you. Grains Low-sodium cereals, including oats, puffed wheat and rice, and shredded wheat. Low-sodium crackers. Unsalted rice. Unsalted pasta. Low-sodium bread. Whole-grain breads and whole-grain pasta. Vegetables Fresh or frozen vegetables. "No salt added" canned vegetables. "No salt added" tomato sauce and paste. Low-sodium or reduced-sodium tomato and  vegetable juice. Fruits Fresh, frozen, or canned fruit. Fruit juice. Meats and other protein foods Fresh or frozen (no salt added) meat, poultry, seafood, and fish. Low-sodium canned tuna and salmon. Unsalted nuts. Dried peas, beans, and lentils without added salt. Unsalted canned beans. Eggs. Unsalted nut butters. Dairy Milk. Soy milk. Cheese that is naturally low in sodium, such as ricotta cheese, fresh mozzarella, or Swiss cheese Low-sodium or reduced-sodium cheese. Cream cheese. Yogurt. Fats and oils Unsalted butter. Unsalted margarine with no trans fat. Vegetable oils such as canola or olive oils. Seasonings and other foods Fresh and dried herbs and spices. Salt-free seasonings. Low-sodium mustard and ketchup. Sodium-free salad dressing. Sodium-free light mayonnaise. Fresh or refrigerated horseradish. Lemon juice. Vinegar. Homemade, reduced-sodium, or low-sodium soups. Unsalted popcorn and pretzels. Low-salt or salt-free chips. What foods are not recommended? The items listed may not be a complete list. Talk with your dietitian about what dietary choices are best for you. Grains Instant hot cereals. Bread stuffing, pancake, and biscuit mixes. Croutons. Seasoned rice or pasta mixes. Noodle soup cups. Boxed or frozen macaroni and cheese. Regular salted crackers. Self-rising flour. Vegetables Sauerkraut, pickled vegetables, and relishes. Olives. [place] fries. Onion rings. Regular canned vegetables (not low-sodium or reduced-sodium). Regular canned tomato sauce and paste (not low-sodium or reduced-sodium). Regular tomato and vegetable juice (  not low-sodium or reduced-sodium). Frozen vegetables in sauces. Meats and other protein foods Meat or fish that is salted, canned, smoked, spiced, or pickled. Bacon, ham, sausage, hotdogs, corned beef, chipped beef, packaged lunch meats, salt pork, jerky, pickled herring, anchovies, regular canned tuna, sardines, salted nuts. Dairy Processed cheese and  cheese spreads. Cheese curds. Blue cheese. Feta cheese. String cheese. Regular cottage cheese. Buttermilk. Canned milk. Fats and oils Salted butter. Regular margarine. Ghee. Bacon fat. Seasonings and other foods Onion salt, garlic salt, seasoned salt, table salt, and sea salt. Canned and packaged gravies. Worcestershire sauce. Tartar sauce. Barbecue sauce. Teriyaki sauce. Soy sauce, including reduced-sodium. Steak sauce. Fish sauce. Oyster sauce. Cocktail sauce. Horseradish that you find on the shelf. Regular ketchup and mustard. Meat flavorings and tenderizers. Bouillon cubes. Hot sauce and Tabasco sauce. Premade or packaged marinades. Premade or packaged taco seasonings. Relishes. Regular salad dressings. Salsa. Potato and tortilla chips. Corn chips and puffs. Salted popcorn and pretzels. Canned or dried soups. Pizza. Frozen entrees and pot pies. Summary  Eating less sodium can help lower your blood pressure, reduce swelling, and protect your heart, liver, and kidneys.  Most people on this plan should limit their sodium intake to 1,500-2,000 mg (milligrams) of sodium each day.  Canned, boxed, and frozen foods are high in sodium. Restaurant foods, fast foods, and pizza are also very high in sodium. You also get sodium by adding salt to food.  Try to cook at home, eat more fresh fruits and vegetables, and eat less fast food, canned, processed, or prepared foods. This information is not intended to replace advice given to you by your health care provider. Make sure you discuss any questions you have with your health care provider. Document Released: 02/20/2002 Document Revised: 08/24/2016 Document Reviewed: 08/24/2016 Elsevier Interactive Patient Education  [employer].    If you need a refill on your cardiac medications before your next appointment, please call your pharmacy.

## 2017-12-10 DIAGNOSIS — N2581 Secondary hyperparathyroidism of renal origin: Secondary | ICD-10-CM | POA: Diagnosis not present

## 2017-12-10 DIAGNOSIS — D509 Iron deficiency anemia, unspecified: Secondary | ICD-10-CM | POA: Diagnosis not present

## 2017-12-10 DIAGNOSIS — Z992 Dependence on renal dialysis: Secondary | ICD-10-CM | POA: Diagnosis not present

## 2017-12-10 DIAGNOSIS — D631 Anemia in chronic kidney disease: Secondary | ICD-10-CM | POA: Diagnosis not present

## 2017-12-10 DIAGNOSIS — N186 End stage renal disease: Secondary | ICD-10-CM | POA: Diagnosis not present

## 2017-12-13 DIAGNOSIS — N186 End stage renal disease: Secondary | ICD-10-CM | POA: Diagnosis not present

## 2017-12-13 DIAGNOSIS — I129 Hypertensive chronic kidney disease with stage 1 through stage 4 chronic kidney disease, or unspecified chronic kidney disease: Secondary | ICD-10-CM | POA: Diagnosis not present

## 2017-12-13 DIAGNOSIS — Z23 Encounter for immunization: Secondary | ICD-10-CM | POA: Diagnosis not present

## 2017-12-13 DIAGNOSIS — Z283 Underimmunization status: Secondary | ICD-10-CM | POA: Diagnosis not present

## 2017-12-13 DIAGNOSIS — Z992 Dependence on renal dialysis: Secondary | ICD-10-CM | POA: Diagnosis not present

## 2017-12-13 DIAGNOSIS — D631 Anemia in chronic kidney disease: Secondary | ICD-10-CM | POA: Diagnosis not present

## 2017-12-13 DIAGNOSIS — N2581 Secondary hyperparathyroidism of renal origin: Secondary | ICD-10-CM | POA: Diagnosis not present

## 2017-12-13 DIAGNOSIS — D509 Iron deficiency anemia, unspecified: Secondary | ICD-10-CM | POA: Diagnosis not present

## 2017-12-15 DIAGNOSIS — Z992 Dependence on renal dialysis: Secondary | ICD-10-CM | POA: Diagnosis not present

## 2017-12-15 DIAGNOSIS — N186 End stage renal disease: Secondary | ICD-10-CM | POA: Diagnosis not present

## 2017-12-15 DIAGNOSIS — N2581 Secondary hyperparathyroidism of renal origin: Secondary | ICD-10-CM | POA: Diagnosis not present

## 2017-12-15 DIAGNOSIS — Z283 Underimmunization status: Secondary | ICD-10-CM | POA: Diagnosis not present

## 2017-12-15 DIAGNOSIS — Z23 Encounter for immunization: Secondary | ICD-10-CM | POA: Diagnosis not present

## 2017-12-15 DIAGNOSIS — D631 Anemia in chronic kidney disease: Secondary | ICD-10-CM | POA: Diagnosis not present

## 2017-12-17 DIAGNOSIS — Z23 Encounter for immunization: Secondary | ICD-10-CM | POA: Diagnosis not present

## 2017-12-17 DIAGNOSIS — N186 End stage renal disease: Secondary | ICD-10-CM | POA: Diagnosis not present

## 2017-12-17 DIAGNOSIS — Z283 Underimmunization status: Secondary | ICD-10-CM | POA: Diagnosis not present

## 2017-12-17 DIAGNOSIS — N2581 Secondary hyperparathyroidism of renal origin: Secondary | ICD-10-CM | POA: Diagnosis not present

## 2017-12-17 DIAGNOSIS — D631 Anemia in chronic kidney disease: Secondary | ICD-10-CM | POA: Diagnosis not present

## 2017-12-17 DIAGNOSIS — Z992 Dependence on renal dialysis: Secondary | ICD-10-CM | POA: Diagnosis not present

## 2017-12-20 DIAGNOSIS — N2581 Secondary hyperparathyroidism of renal origin: Secondary | ICD-10-CM | POA: Diagnosis not present

## 2017-12-20 DIAGNOSIS — Z283 Underimmunization status: Secondary | ICD-10-CM | POA: Diagnosis not present

## 2017-12-20 DIAGNOSIS — Z23 Encounter for immunization: Secondary | ICD-10-CM | POA: Diagnosis not present

## 2017-12-20 DIAGNOSIS — D631 Anemia in chronic kidney disease: Secondary | ICD-10-CM | POA: Diagnosis not present

## 2017-12-20 DIAGNOSIS — Z992 Dependence on renal dialysis: Secondary | ICD-10-CM | POA: Diagnosis not present

## 2017-12-20 DIAGNOSIS — N186 End stage renal disease: Secondary | ICD-10-CM | POA: Diagnosis not present

## 2017-12-24 DIAGNOSIS — Z23 Encounter for immunization: Secondary | ICD-10-CM | POA: Diagnosis not present

## 2017-12-24 DIAGNOSIS — Z283 Underimmunization status: Secondary | ICD-10-CM | POA: Diagnosis not present

## 2017-12-24 DIAGNOSIS — D631 Anemia in chronic kidney disease: Secondary | ICD-10-CM | POA: Diagnosis not present

## 2017-12-24 DIAGNOSIS — N186 End stage renal disease: Secondary | ICD-10-CM | POA: Diagnosis not present

## 2017-12-24 DIAGNOSIS — N2581 Secondary hyperparathyroidism of renal origin: Secondary | ICD-10-CM | POA: Diagnosis not present

## 2017-12-24 DIAGNOSIS — Z992 Dependence on renal dialysis: Secondary | ICD-10-CM | POA: Diagnosis not present

## 2017-12-27 DIAGNOSIS — N186 End stage renal disease: Secondary | ICD-10-CM | POA: Diagnosis not present

## 2017-12-27 DIAGNOSIS — N2581 Secondary hyperparathyroidism of renal origin: Secondary | ICD-10-CM | POA: Diagnosis not present

## 2017-12-27 DIAGNOSIS — D631 Anemia in chronic kidney disease: Secondary | ICD-10-CM | POA: Diagnosis not present

## 2017-12-27 DIAGNOSIS — Z283 Underimmunization status: Secondary | ICD-10-CM | POA: Diagnosis not present

## 2017-12-27 DIAGNOSIS — Z992 Dependence on renal dialysis: Secondary | ICD-10-CM | POA: Diagnosis not present

## 2017-12-27 DIAGNOSIS — Z23 Encounter for immunization: Secondary | ICD-10-CM | POA: Diagnosis not present

## 2017-12-29 DIAGNOSIS — N186 End stage renal disease: Secondary | ICD-10-CM | POA: Diagnosis not present

## 2017-12-29 DIAGNOSIS — D631 Anemia in chronic kidney disease: Secondary | ICD-10-CM | POA: Diagnosis not present

## 2017-12-29 DIAGNOSIS — Z23 Encounter for immunization: Secondary | ICD-10-CM | POA: Diagnosis not present

## 2017-12-29 DIAGNOSIS — N2581 Secondary hyperparathyroidism of renal origin: Secondary | ICD-10-CM | POA: Diagnosis not present

## 2017-12-29 DIAGNOSIS — Z992 Dependence on renal dialysis: Secondary | ICD-10-CM | POA: Diagnosis not present

## 2017-12-29 DIAGNOSIS — Z283 Underimmunization status: Secondary | ICD-10-CM | POA: Diagnosis not present

## 2017-12-31 DIAGNOSIS — Z992 Dependence on renal dialysis: Secondary | ICD-10-CM | POA: Diagnosis not present

## 2017-12-31 DIAGNOSIS — D631 Anemia in chronic kidney disease: Secondary | ICD-10-CM | POA: Diagnosis not present

## 2017-12-31 DIAGNOSIS — Z283 Underimmunization status: Secondary | ICD-10-CM | POA: Diagnosis not present

## 2017-12-31 DIAGNOSIS — N2581 Secondary hyperparathyroidism of renal origin: Secondary | ICD-10-CM | POA: Diagnosis not present

## 2017-12-31 DIAGNOSIS — Z23 Encounter for immunization: Secondary | ICD-10-CM | POA: Diagnosis not present

## 2017-12-31 DIAGNOSIS — N186 End stage renal disease: Secondary | ICD-10-CM | POA: Diagnosis not present

## 2018-01-03 DIAGNOSIS — D631 Anemia in chronic kidney disease: Secondary | ICD-10-CM | POA: Diagnosis not present

## 2018-01-03 DIAGNOSIS — N2581 Secondary hyperparathyroidism of renal origin: Secondary | ICD-10-CM | POA: Diagnosis not present

## 2018-01-03 DIAGNOSIS — N186 End stage renal disease: Secondary | ICD-10-CM | POA: Diagnosis not present

## 2018-01-03 DIAGNOSIS — Z992 Dependence on renal dialysis: Secondary | ICD-10-CM | POA: Diagnosis not present

## 2018-01-03 DIAGNOSIS — Z23 Encounter for immunization: Secondary | ICD-10-CM | POA: Diagnosis not present

## 2018-01-03 DIAGNOSIS — Z283 Underimmunization status: Secondary | ICD-10-CM | POA: Diagnosis not present

## 2018-01-05 DIAGNOSIS — N186 End stage renal disease: Secondary | ICD-10-CM | POA: Diagnosis not present

## 2018-01-05 DIAGNOSIS — Z283 Underimmunization status: Secondary | ICD-10-CM | POA: Diagnosis not present

## 2018-01-05 DIAGNOSIS — D631 Anemia in chronic kidney disease: Secondary | ICD-10-CM | POA: Diagnosis not present

## 2018-01-05 DIAGNOSIS — Z23 Encounter for immunization: Secondary | ICD-10-CM | POA: Diagnosis not present

## 2018-01-05 DIAGNOSIS — N2581 Secondary hyperparathyroidism of renal origin: Secondary | ICD-10-CM | POA: Diagnosis not present

## 2018-01-05 DIAGNOSIS — Z992 Dependence on renal dialysis: Secondary | ICD-10-CM | POA: Diagnosis not present

## 2018-01-07 DIAGNOSIS — D631 Anemia in chronic kidney disease: Secondary | ICD-10-CM | POA: Diagnosis not present

## 2018-01-07 DIAGNOSIS — Z283 Underimmunization status: Secondary | ICD-10-CM | POA: Diagnosis not present

## 2018-01-07 DIAGNOSIS — Z992 Dependence on renal dialysis: Secondary | ICD-10-CM | POA: Diagnosis not present

## 2018-01-07 DIAGNOSIS — N2581 Secondary hyperparathyroidism of renal origin: Secondary | ICD-10-CM | POA: Diagnosis not present

## 2018-01-07 DIAGNOSIS — N186 End stage renal disease: Secondary | ICD-10-CM | POA: Diagnosis not present

## 2018-01-07 DIAGNOSIS — Z23 Encounter for immunization: Secondary | ICD-10-CM | POA: Diagnosis not present

## 2018-01-10 DIAGNOSIS — N186 End stage renal disease: Secondary | ICD-10-CM | POA: Diagnosis not present

## 2018-01-10 DIAGNOSIS — D631 Anemia in chronic kidney disease: Secondary | ICD-10-CM | POA: Diagnosis not present

## 2018-01-10 DIAGNOSIS — Z283 Underimmunization status: Secondary | ICD-10-CM | POA: Diagnosis not present

## 2018-01-10 DIAGNOSIS — Z992 Dependence on renal dialysis: Secondary | ICD-10-CM | POA: Diagnosis not present

## 2018-01-10 DIAGNOSIS — Z23 Encounter for immunization: Secondary | ICD-10-CM | POA: Diagnosis not present

## 2018-01-10 DIAGNOSIS — N2581 Secondary hyperparathyroidism of renal origin: Secondary | ICD-10-CM | POA: Diagnosis not present

## 2018-01-12 DIAGNOSIS — I129 Hypertensive chronic kidney disease with stage 1 through stage 4 chronic kidney disease, or unspecified chronic kidney disease: Secondary | ICD-10-CM | POA: Diagnosis not present

## 2018-01-12 DIAGNOSIS — N186 End stage renal disease: Secondary | ICD-10-CM | POA: Diagnosis not present

## 2018-01-12 DIAGNOSIS — D509 Iron deficiency anemia, unspecified: Secondary | ICD-10-CM | POA: Diagnosis not present

## 2018-01-12 DIAGNOSIS — Z992 Dependence on renal dialysis: Secondary | ICD-10-CM | POA: Diagnosis not present

## 2018-01-12 DIAGNOSIS — N2581 Secondary hyperparathyroidism of renal origin: Secondary | ICD-10-CM | POA: Diagnosis not present

## 2018-01-12 DIAGNOSIS — D631 Anemia in chronic kidney disease: Secondary | ICD-10-CM | POA: Diagnosis not present

## 2018-01-14 DIAGNOSIS — N186 End stage renal disease: Secondary | ICD-10-CM | POA: Diagnosis not present

## 2018-01-14 DIAGNOSIS — D631 Anemia in chronic kidney disease: Secondary | ICD-10-CM | POA: Diagnosis not present

## 2018-01-14 DIAGNOSIS — N2581 Secondary hyperparathyroidism of renal origin: Secondary | ICD-10-CM | POA: Diagnosis not present

## 2018-01-14 DIAGNOSIS — Z992 Dependence on renal dialysis: Secondary | ICD-10-CM | POA: Diagnosis not present

## 2018-01-14 DIAGNOSIS — D509 Iron deficiency anemia, unspecified: Secondary | ICD-10-CM | POA: Diagnosis not present

## 2018-01-17 DIAGNOSIS — N186 End stage renal disease: Secondary | ICD-10-CM | POA: Diagnosis not present

## 2018-01-17 DIAGNOSIS — N2581 Secondary hyperparathyroidism of renal origin: Secondary | ICD-10-CM | POA: Diagnosis not present

## 2018-01-17 DIAGNOSIS — D509 Iron deficiency anemia, unspecified: Secondary | ICD-10-CM | POA: Diagnosis not present

## 2018-01-17 DIAGNOSIS — Z992 Dependence on renal dialysis: Secondary | ICD-10-CM | POA: Diagnosis not present

## 2018-01-17 DIAGNOSIS — D631 Anemia in chronic kidney disease: Secondary | ICD-10-CM | POA: Diagnosis not present

## 2018-01-19 DIAGNOSIS — D631 Anemia in chronic kidney disease: Secondary | ICD-10-CM | POA: Diagnosis not present

## 2018-01-19 DIAGNOSIS — Z992 Dependence on renal dialysis: Secondary | ICD-10-CM | POA: Diagnosis not present

## 2018-01-19 DIAGNOSIS — D509 Iron deficiency anemia, unspecified: Secondary | ICD-10-CM | POA: Diagnosis not present

## 2018-01-19 DIAGNOSIS — N2581 Secondary hyperparathyroidism of renal origin: Secondary | ICD-10-CM | POA: Diagnosis not present

## 2018-01-19 DIAGNOSIS — N186 End stage renal disease: Secondary | ICD-10-CM | POA: Diagnosis not present

## 2018-01-21 DIAGNOSIS — N186 End stage renal disease: Secondary | ICD-10-CM | POA: Diagnosis not present

## 2018-01-21 DIAGNOSIS — Z992 Dependence on renal dialysis: Secondary | ICD-10-CM | POA: Diagnosis not present

## 2018-01-21 DIAGNOSIS — D509 Iron deficiency anemia, unspecified: Secondary | ICD-10-CM | POA: Diagnosis not present

## 2018-01-21 DIAGNOSIS — D631 Anemia in chronic kidney disease: Secondary | ICD-10-CM | POA: Diagnosis not present

## 2018-01-21 DIAGNOSIS — N2581 Secondary hyperparathyroidism of renal origin: Secondary | ICD-10-CM | POA: Diagnosis not present

## 2018-01-24 DIAGNOSIS — N186 End stage renal disease: Secondary | ICD-10-CM | POA: Diagnosis not present

## 2018-01-24 DIAGNOSIS — N2581 Secondary hyperparathyroidism of renal origin: Secondary | ICD-10-CM | POA: Diagnosis not present

## 2018-01-24 DIAGNOSIS — D509 Iron deficiency anemia, unspecified: Secondary | ICD-10-CM | POA: Diagnosis not present

## 2018-01-24 DIAGNOSIS — D631 Anemia in chronic kidney disease: Secondary | ICD-10-CM | POA: Diagnosis not present

## 2018-01-24 DIAGNOSIS — Z992 Dependence on renal dialysis: Secondary | ICD-10-CM | POA: Diagnosis not present

## 2018-01-26 DIAGNOSIS — Z992 Dependence on renal dialysis: Secondary | ICD-10-CM | POA: Diagnosis not present

## 2018-01-26 DIAGNOSIS — D631 Anemia in chronic kidney disease: Secondary | ICD-10-CM | POA: Diagnosis not present

## 2018-01-26 DIAGNOSIS — D509 Iron deficiency anemia, unspecified: Secondary | ICD-10-CM | POA: Diagnosis not present

## 2018-01-26 DIAGNOSIS — N186 End stage renal disease: Secondary | ICD-10-CM | POA: Diagnosis not present

## 2018-01-26 DIAGNOSIS — N2581 Secondary hyperparathyroidism of renal origin: Secondary | ICD-10-CM | POA: Diagnosis not present

## 2018-01-28 DIAGNOSIS — Z992 Dependence on renal dialysis: Secondary | ICD-10-CM | POA: Diagnosis not present

## 2018-01-28 DIAGNOSIS — N2581 Secondary hyperparathyroidism of renal origin: Secondary | ICD-10-CM | POA: Diagnosis not present

## 2018-01-28 DIAGNOSIS — D631 Anemia in chronic kidney disease: Secondary | ICD-10-CM | POA: Diagnosis not present

## 2018-01-28 DIAGNOSIS — D509 Iron deficiency anemia, unspecified: Secondary | ICD-10-CM | POA: Diagnosis not present

## 2018-01-28 DIAGNOSIS — N186 End stage renal disease: Secondary | ICD-10-CM | POA: Diagnosis not present

## 2018-01-31 DIAGNOSIS — N2581 Secondary hyperparathyroidism of renal origin: Secondary | ICD-10-CM | POA: Diagnosis not present

## 2018-01-31 DIAGNOSIS — D631 Anemia in chronic kidney disease: Secondary | ICD-10-CM | POA: Diagnosis not present

## 2018-01-31 DIAGNOSIS — N186 End stage renal disease: Secondary | ICD-10-CM | POA: Diagnosis not present

## 2018-01-31 DIAGNOSIS — Z992 Dependence on renal dialysis: Secondary | ICD-10-CM | POA: Diagnosis not present

## 2018-01-31 DIAGNOSIS — D509 Iron deficiency anemia, unspecified: Secondary | ICD-10-CM | POA: Diagnosis not present

## 2018-02-02 DIAGNOSIS — N186 End stage renal disease: Secondary | ICD-10-CM | POA: Diagnosis not present

## 2018-02-02 DIAGNOSIS — D509 Iron deficiency anemia, unspecified: Secondary | ICD-10-CM | POA: Diagnosis not present

## 2018-02-02 DIAGNOSIS — Z992 Dependence on renal dialysis: Secondary | ICD-10-CM | POA: Diagnosis not present

## 2018-02-02 DIAGNOSIS — D631 Anemia in chronic kidney disease: Secondary | ICD-10-CM | POA: Diagnosis not present

## 2018-02-02 DIAGNOSIS — N2581 Secondary hyperparathyroidism of renal origin: Secondary | ICD-10-CM | POA: Diagnosis not present

## 2018-02-04 DIAGNOSIS — D631 Anemia in chronic kidney disease: Secondary | ICD-10-CM | POA: Diagnosis not present

## 2018-02-04 DIAGNOSIS — N2581 Secondary hyperparathyroidism of renal origin: Secondary | ICD-10-CM | POA: Diagnosis not present

## 2018-02-04 DIAGNOSIS — D509 Iron deficiency anemia, unspecified: Secondary | ICD-10-CM | POA: Diagnosis not present

## 2018-02-04 DIAGNOSIS — Z992 Dependence on renal dialysis: Secondary | ICD-10-CM | POA: Diagnosis not present

## 2018-02-04 DIAGNOSIS — N186 End stage renal disease: Secondary | ICD-10-CM | POA: Diagnosis not present

## 2018-02-07 DIAGNOSIS — Z992 Dependence on renal dialysis: Secondary | ICD-10-CM | POA: Diagnosis not present

## 2018-02-07 DIAGNOSIS — N2581 Secondary hyperparathyroidism of renal origin: Secondary | ICD-10-CM | POA: Diagnosis not present

## 2018-02-07 DIAGNOSIS — D631 Anemia in chronic kidney disease: Secondary | ICD-10-CM | POA: Diagnosis not present

## 2018-02-07 DIAGNOSIS — D509 Iron deficiency anemia, unspecified: Secondary | ICD-10-CM | POA: Diagnosis not present

## 2018-02-07 DIAGNOSIS — N186 End stage renal disease: Secondary | ICD-10-CM | POA: Diagnosis not present

## 2018-02-09 DIAGNOSIS — Z992 Dependence on renal dialysis: Secondary | ICD-10-CM | POA: Diagnosis not present

## 2018-02-09 DIAGNOSIS — N186 End stage renal disease: Secondary | ICD-10-CM | POA: Diagnosis not present

## 2018-02-09 DIAGNOSIS — D631 Anemia in chronic kidney disease: Secondary | ICD-10-CM | POA: Diagnosis not present

## 2018-02-09 DIAGNOSIS — N2581 Secondary hyperparathyroidism of renal origin: Secondary | ICD-10-CM | POA: Diagnosis not present

## 2018-02-09 DIAGNOSIS — D509 Iron deficiency anemia, unspecified: Secondary | ICD-10-CM | POA: Diagnosis not present

## 2018-02-11 DIAGNOSIS — D509 Iron deficiency anemia, unspecified: Secondary | ICD-10-CM | POA: Diagnosis not present

## 2018-02-11 DIAGNOSIS — Z992 Dependence on renal dialysis: Secondary | ICD-10-CM | POA: Diagnosis not present

## 2018-02-11 DIAGNOSIS — N2581 Secondary hyperparathyroidism of renal origin: Secondary | ICD-10-CM | POA: Diagnosis not present

## 2018-02-11 DIAGNOSIS — N186 End stage renal disease: Secondary | ICD-10-CM | POA: Diagnosis not present

## 2018-02-11 DIAGNOSIS — D631 Anemia in chronic kidney disease: Secondary | ICD-10-CM | POA: Diagnosis not present

## 2018-02-12 DIAGNOSIS — I129 Hypertensive chronic kidney disease with stage 1 through stage 4 chronic kidney disease, or unspecified chronic kidney disease: Secondary | ICD-10-CM | POA: Diagnosis not present

## 2018-02-12 DIAGNOSIS — Z992 Dependence on renal dialysis: Secondary | ICD-10-CM | POA: Diagnosis not present

## 2018-02-12 DIAGNOSIS — N186 End stage renal disease: Secondary | ICD-10-CM | POA: Diagnosis not present

## 2018-02-14 DIAGNOSIS — N186 End stage renal disease: Secondary | ICD-10-CM | POA: Diagnosis not present

## 2018-02-14 DIAGNOSIS — N2581 Secondary hyperparathyroidism of renal origin: Secondary | ICD-10-CM | POA: Diagnosis not present

## 2018-02-14 DIAGNOSIS — D631 Anemia in chronic kidney disease: Secondary | ICD-10-CM | POA: Diagnosis not present

## 2018-02-14 DIAGNOSIS — Z992 Dependence on renal dialysis: Secondary | ICD-10-CM | POA: Diagnosis not present

## 2018-02-14 DIAGNOSIS — D509 Iron deficiency anemia, unspecified: Secondary | ICD-10-CM | POA: Diagnosis not present

## 2018-02-16 DIAGNOSIS — N2581 Secondary hyperparathyroidism of renal origin: Secondary | ICD-10-CM | POA: Diagnosis not present

## 2018-02-16 DIAGNOSIS — D631 Anemia in chronic kidney disease: Secondary | ICD-10-CM | POA: Diagnosis not present

## 2018-02-16 DIAGNOSIS — N186 End stage renal disease: Secondary | ICD-10-CM | POA: Diagnosis not present

## 2018-02-16 DIAGNOSIS — D509 Iron deficiency anemia, unspecified: Secondary | ICD-10-CM | POA: Diagnosis not present

## 2018-02-16 DIAGNOSIS — Z992 Dependence on renal dialysis: Secondary | ICD-10-CM | POA: Diagnosis not present

## 2018-02-18 DIAGNOSIS — D509 Iron deficiency anemia, unspecified: Secondary | ICD-10-CM | POA: Diagnosis not present

## 2018-02-18 DIAGNOSIS — Z992 Dependence on renal dialysis: Secondary | ICD-10-CM | POA: Diagnosis not present

## 2018-02-18 DIAGNOSIS — N186 End stage renal disease: Secondary | ICD-10-CM | POA: Diagnosis not present

## 2018-02-18 DIAGNOSIS — D631 Anemia in chronic kidney disease: Secondary | ICD-10-CM | POA: Diagnosis not present

## 2018-02-18 DIAGNOSIS — N2581 Secondary hyperparathyroidism of renal origin: Secondary | ICD-10-CM | POA: Diagnosis not present

## 2018-02-21 DIAGNOSIS — N186 End stage renal disease: Secondary | ICD-10-CM | POA: Diagnosis not present

## 2018-02-21 DIAGNOSIS — Z992 Dependence on renal dialysis: Secondary | ICD-10-CM | POA: Diagnosis not present

## 2018-02-21 DIAGNOSIS — D509 Iron deficiency anemia, unspecified: Secondary | ICD-10-CM | POA: Diagnosis not present

## 2018-02-21 DIAGNOSIS — N2581 Secondary hyperparathyroidism of renal origin: Secondary | ICD-10-CM | POA: Diagnosis not present

## 2018-02-21 DIAGNOSIS — D631 Anemia in chronic kidney disease: Secondary | ICD-10-CM | POA: Diagnosis not present

## 2018-02-23 DIAGNOSIS — D631 Anemia in chronic kidney disease: Secondary | ICD-10-CM | POA: Diagnosis not present

## 2018-02-23 DIAGNOSIS — D509 Iron deficiency anemia, unspecified: Secondary | ICD-10-CM | POA: Diagnosis not present

## 2018-02-23 DIAGNOSIS — N2581 Secondary hyperparathyroidism of renal origin: Secondary | ICD-10-CM | POA: Diagnosis not present

## 2018-02-23 DIAGNOSIS — Z992 Dependence on renal dialysis: Secondary | ICD-10-CM | POA: Diagnosis not present

## 2018-02-23 DIAGNOSIS — N186 End stage renal disease: Secondary | ICD-10-CM | POA: Diagnosis not present

## 2018-02-25 DIAGNOSIS — N2581 Secondary hyperparathyroidism of renal origin: Secondary | ICD-10-CM | POA: Diagnosis not present

## 2018-02-25 DIAGNOSIS — Z992 Dependence on renal dialysis: Secondary | ICD-10-CM | POA: Diagnosis not present

## 2018-02-25 DIAGNOSIS — D631 Anemia in chronic kidney disease: Secondary | ICD-10-CM | POA: Diagnosis not present

## 2018-02-25 DIAGNOSIS — N186 End stage renal disease: Secondary | ICD-10-CM | POA: Diagnosis not present

## 2018-02-25 DIAGNOSIS — D509 Iron deficiency anemia, unspecified: Secondary | ICD-10-CM | POA: Diagnosis not present

## 2018-02-28 DIAGNOSIS — Z992 Dependence on renal dialysis: Secondary | ICD-10-CM | POA: Diagnosis not present

## 2018-02-28 DIAGNOSIS — D631 Anemia in chronic kidney disease: Secondary | ICD-10-CM | POA: Diagnosis not present

## 2018-02-28 DIAGNOSIS — D509 Iron deficiency anemia, unspecified: Secondary | ICD-10-CM | POA: Diagnosis not present

## 2018-02-28 DIAGNOSIS — N186 End stage renal disease: Secondary | ICD-10-CM | POA: Diagnosis not present

## 2018-02-28 DIAGNOSIS — N2581 Secondary hyperparathyroidism of renal origin: Secondary | ICD-10-CM | POA: Diagnosis not present

## 2018-03-02 DIAGNOSIS — N186 End stage renal disease: Secondary | ICD-10-CM | POA: Diagnosis not present

## 2018-03-02 DIAGNOSIS — Z992 Dependence on renal dialysis: Secondary | ICD-10-CM | POA: Diagnosis not present

## 2018-03-02 DIAGNOSIS — N2581 Secondary hyperparathyroidism of renal origin: Secondary | ICD-10-CM | POA: Diagnosis not present

## 2018-03-02 DIAGNOSIS — D509 Iron deficiency anemia, unspecified: Secondary | ICD-10-CM | POA: Diagnosis not present

## 2018-03-02 DIAGNOSIS — D631 Anemia in chronic kidney disease: Secondary | ICD-10-CM | POA: Diagnosis not present

## 2018-03-04 DIAGNOSIS — Z992 Dependence on renal dialysis: Secondary | ICD-10-CM | POA: Diagnosis not present

## 2018-03-04 DIAGNOSIS — N186 End stage renal disease: Secondary | ICD-10-CM | POA: Diagnosis not present

## 2018-03-04 DIAGNOSIS — N2581 Secondary hyperparathyroidism of renal origin: Secondary | ICD-10-CM | POA: Diagnosis not present

## 2018-03-04 DIAGNOSIS — D509 Iron deficiency anemia, unspecified: Secondary | ICD-10-CM | POA: Diagnosis not present

## 2018-03-04 DIAGNOSIS — D631 Anemia in chronic kidney disease: Secondary | ICD-10-CM | POA: Diagnosis not present

## 2018-03-07 DIAGNOSIS — Z992 Dependence on renal dialysis: Secondary | ICD-10-CM | POA: Diagnosis not present

## 2018-03-07 DIAGNOSIS — D509 Iron deficiency anemia, unspecified: Secondary | ICD-10-CM | POA: Diagnosis not present

## 2018-03-07 DIAGNOSIS — N2581 Secondary hyperparathyroidism of renal origin: Secondary | ICD-10-CM | POA: Diagnosis not present

## 2018-03-07 DIAGNOSIS — D631 Anemia in chronic kidney disease: Secondary | ICD-10-CM | POA: Diagnosis not present

## 2018-03-07 DIAGNOSIS — N186 End stage renal disease: Secondary | ICD-10-CM | POA: Diagnosis not present

## 2018-03-09 DIAGNOSIS — N2581 Secondary hyperparathyroidism of renal origin: Secondary | ICD-10-CM | POA: Diagnosis not present

## 2018-03-09 DIAGNOSIS — D509 Iron deficiency anemia, unspecified: Secondary | ICD-10-CM | POA: Diagnosis not present

## 2018-03-09 DIAGNOSIS — D631 Anemia in chronic kidney disease: Secondary | ICD-10-CM | POA: Diagnosis not present

## 2018-03-09 DIAGNOSIS — Z992 Dependence on renal dialysis: Secondary | ICD-10-CM | POA: Diagnosis not present

## 2018-03-09 DIAGNOSIS — N186 End stage renal disease: Secondary | ICD-10-CM | POA: Diagnosis not present

## 2018-03-11 DIAGNOSIS — Z992 Dependence on renal dialysis: Secondary | ICD-10-CM | POA: Diagnosis not present

## 2018-03-11 DIAGNOSIS — N2581 Secondary hyperparathyroidism of renal origin: Secondary | ICD-10-CM | POA: Diagnosis not present

## 2018-03-11 DIAGNOSIS — D631 Anemia in chronic kidney disease: Secondary | ICD-10-CM | POA: Diagnosis not present

## 2018-03-11 DIAGNOSIS — D509 Iron deficiency anemia, unspecified: Secondary | ICD-10-CM | POA: Diagnosis not present

## 2018-03-11 DIAGNOSIS — N186 End stage renal disease: Secondary | ICD-10-CM | POA: Diagnosis not present

## 2018-03-14 DIAGNOSIS — Z23 Encounter for immunization: Secondary | ICD-10-CM | POA: Diagnosis not present

## 2018-03-14 DIAGNOSIS — N2581 Secondary hyperparathyroidism of renal origin: Secondary | ICD-10-CM | POA: Diagnosis not present

## 2018-03-14 DIAGNOSIS — D631 Anemia in chronic kidney disease: Secondary | ICD-10-CM | POA: Diagnosis not present

## 2018-03-14 DIAGNOSIS — N186 End stage renal disease: Secondary | ICD-10-CM | POA: Diagnosis not present

## 2018-03-14 DIAGNOSIS — I129 Hypertensive chronic kidney disease with stage 1 through stage 4 chronic kidney disease, or unspecified chronic kidney disease: Secondary | ICD-10-CM | POA: Diagnosis not present

## 2018-03-14 DIAGNOSIS — Z992 Dependence on renal dialysis: Secondary | ICD-10-CM | POA: Diagnosis not present

## 2018-03-14 DIAGNOSIS — Z283 Underimmunization status: Secondary | ICD-10-CM | POA: Diagnosis not present

## 2018-03-14 DIAGNOSIS — D509 Iron deficiency anemia, unspecified: Secondary | ICD-10-CM | POA: Diagnosis not present

## 2018-03-16 DIAGNOSIS — N186 End stage renal disease: Secondary | ICD-10-CM | POA: Diagnosis not present

## 2018-03-16 DIAGNOSIS — D631 Anemia in chronic kidney disease: Secondary | ICD-10-CM | POA: Diagnosis not present

## 2018-03-16 DIAGNOSIS — D509 Iron deficiency anemia, unspecified: Secondary | ICD-10-CM | POA: Diagnosis not present

## 2018-03-16 DIAGNOSIS — Z283 Underimmunization status: Secondary | ICD-10-CM | POA: Diagnosis not present

## 2018-03-16 DIAGNOSIS — N2581 Secondary hyperparathyroidism of renal origin: Secondary | ICD-10-CM | POA: Diagnosis not present

## 2018-03-16 DIAGNOSIS — Z992 Dependence on renal dialysis: Secondary | ICD-10-CM | POA: Diagnosis not present

## 2018-03-18 DIAGNOSIS — D631 Anemia in chronic kidney disease: Secondary | ICD-10-CM | POA: Diagnosis not present

## 2018-03-18 DIAGNOSIS — D509 Iron deficiency anemia, unspecified: Secondary | ICD-10-CM | POA: Diagnosis not present

## 2018-03-18 DIAGNOSIS — N186 End stage renal disease: Secondary | ICD-10-CM | POA: Diagnosis not present

## 2018-03-18 DIAGNOSIS — Z283 Underimmunization status: Secondary | ICD-10-CM | POA: Diagnosis not present

## 2018-03-18 DIAGNOSIS — Z992 Dependence on renal dialysis: Secondary | ICD-10-CM | POA: Diagnosis not present

## 2018-03-18 DIAGNOSIS — N2581 Secondary hyperparathyroidism of renal origin: Secondary | ICD-10-CM | POA: Diagnosis not present

## 2018-03-21 DIAGNOSIS — N2581 Secondary hyperparathyroidism of renal origin: Secondary | ICD-10-CM | POA: Diagnosis not present

## 2018-03-21 DIAGNOSIS — D509 Iron deficiency anemia, unspecified: Secondary | ICD-10-CM | POA: Diagnosis not present

## 2018-03-21 DIAGNOSIS — N186 End stage renal disease: Secondary | ICD-10-CM | POA: Diagnosis not present

## 2018-03-21 DIAGNOSIS — D631 Anemia in chronic kidney disease: Secondary | ICD-10-CM | POA: Diagnosis not present

## 2018-03-21 DIAGNOSIS — Z283 Underimmunization status: Secondary | ICD-10-CM | POA: Diagnosis not present

## 2018-03-21 DIAGNOSIS — Z992 Dependence on renal dialysis: Secondary | ICD-10-CM | POA: Diagnosis not present

## 2018-03-23 DIAGNOSIS — D631 Anemia in chronic kidney disease: Secondary | ICD-10-CM | POA: Diagnosis not present

## 2018-03-23 DIAGNOSIS — N186 End stage renal disease: Secondary | ICD-10-CM | POA: Diagnosis not present

## 2018-03-23 DIAGNOSIS — Z992 Dependence on renal dialysis: Secondary | ICD-10-CM | POA: Diagnosis not present

## 2018-03-23 DIAGNOSIS — Z283 Underimmunization status: Secondary | ICD-10-CM | POA: Diagnosis not present

## 2018-03-23 DIAGNOSIS — N2581 Secondary hyperparathyroidism of renal origin: Secondary | ICD-10-CM | POA: Diagnosis not present

## 2018-03-23 DIAGNOSIS — D509 Iron deficiency anemia, unspecified: Secondary | ICD-10-CM | POA: Diagnosis not present

## 2018-03-25 DIAGNOSIS — D631 Anemia in chronic kidney disease: Secondary | ICD-10-CM | POA: Diagnosis not present

## 2018-03-25 DIAGNOSIS — Z283 Underimmunization status: Secondary | ICD-10-CM | POA: Diagnosis not present

## 2018-03-25 DIAGNOSIS — N2581 Secondary hyperparathyroidism of renal origin: Secondary | ICD-10-CM | POA: Diagnosis not present

## 2018-03-25 DIAGNOSIS — D509 Iron deficiency anemia, unspecified: Secondary | ICD-10-CM | POA: Diagnosis not present

## 2018-03-25 DIAGNOSIS — N186 End stage renal disease: Secondary | ICD-10-CM | POA: Diagnosis not present

## 2018-03-25 DIAGNOSIS — Z992 Dependence on renal dialysis: Secondary | ICD-10-CM | POA: Diagnosis not present

## 2018-03-28 DIAGNOSIS — Z992 Dependence on renal dialysis: Secondary | ICD-10-CM | POA: Diagnosis not present

## 2018-03-28 DIAGNOSIS — D631 Anemia in chronic kidney disease: Secondary | ICD-10-CM | POA: Diagnosis not present

## 2018-03-28 DIAGNOSIS — N2581 Secondary hyperparathyroidism of renal origin: Secondary | ICD-10-CM | POA: Diagnosis not present

## 2018-03-28 DIAGNOSIS — N186 End stage renal disease: Secondary | ICD-10-CM | POA: Diagnosis not present

## 2018-03-28 DIAGNOSIS — Z283 Underimmunization status: Secondary | ICD-10-CM | POA: Diagnosis not present

## 2018-03-28 DIAGNOSIS — D509 Iron deficiency anemia, unspecified: Secondary | ICD-10-CM | POA: Diagnosis not present

## 2018-03-30 DIAGNOSIS — D509 Iron deficiency anemia, unspecified: Secondary | ICD-10-CM | POA: Diagnosis not present

## 2018-03-30 DIAGNOSIS — Z283 Underimmunization status: Secondary | ICD-10-CM | POA: Diagnosis not present

## 2018-03-30 DIAGNOSIS — D631 Anemia in chronic kidney disease: Secondary | ICD-10-CM | POA: Diagnosis not present

## 2018-03-30 DIAGNOSIS — N186 End stage renal disease: Secondary | ICD-10-CM | POA: Diagnosis not present

## 2018-03-30 DIAGNOSIS — Z992 Dependence on renal dialysis: Secondary | ICD-10-CM | POA: Diagnosis not present

## 2018-03-30 DIAGNOSIS — N2581 Secondary hyperparathyroidism of renal origin: Secondary | ICD-10-CM | POA: Diagnosis not present

## 2018-04-01 DIAGNOSIS — N2581 Secondary hyperparathyroidism of renal origin: Secondary | ICD-10-CM | POA: Diagnosis not present

## 2018-04-01 DIAGNOSIS — D509 Iron deficiency anemia, unspecified: Secondary | ICD-10-CM | POA: Diagnosis not present

## 2018-04-01 DIAGNOSIS — Z992 Dependence on renal dialysis: Secondary | ICD-10-CM | POA: Diagnosis not present

## 2018-04-01 DIAGNOSIS — Z283 Underimmunization status: Secondary | ICD-10-CM | POA: Diagnosis not present

## 2018-04-01 DIAGNOSIS — N186 End stage renal disease: Secondary | ICD-10-CM | POA: Diagnosis not present

## 2018-04-01 DIAGNOSIS — D631 Anemia in chronic kidney disease: Secondary | ICD-10-CM | POA: Diagnosis not present

## 2018-04-04 DIAGNOSIS — Z992 Dependence on renal dialysis: Secondary | ICD-10-CM | POA: Diagnosis not present

## 2018-04-04 DIAGNOSIS — D631 Anemia in chronic kidney disease: Secondary | ICD-10-CM | POA: Diagnosis not present

## 2018-04-04 DIAGNOSIS — Z283 Underimmunization status: Secondary | ICD-10-CM | POA: Diagnosis not present

## 2018-04-04 DIAGNOSIS — N2581 Secondary hyperparathyroidism of renal origin: Secondary | ICD-10-CM | POA: Diagnosis not present

## 2018-04-04 DIAGNOSIS — D509 Iron deficiency anemia, unspecified: Secondary | ICD-10-CM | POA: Diagnosis not present

## 2018-04-04 DIAGNOSIS — N186 End stage renal disease: Secondary | ICD-10-CM | POA: Diagnosis not present

## 2018-04-06 DIAGNOSIS — N2581 Secondary hyperparathyroidism of renal origin: Secondary | ICD-10-CM | POA: Diagnosis not present

## 2018-04-06 DIAGNOSIS — N186 End stage renal disease: Secondary | ICD-10-CM | POA: Diagnosis not present

## 2018-04-06 DIAGNOSIS — D631 Anemia in chronic kidney disease: Secondary | ICD-10-CM | POA: Diagnosis not present

## 2018-04-06 DIAGNOSIS — Z992 Dependence on renal dialysis: Secondary | ICD-10-CM | POA: Diagnosis not present

## 2018-04-06 DIAGNOSIS — D509 Iron deficiency anemia, unspecified: Secondary | ICD-10-CM | POA: Diagnosis not present

## 2018-04-06 DIAGNOSIS — Z283 Underimmunization status: Secondary | ICD-10-CM | POA: Diagnosis not present

## 2018-04-08 DIAGNOSIS — N186 End stage renal disease: Secondary | ICD-10-CM | POA: Diagnosis not present

## 2018-04-08 DIAGNOSIS — D631 Anemia in chronic kidney disease: Secondary | ICD-10-CM | POA: Diagnosis not present

## 2018-04-08 DIAGNOSIS — N2581 Secondary hyperparathyroidism of renal origin: Secondary | ICD-10-CM | POA: Diagnosis not present

## 2018-04-08 DIAGNOSIS — Z992 Dependence on renal dialysis: Secondary | ICD-10-CM | POA: Diagnosis not present

## 2018-04-08 DIAGNOSIS — D509 Iron deficiency anemia, unspecified: Secondary | ICD-10-CM | POA: Diagnosis not present

## 2018-04-08 DIAGNOSIS — Z283 Underimmunization status: Secondary | ICD-10-CM | POA: Diagnosis not present

## 2018-04-11 DIAGNOSIS — N186 End stage renal disease: Secondary | ICD-10-CM | POA: Diagnosis not present

## 2018-04-11 DIAGNOSIS — D631 Anemia in chronic kidney disease: Secondary | ICD-10-CM | POA: Diagnosis not present

## 2018-04-11 DIAGNOSIS — N2581 Secondary hyperparathyroidism of renal origin: Secondary | ICD-10-CM | POA: Diagnosis not present

## 2018-04-11 DIAGNOSIS — Z283 Underimmunization status: Secondary | ICD-10-CM | POA: Diagnosis not present

## 2018-04-11 DIAGNOSIS — Z992 Dependence on renal dialysis: Secondary | ICD-10-CM | POA: Diagnosis not present

## 2018-04-11 DIAGNOSIS — D509 Iron deficiency anemia, unspecified: Secondary | ICD-10-CM | POA: Diagnosis not present

## 2018-04-13 DIAGNOSIS — Z992 Dependence on renal dialysis: Secondary | ICD-10-CM | POA: Diagnosis not present

## 2018-04-13 DIAGNOSIS — D509 Iron deficiency anemia, unspecified: Secondary | ICD-10-CM | POA: Diagnosis not present

## 2018-04-13 DIAGNOSIS — Z283 Underimmunization status: Secondary | ICD-10-CM | POA: Diagnosis not present

## 2018-04-13 DIAGNOSIS — N186 End stage renal disease: Secondary | ICD-10-CM | POA: Diagnosis not present

## 2018-04-13 DIAGNOSIS — N2581 Secondary hyperparathyroidism of renal origin: Secondary | ICD-10-CM | POA: Diagnosis not present

## 2018-04-13 DIAGNOSIS — D631 Anemia in chronic kidney disease: Secondary | ICD-10-CM | POA: Diagnosis not present

## 2018-04-14 DIAGNOSIS — I129 Hypertensive chronic kidney disease with stage 1 through stage 4 chronic kidney disease, or unspecified chronic kidney disease: Secondary | ICD-10-CM | POA: Diagnosis not present

## 2018-04-14 DIAGNOSIS — N186 End stage renal disease: Secondary | ICD-10-CM | POA: Diagnosis not present

## 2018-04-14 DIAGNOSIS — Z992 Dependence on renal dialysis: Secondary | ICD-10-CM | POA: Diagnosis not present

## 2018-04-15 DIAGNOSIS — Z283 Underimmunization status: Secondary | ICD-10-CM | POA: Diagnosis not present

## 2018-04-15 DIAGNOSIS — D631 Anemia in chronic kidney disease: Secondary | ICD-10-CM | POA: Diagnosis not present

## 2018-04-15 DIAGNOSIS — Z23 Encounter for immunization: Secondary | ICD-10-CM | POA: Diagnosis not present

## 2018-04-15 DIAGNOSIS — N186 End stage renal disease: Secondary | ICD-10-CM | POA: Diagnosis not present

## 2018-04-15 DIAGNOSIS — N2581 Secondary hyperparathyroidism of renal origin: Secondary | ICD-10-CM | POA: Diagnosis not present

## 2018-04-15 DIAGNOSIS — Z992 Dependence on renal dialysis: Secondary | ICD-10-CM | POA: Diagnosis not present

## 2018-04-18 DIAGNOSIS — Z992 Dependence on renal dialysis: Secondary | ICD-10-CM | POA: Diagnosis not present

## 2018-04-18 DIAGNOSIS — Z283 Underimmunization status: Secondary | ICD-10-CM | POA: Diagnosis not present

## 2018-04-18 DIAGNOSIS — N186 End stage renal disease: Secondary | ICD-10-CM | POA: Diagnosis not present

## 2018-04-18 DIAGNOSIS — D631 Anemia in chronic kidney disease: Secondary | ICD-10-CM | POA: Diagnosis not present

## 2018-04-18 DIAGNOSIS — N2581 Secondary hyperparathyroidism of renal origin: Secondary | ICD-10-CM | POA: Diagnosis not present

## 2018-04-18 DIAGNOSIS — Z23 Encounter for immunization: Secondary | ICD-10-CM | POA: Diagnosis not present

## 2018-04-20 DIAGNOSIS — Z283 Underimmunization status: Secondary | ICD-10-CM | POA: Diagnosis not present

## 2018-04-20 DIAGNOSIS — N2581 Secondary hyperparathyroidism of renal origin: Secondary | ICD-10-CM | POA: Diagnosis not present

## 2018-04-20 DIAGNOSIS — N186 End stage renal disease: Secondary | ICD-10-CM | POA: Diagnosis not present

## 2018-04-20 DIAGNOSIS — Z23 Encounter for immunization: Secondary | ICD-10-CM | POA: Diagnosis not present

## 2018-04-20 DIAGNOSIS — D631 Anemia in chronic kidney disease: Secondary | ICD-10-CM | POA: Diagnosis not present

## 2018-04-20 DIAGNOSIS — Z992 Dependence on renal dialysis: Secondary | ICD-10-CM | POA: Diagnosis not present

## 2018-04-22 DIAGNOSIS — Z23 Encounter for immunization: Secondary | ICD-10-CM | POA: Diagnosis not present

## 2018-04-22 DIAGNOSIS — N2581 Secondary hyperparathyroidism of renal origin: Secondary | ICD-10-CM | POA: Diagnosis not present

## 2018-04-22 DIAGNOSIS — Z992 Dependence on renal dialysis: Secondary | ICD-10-CM | POA: Diagnosis not present

## 2018-04-22 DIAGNOSIS — Z283 Underimmunization status: Secondary | ICD-10-CM | POA: Diagnosis not present

## 2018-04-22 DIAGNOSIS — D631 Anemia in chronic kidney disease: Secondary | ICD-10-CM | POA: Diagnosis not present

## 2018-04-22 DIAGNOSIS — N186 End stage renal disease: Secondary | ICD-10-CM | POA: Diagnosis not present

## 2018-04-25 DIAGNOSIS — N2581 Secondary hyperparathyroidism of renal origin: Secondary | ICD-10-CM | POA: Diagnosis not present

## 2018-04-25 DIAGNOSIS — D631 Anemia in chronic kidney disease: Secondary | ICD-10-CM | POA: Diagnosis not present

## 2018-04-25 DIAGNOSIS — Z992 Dependence on renal dialysis: Secondary | ICD-10-CM | POA: Diagnosis not present

## 2018-04-25 DIAGNOSIS — Z23 Encounter for immunization: Secondary | ICD-10-CM | POA: Diagnosis not present

## 2018-04-25 DIAGNOSIS — Z283 Underimmunization status: Secondary | ICD-10-CM | POA: Diagnosis not present

## 2018-04-25 DIAGNOSIS — N186 End stage renal disease: Secondary | ICD-10-CM | POA: Diagnosis not present

## 2018-04-27 DIAGNOSIS — D631 Anemia in chronic kidney disease: Secondary | ICD-10-CM | POA: Diagnosis not present

## 2018-04-27 DIAGNOSIS — N2581 Secondary hyperparathyroidism of renal origin: Secondary | ICD-10-CM | POA: Diagnosis not present

## 2018-04-27 DIAGNOSIS — Z23 Encounter for immunization: Secondary | ICD-10-CM | POA: Diagnosis not present

## 2018-04-27 DIAGNOSIS — Z283 Underimmunization status: Secondary | ICD-10-CM | POA: Diagnosis not present

## 2018-04-27 DIAGNOSIS — Z992 Dependence on renal dialysis: Secondary | ICD-10-CM | POA: Diagnosis not present

## 2018-04-27 DIAGNOSIS — N186 End stage renal disease: Secondary | ICD-10-CM | POA: Diagnosis not present

## 2018-04-29 DIAGNOSIS — N186 End stage renal disease: Secondary | ICD-10-CM | POA: Diagnosis not present

## 2018-04-29 DIAGNOSIS — Z992 Dependence on renal dialysis: Secondary | ICD-10-CM | POA: Diagnosis not present

## 2018-04-29 DIAGNOSIS — Z23 Encounter for immunization: Secondary | ICD-10-CM | POA: Diagnosis not present

## 2018-04-29 DIAGNOSIS — N2581 Secondary hyperparathyroidism of renal origin: Secondary | ICD-10-CM | POA: Diagnosis not present

## 2018-04-29 DIAGNOSIS — D631 Anemia in chronic kidney disease: Secondary | ICD-10-CM | POA: Diagnosis not present

## 2018-04-29 DIAGNOSIS — Z283 Underimmunization status: Secondary | ICD-10-CM | POA: Diagnosis not present

## 2018-05-02 DIAGNOSIS — Z283 Underimmunization status: Secondary | ICD-10-CM | POA: Diagnosis not present

## 2018-05-02 DIAGNOSIS — N186 End stage renal disease: Secondary | ICD-10-CM | POA: Diagnosis not present

## 2018-05-02 DIAGNOSIS — Z992 Dependence on renal dialysis: Secondary | ICD-10-CM | POA: Diagnosis not present

## 2018-05-02 DIAGNOSIS — Z23 Encounter for immunization: Secondary | ICD-10-CM | POA: Diagnosis not present

## 2018-05-02 DIAGNOSIS — N2581 Secondary hyperparathyroidism of renal origin: Secondary | ICD-10-CM | POA: Diagnosis not present

## 2018-05-02 DIAGNOSIS — D631 Anemia in chronic kidney disease: Secondary | ICD-10-CM | POA: Diagnosis not present

## 2018-05-04 DIAGNOSIS — D631 Anemia in chronic kidney disease: Secondary | ICD-10-CM | POA: Diagnosis not present

## 2018-05-04 DIAGNOSIS — Z283 Underimmunization status: Secondary | ICD-10-CM | POA: Diagnosis not present

## 2018-05-04 DIAGNOSIS — N2581 Secondary hyperparathyroidism of renal origin: Secondary | ICD-10-CM | POA: Diagnosis not present

## 2018-05-04 DIAGNOSIS — N186 End stage renal disease: Secondary | ICD-10-CM | POA: Diagnosis not present

## 2018-05-04 DIAGNOSIS — Z992 Dependence on renal dialysis: Secondary | ICD-10-CM | POA: Diagnosis not present

## 2018-05-04 DIAGNOSIS — Z23 Encounter for immunization: Secondary | ICD-10-CM | POA: Diagnosis not present

## 2018-05-05 DIAGNOSIS — N186 End stage renal disease: Secondary | ICD-10-CM | POA: Diagnosis not present

## 2018-05-05 DIAGNOSIS — Z992 Dependence on renal dialysis: Secondary | ICD-10-CM | POA: Diagnosis not present

## 2018-05-05 DIAGNOSIS — T82858A Stenosis of vascular prosthetic devices, implants and grafts, initial encounter: Secondary | ICD-10-CM | POA: Diagnosis not present

## 2018-05-05 DIAGNOSIS — I871 Compression of vein: Secondary | ICD-10-CM | POA: Diagnosis not present

## 2018-05-06 DIAGNOSIS — D631 Anemia in chronic kidney disease: Secondary | ICD-10-CM | POA: Diagnosis not present

## 2018-05-06 DIAGNOSIS — Z992 Dependence on renal dialysis: Secondary | ICD-10-CM | POA: Diagnosis not present

## 2018-05-06 DIAGNOSIS — Z283 Underimmunization status: Secondary | ICD-10-CM | POA: Diagnosis not present

## 2018-05-06 DIAGNOSIS — N186 End stage renal disease: Secondary | ICD-10-CM | POA: Diagnosis not present

## 2018-05-06 DIAGNOSIS — N2581 Secondary hyperparathyroidism of renal origin: Secondary | ICD-10-CM | POA: Diagnosis not present

## 2018-05-06 DIAGNOSIS — Z23 Encounter for immunization: Secondary | ICD-10-CM | POA: Diagnosis not present

## 2018-05-09 DIAGNOSIS — N186 End stage renal disease: Secondary | ICD-10-CM | POA: Diagnosis not present

## 2018-05-09 DIAGNOSIS — Z283 Underimmunization status: Secondary | ICD-10-CM | POA: Diagnosis not present

## 2018-05-09 DIAGNOSIS — D631 Anemia in chronic kidney disease: Secondary | ICD-10-CM | POA: Diagnosis not present

## 2018-05-09 DIAGNOSIS — N2581 Secondary hyperparathyroidism of renal origin: Secondary | ICD-10-CM | POA: Diagnosis not present

## 2018-05-09 DIAGNOSIS — Z992 Dependence on renal dialysis: Secondary | ICD-10-CM | POA: Diagnosis not present

## 2018-05-09 DIAGNOSIS — Z23 Encounter for immunization: Secondary | ICD-10-CM | POA: Diagnosis not present

## 2018-05-11 DIAGNOSIS — Z992 Dependence on renal dialysis: Secondary | ICD-10-CM | POA: Diagnosis not present

## 2018-05-11 DIAGNOSIS — N186 End stage renal disease: Secondary | ICD-10-CM | POA: Diagnosis not present

## 2018-05-11 DIAGNOSIS — Z283 Underimmunization status: Secondary | ICD-10-CM | POA: Diagnosis not present

## 2018-05-11 DIAGNOSIS — N2581 Secondary hyperparathyroidism of renal origin: Secondary | ICD-10-CM | POA: Diagnosis not present

## 2018-05-11 DIAGNOSIS — Z23 Encounter for immunization: Secondary | ICD-10-CM | POA: Diagnosis not present

## 2018-05-11 DIAGNOSIS — D631 Anemia in chronic kidney disease: Secondary | ICD-10-CM | POA: Diagnosis not present

## 2018-05-13 DIAGNOSIS — N186 End stage renal disease: Secondary | ICD-10-CM | POA: Diagnosis not present

## 2018-05-13 DIAGNOSIS — D631 Anemia in chronic kidney disease: Secondary | ICD-10-CM | POA: Diagnosis not present

## 2018-05-13 DIAGNOSIS — N2581 Secondary hyperparathyroidism of renal origin: Secondary | ICD-10-CM | POA: Diagnosis not present

## 2018-05-13 DIAGNOSIS — Z992 Dependence on renal dialysis: Secondary | ICD-10-CM | POA: Diagnosis not present

## 2018-05-13 DIAGNOSIS — Z23 Encounter for immunization: Secondary | ICD-10-CM | POA: Diagnosis not present

## 2018-05-13 DIAGNOSIS — Z283 Underimmunization status: Secondary | ICD-10-CM | POA: Diagnosis not present

## 2018-05-15 DIAGNOSIS — N186 End stage renal disease: Secondary | ICD-10-CM | POA: Diagnosis not present

## 2018-05-15 DIAGNOSIS — I129 Hypertensive chronic kidney disease with stage 1 through stage 4 chronic kidney disease, or unspecified chronic kidney disease: Secondary | ICD-10-CM | POA: Diagnosis not present

## 2018-05-15 DIAGNOSIS — Z992 Dependence on renal dialysis: Secondary | ICD-10-CM | POA: Diagnosis not present

## 2018-05-16 DIAGNOSIS — N2581 Secondary hyperparathyroidism of renal origin: Secondary | ICD-10-CM | POA: Diagnosis not present

## 2018-05-16 DIAGNOSIS — D631 Anemia in chronic kidney disease: Secondary | ICD-10-CM | POA: Diagnosis not present

## 2018-05-16 DIAGNOSIS — Z992 Dependence on renal dialysis: Secondary | ICD-10-CM | POA: Diagnosis not present

## 2018-05-16 DIAGNOSIS — N186 End stage renal disease: Secondary | ICD-10-CM | POA: Diagnosis not present

## 2018-05-16 DIAGNOSIS — Z283 Underimmunization status: Secondary | ICD-10-CM | POA: Diagnosis not present

## 2018-05-16 DIAGNOSIS — Z23 Encounter for immunization: Secondary | ICD-10-CM | POA: Diagnosis not present

## 2018-05-18 ENCOUNTER — Other Ambulatory Visit: Payer: Self-pay

## 2018-05-18 DIAGNOSIS — N186 End stage renal disease: Secondary | ICD-10-CM | POA: Diagnosis not present

## 2018-05-18 DIAGNOSIS — D631 Anemia in chronic kidney disease: Secondary | ICD-10-CM | POA: Diagnosis not present

## 2018-05-18 DIAGNOSIS — Z283 Underimmunization status: Secondary | ICD-10-CM | POA: Diagnosis not present

## 2018-05-18 DIAGNOSIS — Z992 Dependence on renal dialysis: Secondary | ICD-10-CM | POA: Diagnosis not present

## 2018-05-18 DIAGNOSIS — N2581 Secondary hyperparathyroidism of renal origin: Secondary | ICD-10-CM | POA: Diagnosis not present

## 2018-05-18 DIAGNOSIS — T82510A Breakdown (mechanical) of surgically created arteriovenous fistula, initial encounter: Secondary | ICD-10-CM

## 2018-05-18 DIAGNOSIS — Z23 Encounter for immunization: Secondary | ICD-10-CM | POA: Diagnosis not present

## 2018-05-20 DIAGNOSIS — Z992 Dependence on renal dialysis: Secondary | ICD-10-CM | POA: Diagnosis not present

## 2018-05-20 DIAGNOSIS — N2581 Secondary hyperparathyroidism of renal origin: Secondary | ICD-10-CM | POA: Diagnosis not present

## 2018-05-20 DIAGNOSIS — N186 End stage renal disease: Secondary | ICD-10-CM | POA: Diagnosis not present

## 2018-05-20 DIAGNOSIS — Z23 Encounter for immunization: Secondary | ICD-10-CM | POA: Diagnosis not present

## 2018-05-20 DIAGNOSIS — Z283 Underimmunization status: Secondary | ICD-10-CM | POA: Diagnosis not present

## 2018-05-20 DIAGNOSIS — D631 Anemia in chronic kidney disease: Secondary | ICD-10-CM | POA: Diagnosis not present

## 2018-05-23 DIAGNOSIS — N186 End stage renal disease: Secondary | ICD-10-CM | POA: Diagnosis not present

## 2018-05-23 DIAGNOSIS — Z992 Dependence on renal dialysis: Secondary | ICD-10-CM | POA: Diagnosis not present

## 2018-05-23 DIAGNOSIS — Z283 Underimmunization status: Secondary | ICD-10-CM | POA: Diagnosis not present

## 2018-05-23 DIAGNOSIS — Z23 Encounter for immunization: Secondary | ICD-10-CM | POA: Diagnosis not present

## 2018-05-23 DIAGNOSIS — N2581 Secondary hyperparathyroidism of renal origin: Secondary | ICD-10-CM | POA: Diagnosis not present

## 2018-05-23 DIAGNOSIS — D631 Anemia in chronic kidney disease: Secondary | ICD-10-CM | POA: Diagnosis not present

## 2018-05-25 DIAGNOSIS — N2581 Secondary hyperparathyroidism of renal origin: Secondary | ICD-10-CM | POA: Diagnosis not present

## 2018-05-25 DIAGNOSIS — Z283 Underimmunization status: Secondary | ICD-10-CM | POA: Diagnosis not present

## 2018-05-25 DIAGNOSIS — Z992 Dependence on renal dialysis: Secondary | ICD-10-CM | POA: Diagnosis not present

## 2018-05-25 DIAGNOSIS — N186 End stage renal disease: Secondary | ICD-10-CM | POA: Diagnosis not present

## 2018-05-25 DIAGNOSIS — Z23 Encounter for immunization: Secondary | ICD-10-CM | POA: Diagnosis not present

## 2018-05-25 DIAGNOSIS — D631 Anemia in chronic kidney disease: Secondary | ICD-10-CM | POA: Diagnosis not present

## 2018-05-27 DIAGNOSIS — N2581 Secondary hyperparathyroidism of renal origin: Secondary | ICD-10-CM | POA: Diagnosis not present

## 2018-05-27 DIAGNOSIS — N186 End stage renal disease: Secondary | ICD-10-CM | POA: Diagnosis not present

## 2018-05-27 DIAGNOSIS — Z23 Encounter for immunization: Secondary | ICD-10-CM | POA: Diagnosis not present

## 2018-05-27 DIAGNOSIS — Z992 Dependence on renal dialysis: Secondary | ICD-10-CM | POA: Diagnosis not present

## 2018-05-27 DIAGNOSIS — Z283 Underimmunization status: Secondary | ICD-10-CM | POA: Diagnosis not present

## 2018-05-27 DIAGNOSIS — D631 Anemia in chronic kidney disease: Secondary | ICD-10-CM | POA: Diagnosis not present

## 2018-05-30 DIAGNOSIS — D631 Anemia in chronic kidney disease: Secondary | ICD-10-CM | POA: Diagnosis not present

## 2018-05-30 DIAGNOSIS — N186 End stage renal disease: Secondary | ICD-10-CM | POA: Diagnosis not present

## 2018-05-30 DIAGNOSIS — Z283 Underimmunization status: Secondary | ICD-10-CM | POA: Diagnosis not present

## 2018-05-30 DIAGNOSIS — N2581 Secondary hyperparathyroidism of renal origin: Secondary | ICD-10-CM | POA: Diagnosis not present

## 2018-05-30 DIAGNOSIS — Z23 Encounter for immunization: Secondary | ICD-10-CM | POA: Diagnosis not present

## 2018-05-30 DIAGNOSIS — Z992 Dependence on renal dialysis: Secondary | ICD-10-CM | POA: Diagnosis not present

## 2018-06-01 DIAGNOSIS — N186 End stage renal disease: Secondary | ICD-10-CM | POA: Diagnosis not present

## 2018-06-01 DIAGNOSIS — D631 Anemia in chronic kidney disease: Secondary | ICD-10-CM | POA: Diagnosis not present

## 2018-06-01 DIAGNOSIS — N2581 Secondary hyperparathyroidism of renal origin: Secondary | ICD-10-CM | POA: Diagnosis not present

## 2018-06-01 DIAGNOSIS — Z283 Underimmunization status: Secondary | ICD-10-CM | POA: Diagnosis not present

## 2018-06-01 DIAGNOSIS — Z992 Dependence on renal dialysis: Secondary | ICD-10-CM | POA: Diagnosis not present

## 2018-06-01 DIAGNOSIS — Z23 Encounter for immunization: Secondary | ICD-10-CM | POA: Diagnosis not present

## 2018-06-03 DIAGNOSIS — N2581 Secondary hyperparathyroidism of renal origin: Secondary | ICD-10-CM | POA: Diagnosis not present

## 2018-06-03 DIAGNOSIS — N186 End stage renal disease: Secondary | ICD-10-CM | POA: Diagnosis not present

## 2018-06-03 DIAGNOSIS — D631 Anemia in chronic kidney disease: Secondary | ICD-10-CM | POA: Diagnosis not present

## 2018-06-03 DIAGNOSIS — Z23 Encounter for immunization: Secondary | ICD-10-CM | POA: Diagnosis not present

## 2018-06-03 DIAGNOSIS — Z283 Underimmunization status: Secondary | ICD-10-CM | POA: Diagnosis not present

## 2018-06-03 DIAGNOSIS — Z992 Dependence on renal dialysis: Secondary | ICD-10-CM | POA: Diagnosis not present

## 2018-06-06 DIAGNOSIS — Z992 Dependence on renal dialysis: Secondary | ICD-10-CM | POA: Diagnosis not present

## 2018-06-06 DIAGNOSIS — Z23 Encounter for immunization: Secondary | ICD-10-CM | POA: Diagnosis not present

## 2018-06-06 DIAGNOSIS — N186 End stage renal disease: Secondary | ICD-10-CM | POA: Diagnosis not present

## 2018-06-06 DIAGNOSIS — N2581 Secondary hyperparathyroidism of renal origin: Secondary | ICD-10-CM | POA: Diagnosis not present

## 2018-06-06 DIAGNOSIS — Z283 Underimmunization status: Secondary | ICD-10-CM | POA: Diagnosis not present

## 2018-06-06 DIAGNOSIS — D631 Anemia in chronic kidney disease: Secondary | ICD-10-CM | POA: Diagnosis not present

## 2018-06-08 DIAGNOSIS — N186 End stage renal disease: Secondary | ICD-10-CM | POA: Diagnosis not present

## 2018-06-08 DIAGNOSIS — Z992 Dependence on renal dialysis: Secondary | ICD-10-CM | POA: Diagnosis not present

## 2018-06-08 DIAGNOSIS — N2581 Secondary hyperparathyroidism of renal origin: Secondary | ICD-10-CM | POA: Diagnosis not present

## 2018-06-08 DIAGNOSIS — Z23 Encounter for immunization: Secondary | ICD-10-CM | POA: Diagnosis not present

## 2018-06-08 DIAGNOSIS — D631 Anemia in chronic kidney disease: Secondary | ICD-10-CM | POA: Diagnosis not present

## 2018-06-08 DIAGNOSIS — Z283 Underimmunization status: Secondary | ICD-10-CM | POA: Diagnosis not present

## 2018-06-10 DIAGNOSIS — N2581 Secondary hyperparathyroidism of renal origin: Secondary | ICD-10-CM | POA: Diagnosis not present

## 2018-06-10 DIAGNOSIS — Z23 Encounter for immunization: Secondary | ICD-10-CM | POA: Diagnosis not present

## 2018-06-10 DIAGNOSIS — N186 End stage renal disease: Secondary | ICD-10-CM | POA: Diagnosis not present

## 2018-06-10 DIAGNOSIS — Z992 Dependence on renal dialysis: Secondary | ICD-10-CM | POA: Diagnosis not present

## 2018-06-10 DIAGNOSIS — Z283 Underimmunization status: Secondary | ICD-10-CM | POA: Diagnosis not present

## 2018-06-10 DIAGNOSIS — D631 Anemia in chronic kidney disease: Secondary | ICD-10-CM | POA: Diagnosis not present

## 2018-06-13 DIAGNOSIS — D631 Anemia in chronic kidney disease: Secondary | ICD-10-CM | POA: Diagnosis not present

## 2018-06-13 DIAGNOSIS — Z283 Underimmunization status: Secondary | ICD-10-CM | POA: Diagnosis not present

## 2018-06-13 DIAGNOSIS — N186 End stage renal disease: Secondary | ICD-10-CM | POA: Diagnosis not present

## 2018-06-13 DIAGNOSIS — N2581 Secondary hyperparathyroidism of renal origin: Secondary | ICD-10-CM | POA: Diagnosis not present

## 2018-06-13 DIAGNOSIS — Z992 Dependence on renal dialysis: Secondary | ICD-10-CM | POA: Diagnosis not present

## 2018-06-13 DIAGNOSIS — Z23 Encounter for immunization: Secondary | ICD-10-CM | POA: Diagnosis not present

## 2018-06-14 DIAGNOSIS — Z992 Dependence on renal dialysis: Secondary | ICD-10-CM | POA: Diagnosis not present

## 2018-06-14 DIAGNOSIS — I129 Hypertensive chronic kidney disease with stage 1 through stage 4 chronic kidney disease, or unspecified chronic kidney disease: Secondary | ICD-10-CM | POA: Diagnosis not present

## 2018-06-14 DIAGNOSIS — N186 End stage renal disease: Secondary | ICD-10-CM | POA: Diagnosis not present

## 2018-06-15 DIAGNOSIS — N2581 Secondary hyperparathyroidism of renal origin: Secondary | ICD-10-CM | POA: Diagnosis not present

## 2018-06-15 DIAGNOSIS — D631 Anemia in chronic kidney disease: Secondary | ICD-10-CM | POA: Diagnosis not present

## 2018-06-15 DIAGNOSIS — N186 End stage renal disease: Secondary | ICD-10-CM | POA: Diagnosis not present

## 2018-06-15 DIAGNOSIS — Z992 Dependence on renal dialysis: Secondary | ICD-10-CM | POA: Diagnosis not present

## 2018-06-15 DIAGNOSIS — D509 Iron deficiency anemia, unspecified: Secondary | ICD-10-CM | POA: Diagnosis not present

## 2018-06-17 DIAGNOSIS — Z992 Dependence on renal dialysis: Secondary | ICD-10-CM | POA: Diagnosis not present

## 2018-06-17 DIAGNOSIS — D509 Iron deficiency anemia, unspecified: Secondary | ICD-10-CM | POA: Diagnosis not present

## 2018-06-17 DIAGNOSIS — N186 End stage renal disease: Secondary | ICD-10-CM | POA: Diagnosis not present

## 2018-06-17 DIAGNOSIS — N2581 Secondary hyperparathyroidism of renal origin: Secondary | ICD-10-CM | POA: Diagnosis not present

## 2018-06-17 DIAGNOSIS — D631 Anemia in chronic kidney disease: Secondary | ICD-10-CM | POA: Diagnosis not present

## 2018-06-20 DIAGNOSIS — Z992 Dependence on renal dialysis: Secondary | ICD-10-CM | POA: Diagnosis not present

## 2018-06-20 DIAGNOSIS — N2581 Secondary hyperparathyroidism of renal origin: Secondary | ICD-10-CM | POA: Diagnosis not present

## 2018-06-20 DIAGNOSIS — D509 Iron deficiency anemia, unspecified: Secondary | ICD-10-CM | POA: Diagnosis not present

## 2018-06-20 DIAGNOSIS — N186 End stage renal disease: Secondary | ICD-10-CM | POA: Diagnosis not present

## 2018-06-20 DIAGNOSIS — D631 Anemia in chronic kidney disease: Secondary | ICD-10-CM | POA: Diagnosis not present

## 2018-06-22 DIAGNOSIS — D631 Anemia in chronic kidney disease: Secondary | ICD-10-CM | POA: Diagnosis not present

## 2018-06-22 DIAGNOSIS — N186 End stage renal disease: Secondary | ICD-10-CM | POA: Diagnosis not present

## 2018-06-22 DIAGNOSIS — D509 Iron deficiency anemia, unspecified: Secondary | ICD-10-CM | POA: Diagnosis not present

## 2018-06-22 DIAGNOSIS — Z992 Dependence on renal dialysis: Secondary | ICD-10-CM | POA: Diagnosis not present

## 2018-06-22 DIAGNOSIS — N2581 Secondary hyperparathyroidism of renal origin: Secondary | ICD-10-CM | POA: Diagnosis not present

## 2018-06-24 DIAGNOSIS — D509 Iron deficiency anemia, unspecified: Secondary | ICD-10-CM | POA: Diagnosis not present

## 2018-06-24 DIAGNOSIS — N2581 Secondary hyperparathyroidism of renal origin: Secondary | ICD-10-CM | POA: Diagnosis not present

## 2018-06-24 DIAGNOSIS — Z992 Dependence on renal dialysis: Secondary | ICD-10-CM | POA: Diagnosis not present

## 2018-06-24 DIAGNOSIS — D631 Anemia in chronic kidney disease: Secondary | ICD-10-CM | POA: Diagnosis not present

## 2018-06-24 DIAGNOSIS — N186 End stage renal disease: Secondary | ICD-10-CM | POA: Diagnosis not present

## 2018-06-27 DIAGNOSIS — N2581 Secondary hyperparathyroidism of renal origin: Secondary | ICD-10-CM | POA: Diagnosis not present

## 2018-06-27 DIAGNOSIS — D631 Anemia in chronic kidney disease: Secondary | ICD-10-CM | POA: Diagnosis not present

## 2018-06-27 DIAGNOSIS — Z992 Dependence on renal dialysis: Secondary | ICD-10-CM | POA: Diagnosis not present

## 2018-06-27 DIAGNOSIS — N186 End stage renal disease: Secondary | ICD-10-CM | POA: Diagnosis not present

## 2018-06-27 DIAGNOSIS — D509 Iron deficiency anemia, unspecified: Secondary | ICD-10-CM | POA: Diagnosis not present

## 2018-06-28 ENCOUNTER — Encounter (HOSPITAL_COMMUNITY): Payer: Medicare Other

## 2018-06-28 ENCOUNTER — Ambulatory Visit: Payer: Medicare Other | Admitting: Vascular Surgery

## 2018-06-29 DIAGNOSIS — Z992 Dependence on renal dialysis: Secondary | ICD-10-CM | POA: Diagnosis not present

## 2018-06-29 DIAGNOSIS — N2581 Secondary hyperparathyroidism of renal origin: Secondary | ICD-10-CM | POA: Diagnosis not present

## 2018-06-29 DIAGNOSIS — N186 End stage renal disease: Secondary | ICD-10-CM | POA: Diagnosis not present

## 2018-06-29 DIAGNOSIS — D509 Iron deficiency anemia, unspecified: Secondary | ICD-10-CM | POA: Diagnosis not present

## 2018-06-29 DIAGNOSIS — D631 Anemia in chronic kidney disease: Secondary | ICD-10-CM | POA: Diagnosis not present

## 2018-06-30 DIAGNOSIS — N186 End stage renal disease: Secondary | ICD-10-CM | POA: Diagnosis not present

## 2018-06-30 DIAGNOSIS — Z992 Dependence on renal dialysis: Secondary | ICD-10-CM | POA: Diagnosis not present

## 2018-06-30 DIAGNOSIS — I871 Compression of vein: Secondary | ICD-10-CM | POA: Diagnosis not present

## 2018-06-30 DIAGNOSIS — T82858A Stenosis of vascular prosthetic devices, implants and grafts, initial encounter: Secondary | ICD-10-CM | POA: Diagnosis not present

## 2018-07-01 DIAGNOSIS — N2581 Secondary hyperparathyroidism of renal origin: Secondary | ICD-10-CM | POA: Diagnosis not present

## 2018-07-01 DIAGNOSIS — D631 Anemia in chronic kidney disease: Secondary | ICD-10-CM | POA: Diagnosis not present

## 2018-07-01 DIAGNOSIS — N186 End stage renal disease: Secondary | ICD-10-CM | POA: Diagnosis not present

## 2018-07-01 DIAGNOSIS — Z992 Dependence on renal dialysis: Secondary | ICD-10-CM | POA: Diagnosis not present

## 2018-07-01 DIAGNOSIS — D509 Iron deficiency anemia, unspecified: Secondary | ICD-10-CM | POA: Diagnosis not present

## 2018-07-04 DIAGNOSIS — D631 Anemia in chronic kidney disease: Secondary | ICD-10-CM | POA: Diagnosis not present

## 2018-07-04 DIAGNOSIS — Z992 Dependence on renal dialysis: Secondary | ICD-10-CM | POA: Diagnosis not present

## 2018-07-04 DIAGNOSIS — D509 Iron deficiency anemia, unspecified: Secondary | ICD-10-CM | POA: Diagnosis not present

## 2018-07-04 DIAGNOSIS — N186 End stage renal disease: Secondary | ICD-10-CM | POA: Diagnosis not present

## 2018-07-04 DIAGNOSIS — N2581 Secondary hyperparathyroidism of renal origin: Secondary | ICD-10-CM | POA: Diagnosis not present

## 2018-07-06 DIAGNOSIS — D631 Anemia in chronic kidney disease: Secondary | ICD-10-CM | POA: Diagnosis not present

## 2018-07-06 DIAGNOSIS — N186 End stage renal disease: Secondary | ICD-10-CM | POA: Diagnosis not present

## 2018-07-06 DIAGNOSIS — N2581 Secondary hyperparathyroidism of renal origin: Secondary | ICD-10-CM | POA: Diagnosis not present

## 2018-07-06 DIAGNOSIS — D509 Iron deficiency anemia, unspecified: Secondary | ICD-10-CM | POA: Diagnosis not present

## 2018-07-06 DIAGNOSIS — Z992 Dependence on renal dialysis: Secondary | ICD-10-CM | POA: Diagnosis not present

## 2018-07-08 DIAGNOSIS — D631 Anemia in chronic kidney disease: Secondary | ICD-10-CM | POA: Diagnosis not present

## 2018-07-08 DIAGNOSIS — N186 End stage renal disease: Secondary | ICD-10-CM | POA: Diagnosis not present

## 2018-07-08 DIAGNOSIS — D509 Iron deficiency anemia, unspecified: Secondary | ICD-10-CM | POA: Diagnosis not present

## 2018-07-08 DIAGNOSIS — N2581 Secondary hyperparathyroidism of renal origin: Secondary | ICD-10-CM | POA: Diagnosis not present

## 2018-07-08 DIAGNOSIS — Z992 Dependence on renal dialysis: Secondary | ICD-10-CM | POA: Diagnosis not present

## 2018-07-11 DIAGNOSIS — N186 End stage renal disease: Secondary | ICD-10-CM | POA: Diagnosis not present

## 2018-07-11 DIAGNOSIS — Z992 Dependence on renal dialysis: Secondary | ICD-10-CM | POA: Diagnosis not present

## 2018-07-11 DIAGNOSIS — D509 Iron deficiency anemia, unspecified: Secondary | ICD-10-CM | POA: Diagnosis not present

## 2018-07-11 DIAGNOSIS — N2581 Secondary hyperparathyroidism of renal origin: Secondary | ICD-10-CM | POA: Diagnosis not present

## 2018-07-11 DIAGNOSIS — D631 Anemia in chronic kidney disease: Secondary | ICD-10-CM | POA: Diagnosis not present

## 2018-07-13 DIAGNOSIS — D631 Anemia in chronic kidney disease: Secondary | ICD-10-CM | POA: Diagnosis not present

## 2018-07-13 DIAGNOSIS — Z992 Dependence on renal dialysis: Secondary | ICD-10-CM | POA: Diagnosis not present

## 2018-07-13 DIAGNOSIS — N186 End stage renal disease: Secondary | ICD-10-CM | POA: Diagnosis not present

## 2018-07-13 DIAGNOSIS — N2581 Secondary hyperparathyroidism of renal origin: Secondary | ICD-10-CM | POA: Diagnosis not present

## 2018-07-13 DIAGNOSIS — D509 Iron deficiency anemia, unspecified: Secondary | ICD-10-CM | POA: Diagnosis not present

## 2018-07-15 DIAGNOSIS — N186 End stage renal disease: Secondary | ICD-10-CM | POA: Diagnosis not present

## 2018-07-15 DIAGNOSIS — D631 Anemia in chronic kidney disease: Secondary | ICD-10-CM | POA: Diagnosis not present

## 2018-07-15 DIAGNOSIS — N2581 Secondary hyperparathyroidism of renal origin: Secondary | ICD-10-CM | POA: Diagnosis not present

## 2018-07-15 DIAGNOSIS — Z992 Dependence on renal dialysis: Secondary | ICD-10-CM | POA: Diagnosis not present

## 2018-07-15 DIAGNOSIS — I129 Hypertensive chronic kidney disease with stage 1 through stage 4 chronic kidney disease, or unspecified chronic kidney disease: Secondary | ICD-10-CM | POA: Diagnosis not present

## 2018-07-15 DIAGNOSIS — D509 Iron deficiency anemia, unspecified: Secondary | ICD-10-CM | POA: Diagnosis not present

## 2018-07-18 DIAGNOSIS — N2581 Secondary hyperparathyroidism of renal origin: Secondary | ICD-10-CM | POA: Diagnosis not present

## 2018-07-18 DIAGNOSIS — N186 End stage renal disease: Secondary | ICD-10-CM | POA: Diagnosis not present

## 2018-07-18 DIAGNOSIS — D509 Iron deficiency anemia, unspecified: Secondary | ICD-10-CM | POA: Diagnosis not present

## 2018-07-18 DIAGNOSIS — Z992 Dependence on renal dialysis: Secondary | ICD-10-CM | POA: Diagnosis not present

## 2018-07-18 DIAGNOSIS — D631 Anemia in chronic kidney disease: Secondary | ICD-10-CM | POA: Diagnosis not present

## 2018-07-19 ENCOUNTER — Encounter (HOSPITAL_COMMUNITY): Payer: Medicare Other

## 2018-07-19 ENCOUNTER — Ambulatory Visit: Payer: Medicare Other | Admitting: Vascular Surgery

## 2018-07-20 DIAGNOSIS — N186 End stage renal disease: Secondary | ICD-10-CM | POA: Diagnosis not present

## 2018-07-20 DIAGNOSIS — D509 Iron deficiency anemia, unspecified: Secondary | ICD-10-CM | POA: Diagnosis not present

## 2018-07-20 DIAGNOSIS — Z992 Dependence on renal dialysis: Secondary | ICD-10-CM | POA: Diagnosis not present

## 2018-07-20 DIAGNOSIS — N2581 Secondary hyperparathyroidism of renal origin: Secondary | ICD-10-CM | POA: Diagnosis not present

## 2018-07-20 DIAGNOSIS — D631 Anemia in chronic kidney disease: Secondary | ICD-10-CM | POA: Diagnosis not present

## 2018-07-22 DIAGNOSIS — N2581 Secondary hyperparathyroidism of renal origin: Secondary | ICD-10-CM | POA: Diagnosis not present

## 2018-07-22 DIAGNOSIS — D509 Iron deficiency anemia, unspecified: Secondary | ICD-10-CM | POA: Diagnosis not present

## 2018-07-22 DIAGNOSIS — D631 Anemia in chronic kidney disease: Secondary | ICD-10-CM | POA: Diagnosis not present

## 2018-07-22 DIAGNOSIS — N186 End stage renal disease: Secondary | ICD-10-CM | POA: Diagnosis not present

## 2018-07-22 DIAGNOSIS — Z992 Dependence on renal dialysis: Secondary | ICD-10-CM | POA: Diagnosis not present

## 2018-07-25 DIAGNOSIS — Z992 Dependence on renal dialysis: Secondary | ICD-10-CM | POA: Diagnosis not present

## 2018-07-25 DIAGNOSIS — N2581 Secondary hyperparathyroidism of renal origin: Secondary | ICD-10-CM | POA: Diagnosis not present

## 2018-07-25 DIAGNOSIS — D631 Anemia in chronic kidney disease: Secondary | ICD-10-CM | POA: Diagnosis not present

## 2018-07-25 DIAGNOSIS — N186 End stage renal disease: Secondary | ICD-10-CM | POA: Diagnosis not present

## 2018-07-25 DIAGNOSIS — D509 Iron deficiency anemia, unspecified: Secondary | ICD-10-CM | POA: Diagnosis not present

## 2018-07-27 DIAGNOSIS — N2581 Secondary hyperparathyroidism of renal origin: Secondary | ICD-10-CM | POA: Diagnosis not present

## 2018-07-27 DIAGNOSIS — N186 End stage renal disease: Secondary | ICD-10-CM | POA: Diagnosis not present

## 2018-07-27 DIAGNOSIS — D509 Iron deficiency anemia, unspecified: Secondary | ICD-10-CM | POA: Diagnosis not present

## 2018-07-27 DIAGNOSIS — Z992 Dependence on renal dialysis: Secondary | ICD-10-CM | POA: Diagnosis not present

## 2018-07-27 DIAGNOSIS — D631 Anemia in chronic kidney disease: Secondary | ICD-10-CM | POA: Diagnosis not present

## 2018-07-29 DIAGNOSIS — D631 Anemia in chronic kidney disease: Secondary | ICD-10-CM | POA: Diagnosis not present

## 2018-07-29 DIAGNOSIS — D509 Iron deficiency anemia, unspecified: Secondary | ICD-10-CM | POA: Diagnosis not present

## 2018-07-29 DIAGNOSIS — N2581 Secondary hyperparathyroidism of renal origin: Secondary | ICD-10-CM | POA: Diagnosis not present

## 2018-07-29 DIAGNOSIS — Z992 Dependence on renal dialysis: Secondary | ICD-10-CM | POA: Diagnosis not present

## 2018-07-29 DIAGNOSIS — N186 End stage renal disease: Secondary | ICD-10-CM | POA: Diagnosis not present

## 2018-08-01 DIAGNOSIS — Z992 Dependence on renal dialysis: Secondary | ICD-10-CM | POA: Diagnosis not present

## 2018-08-01 DIAGNOSIS — N186 End stage renal disease: Secondary | ICD-10-CM | POA: Diagnosis not present

## 2018-08-01 DIAGNOSIS — D631 Anemia in chronic kidney disease: Secondary | ICD-10-CM | POA: Diagnosis not present

## 2018-08-01 DIAGNOSIS — N2581 Secondary hyperparathyroidism of renal origin: Secondary | ICD-10-CM | POA: Diagnosis not present

## 2018-08-01 DIAGNOSIS — D509 Iron deficiency anemia, unspecified: Secondary | ICD-10-CM | POA: Diagnosis not present

## 2018-08-03 DIAGNOSIS — N186 End stage renal disease: Secondary | ICD-10-CM | POA: Diagnosis not present

## 2018-08-03 DIAGNOSIS — D509 Iron deficiency anemia, unspecified: Secondary | ICD-10-CM | POA: Diagnosis not present

## 2018-08-03 DIAGNOSIS — D631 Anemia in chronic kidney disease: Secondary | ICD-10-CM | POA: Diagnosis not present

## 2018-08-03 DIAGNOSIS — Z992 Dependence on renal dialysis: Secondary | ICD-10-CM | POA: Diagnosis not present

## 2018-08-03 DIAGNOSIS — N2581 Secondary hyperparathyroidism of renal origin: Secondary | ICD-10-CM | POA: Diagnosis not present

## 2018-08-05 DIAGNOSIS — N2581 Secondary hyperparathyroidism of renal origin: Secondary | ICD-10-CM | POA: Diagnosis not present

## 2018-08-05 DIAGNOSIS — N186 End stage renal disease: Secondary | ICD-10-CM | POA: Diagnosis not present

## 2018-08-05 DIAGNOSIS — Z992 Dependence on renal dialysis: Secondary | ICD-10-CM | POA: Diagnosis not present

## 2018-08-05 DIAGNOSIS — D631 Anemia in chronic kidney disease: Secondary | ICD-10-CM | POA: Diagnosis not present

## 2018-08-05 DIAGNOSIS — D509 Iron deficiency anemia, unspecified: Secondary | ICD-10-CM | POA: Diagnosis not present

## 2018-08-07 DIAGNOSIS — N186 End stage renal disease: Secondary | ICD-10-CM | POA: Diagnosis not present

## 2018-08-07 DIAGNOSIS — D631 Anemia in chronic kidney disease: Secondary | ICD-10-CM | POA: Diagnosis not present

## 2018-08-07 DIAGNOSIS — N2581 Secondary hyperparathyroidism of renal origin: Secondary | ICD-10-CM | POA: Diagnosis not present

## 2018-08-07 DIAGNOSIS — D509 Iron deficiency anemia, unspecified: Secondary | ICD-10-CM | POA: Diagnosis not present

## 2018-08-07 DIAGNOSIS — Z992 Dependence on renal dialysis: Secondary | ICD-10-CM | POA: Diagnosis not present

## 2018-08-09 DIAGNOSIS — N2581 Secondary hyperparathyroidism of renal origin: Secondary | ICD-10-CM | POA: Diagnosis not present

## 2018-08-09 DIAGNOSIS — N186 End stage renal disease: Secondary | ICD-10-CM | POA: Diagnosis not present

## 2018-08-09 DIAGNOSIS — Z992 Dependence on renal dialysis: Secondary | ICD-10-CM | POA: Diagnosis not present

## 2018-08-09 DIAGNOSIS — D631 Anemia in chronic kidney disease: Secondary | ICD-10-CM | POA: Diagnosis not present

## 2018-08-09 DIAGNOSIS — D509 Iron deficiency anemia, unspecified: Secondary | ICD-10-CM | POA: Diagnosis not present

## 2018-08-12 DIAGNOSIS — N2581 Secondary hyperparathyroidism of renal origin: Secondary | ICD-10-CM | POA: Diagnosis not present

## 2018-08-12 DIAGNOSIS — N186 End stage renal disease: Secondary | ICD-10-CM | POA: Diagnosis not present

## 2018-08-12 DIAGNOSIS — D631 Anemia in chronic kidney disease: Secondary | ICD-10-CM | POA: Diagnosis not present

## 2018-08-12 DIAGNOSIS — Z992 Dependence on renal dialysis: Secondary | ICD-10-CM | POA: Diagnosis not present

## 2018-08-12 DIAGNOSIS — D509 Iron deficiency anemia, unspecified: Secondary | ICD-10-CM | POA: Diagnosis not present

## 2018-08-14 DIAGNOSIS — Z992 Dependence on renal dialysis: Secondary | ICD-10-CM | POA: Diagnosis not present

## 2018-08-14 DIAGNOSIS — N186 End stage renal disease: Secondary | ICD-10-CM | POA: Diagnosis not present

## 2018-08-14 DIAGNOSIS — I129 Hypertensive chronic kidney disease with stage 1 through stage 4 chronic kidney disease, or unspecified chronic kidney disease: Secondary | ICD-10-CM | POA: Diagnosis not present

## 2018-08-15 DIAGNOSIS — Z992 Dependence on renal dialysis: Secondary | ICD-10-CM | POA: Diagnosis not present

## 2018-08-15 DIAGNOSIS — D509 Iron deficiency anemia, unspecified: Secondary | ICD-10-CM | POA: Diagnosis not present

## 2018-08-15 DIAGNOSIS — N186 End stage renal disease: Secondary | ICD-10-CM | POA: Diagnosis not present

## 2018-08-15 DIAGNOSIS — N2581 Secondary hyperparathyroidism of renal origin: Secondary | ICD-10-CM | POA: Diagnosis not present

## 2018-08-15 DIAGNOSIS — D631 Anemia in chronic kidney disease: Secondary | ICD-10-CM | POA: Diagnosis not present

## 2018-08-17 DIAGNOSIS — D509 Iron deficiency anemia, unspecified: Secondary | ICD-10-CM | POA: Diagnosis not present

## 2018-08-17 DIAGNOSIS — D631 Anemia in chronic kidney disease: Secondary | ICD-10-CM | POA: Diagnosis not present

## 2018-08-17 DIAGNOSIS — N186 End stage renal disease: Secondary | ICD-10-CM | POA: Diagnosis not present

## 2018-08-17 DIAGNOSIS — N2581 Secondary hyperparathyroidism of renal origin: Secondary | ICD-10-CM | POA: Diagnosis not present

## 2018-08-17 DIAGNOSIS — Z992 Dependence on renal dialysis: Secondary | ICD-10-CM | POA: Diagnosis not present

## 2018-08-19 DIAGNOSIS — Z992 Dependence on renal dialysis: Secondary | ICD-10-CM | POA: Diagnosis not present

## 2018-08-19 DIAGNOSIS — D509 Iron deficiency anemia, unspecified: Secondary | ICD-10-CM | POA: Diagnosis not present

## 2018-08-19 DIAGNOSIS — N186 End stage renal disease: Secondary | ICD-10-CM | POA: Diagnosis not present

## 2018-08-19 DIAGNOSIS — D631 Anemia in chronic kidney disease: Secondary | ICD-10-CM | POA: Diagnosis not present

## 2018-08-19 DIAGNOSIS — N2581 Secondary hyperparathyroidism of renal origin: Secondary | ICD-10-CM | POA: Diagnosis not present

## 2018-08-22 DIAGNOSIS — N186 End stage renal disease: Secondary | ICD-10-CM | POA: Diagnosis not present

## 2018-08-22 DIAGNOSIS — N2581 Secondary hyperparathyroidism of renal origin: Secondary | ICD-10-CM | POA: Diagnosis not present

## 2018-08-22 DIAGNOSIS — D509 Iron deficiency anemia, unspecified: Secondary | ICD-10-CM | POA: Diagnosis not present

## 2018-08-22 DIAGNOSIS — Z992 Dependence on renal dialysis: Secondary | ICD-10-CM | POA: Diagnosis not present

## 2018-08-22 DIAGNOSIS — D631 Anemia in chronic kidney disease: Secondary | ICD-10-CM | POA: Diagnosis not present

## 2018-08-24 DIAGNOSIS — N2581 Secondary hyperparathyroidism of renal origin: Secondary | ICD-10-CM | POA: Diagnosis not present

## 2018-08-24 DIAGNOSIS — D509 Iron deficiency anemia, unspecified: Secondary | ICD-10-CM | POA: Diagnosis not present

## 2018-08-24 DIAGNOSIS — N186 End stage renal disease: Secondary | ICD-10-CM | POA: Diagnosis not present

## 2018-08-24 DIAGNOSIS — D631 Anemia in chronic kidney disease: Secondary | ICD-10-CM | POA: Diagnosis not present

## 2018-08-24 DIAGNOSIS — Z992 Dependence on renal dialysis: Secondary | ICD-10-CM | POA: Diagnosis not present

## 2018-08-26 DIAGNOSIS — D631 Anemia in chronic kidney disease: Secondary | ICD-10-CM | POA: Diagnosis not present

## 2018-08-26 DIAGNOSIS — N2581 Secondary hyperparathyroidism of renal origin: Secondary | ICD-10-CM | POA: Diagnosis not present

## 2018-08-26 DIAGNOSIS — D509 Iron deficiency anemia, unspecified: Secondary | ICD-10-CM | POA: Diagnosis not present

## 2018-08-26 DIAGNOSIS — Z992 Dependence on renal dialysis: Secondary | ICD-10-CM | POA: Diagnosis not present

## 2018-08-26 DIAGNOSIS — N186 End stage renal disease: Secondary | ICD-10-CM | POA: Diagnosis not present

## 2018-08-29 DIAGNOSIS — D631 Anemia in chronic kidney disease: Secondary | ICD-10-CM | POA: Diagnosis not present

## 2018-08-29 DIAGNOSIS — Z992 Dependence on renal dialysis: Secondary | ICD-10-CM | POA: Diagnosis not present

## 2018-08-29 DIAGNOSIS — N186 End stage renal disease: Secondary | ICD-10-CM | POA: Diagnosis not present

## 2018-08-29 DIAGNOSIS — N2581 Secondary hyperparathyroidism of renal origin: Secondary | ICD-10-CM | POA: Diagnosis not present

## 2018-08-29 DIAGNOSIS — D509 Iron deficiency anemia, unspecified: Secondary | ICD-10-CM | POA: Diagnosis not present

## 2018-08-31 DIAGNOSIS — Z992 Dependence on renal dialysis: Secondary | ICD-10-CM | POA: Diagnosis not present

## 2018-08-31 DIAGNOSIS — D631 Anemia in chronic kidney disease: Secondary | ICD-10-CM | POA: Diagnosis not present

## 2018-08-31 DIAGNOSIS — N186 End stage renal disease: Secondary | ICD-10-CM | POA: Diagnosis not present

## 2018-08-31 DIAGNOSIS — N2581 Secondary hyperparathyroidism of renal origin: Secondary | ICD-10-CM | POA: Diagnosis not present

## 2018-08-31 DIAGNOSIS — D509 Iron deficiency anemia, unspecified: Secondary | ICD-10-CM | POA: Diagnosis not present

## 2018-09-02 DIAGNOSIS — Z992 Dependence on renal dialysis: Secondary | ICD-10-CM | POA: Diagnosis not present

## 2018-09-02 DIAGNOSIS — N186 End stage renal disease: Secondary | ICD-10-CM | POA: Diagnosis not present

## 2018-09-02 DIAGNOSIS — D509 Iron deficiency anemia, unspecified: Secondary | ICD-10-CM | POA: Diagnosis not present

## 2018-09-02 DIAGNOSIS — D631 Anemia in chronic kidney disease: Secondary | ICD-10-CM | POA: Diagnosis not present

## 2018-09-02 DIAGNOSIS — N2581 Secondary hyperparathyroidism of renal origin: Secondary | ICD-10-CM | POA: Diagnosis not present

## 2018-09-04 DIAGNOSIS — D509 Iron deficiency anemia, unspecified: Secondary | ICD-10-CM | POA: Diagnosis not present

## 2018-09-04 DIAGNOSIS — D631 Anemia in chronic kidney disease: Secondary | ICD-10-CM | POA: Diagnosis not present

## 2018-09-04 DIAGNOSIS — N2581 Secondary hyperparathyroidism of renal origin: Secondary | ICD-10-CM | POA: Diagnosis not present

## 2018-09-04 DIAGNOSIS — N186 End stage renal disease: Secondary | ICD-10-CM | POA: Diagnosis not present

## 2018-09-04 DIAGNOSIS — Z992 Dependence on renal dialysis: Secondary | ICD-10-CM | POA: Diagnosis not present

## 2018-09-06 DIAGNOSIS — N186 End stage renal disease: Secondary | ICD-10-CM | POA: Diagnosis not present

## 2018-09-06 DIAGNOSIS — Z992 Dependence on renal dialysis: Secondary | ICD-10-CM | POA: Diagnosis not present

## 2018-09-06 DIAGNOSIS — D509 Iron deficiency anemia, unspecified: Secondary | ICD-10-CM | POA: Diagnosis not present

## 2018-09-06 DIAGNOSIS — N2581 Secondary hyperparathyroidism of renal origin: Secondary | ICD-10-CM | POA: Diagnosis not present

## 2018-09-06 DIAGNOSIS — D631 Anemia in chronic kidney disease: Secondary | ICD-10-CM | POA: Diagnosis not present

## 2018-09-09 DIAGNOSIS — N186 End stage renal disease: Secondary | ICD-10-CM | POA: Diagnosis not present

## 2018-09-09 DIAGNOSIS — D631 Anemia in chronic kidney disease: Secondary | ICD-10-CM | POA: Diagnosis not present

## 2018-09-09 DIAGNOSIS — Z992 Dependence on renal dialysis: Secondary | ICD-10-CM | POA: Diagnosis not present

## 2018-09-09 DIAGNOSIS — N2581 Secondary hyperparathyroidism of renal origin: Secondary | ICD-10-CM | POA: Diagnosis not present

## 2018-09-09 DIAGNOSIS — D509 Iron deficiency anemia, unspecified: Secondary | ICD-10-CM | POA: Diagnosis not present

## 2018-09-11 DIAGNOSIS — D509 Iron deficiency anemia, unspecified: Secondary | ICD-10-CM | POA: Diagnosis not present

## 2018-09-11 DIAGNOSIS — N2581 Secondary hyperparathyroidism of renal origin: Secondary | ICD-10-CM | POA: Diagnosis not present

## 2018-09-11 DIAGNOSIS — D631 Anemia in chronic kidney disease: Secondary | ICD-10-CM | POA: Diagnosis not present

## 2018-09-11 DIAGNOSIS — N186 End stage renal disease: Secondary | ICD-10-CM | POA: Diagnosis not present

## 2018-09-11 DIAGNOSIS — Z992 Dependence on renal dialysis: Secondary | ICD-10-CM | POA: Diagnosis not present

## 2018-09-13 DIAGNOSIS — D631 Anemia in chronic kidney disease: Secondary | ICD-10-CM | POA: Diagnosis not present

## 2018-09-13 DIAGNOSIS — D509 Iron deficiency anemia, unspecified: Secondary | ICD-10-CM | POA: Diagnosis not present

## 2018-09-13 DIAGNOSIS — N186 End stage renal disease: Secondary | ICD-10-CM | POA: Diagnosis not present

## 2018-09-13 DIAGNOSIS — N2581 Secondary hyperparathyroidism of renal origin: Secondary | ICD-10-CM | POA: Diagnosis not present

## 2018-09-13 DIAGNOSIS — Z992 Dependence on renal dialysis: Secondary | ICD-10-CM | POA: Diagnosis not present

## 2018-10-26 ENCOUNTER — Ambulatory Visit: Payer: Medicare Other | Admitting: Physician Assistant

## 2018-11-08 NOTE — Progress Notes (Deleted)
Cardiology Office Note    Date:  11/08/2018   ID:  Kal Chait, DOB 12/26/1960, MRN [id number]  PCP:  Delilah Shan, MD  Cardiologist: Larae Grooms, MD EPS: None  No chief complaint on file.   History of Present Illness:  Keylor Rands is a 58 y.o. male with history of ESRD on HD, HTN, morbid obesity previous dilated cardiomyopathy ejection fraction 30 to 35% in 2016 up to 50% 2018.  Initially saw Dr. Radford Pax in 2016 for evaluation of heart failure at which time primary cardiologist with Dr. Darral Dash.  Patient evaluated by Dr. Irish Lack 11/2017 for tachycardia and low blood pressure.  He did not recommend adding beta-blocker unless blood pressure increase.  Patient added onto my schedule to be considered for TTE to evaluate for endocarditis.  Patient has sepsis due to Streptococcus group A 10/11/2018    Past Medical History:  Diagnosis Date  . Anemia   . Arthritis   . CHF (congestive heart failure) ([place])   . Chronic kidney disease    stage 4  . Chronic systolic heart failure ([place]) 08/29/2015  . Hypertension   . Morbid obesity ([place])   . Rheumatoid arthritis ([place])   . Sleep apnea    wears CPAP    Past Surgical History:  Procedure Laterality Date  . AV FISTULA PLACEMENT Left 07/09/2016   Procedure: CREATION OF LEFT RADIOCEPHALIC ARTERIOVENOUS (AV) FISTULA;  Surgeon: Waynetta Sandy, MD;  Location: [facility];  Service: Vascular;  Laterality: Left;  . CARDIAC CATHETERIZATION     12/18/11: No sign CAD, elevated left heart pressures ([facility])  . KNEE SURGERY     torn ligaments    Current Medications: No outpatient medications have been marked as taking for the 11/15/18 encounter (Appointment) with Imogene Burn, PA-C.     Allergies:   Patient has no known allergies.   Social History   Socioeconomic History  . Marital status: Single    Spouse name: Not on file  . Number of children: Not on file  . Years of education: Not on file  . Highest  education level: Not on file  Occupational History  . Not on file  Social Needs  . Financial resource strain: Not on file  . Food insecurity:    Worry: Not on file    Inability: Not on file  . Transportation needs:    Medical: Not on file    Non-medical: Not on file  Tobacco Use  . Smoking status: Former Smoker    Last attempt to quit: 08/12/1995    Years since quitting: 23.2  . Smokeless tobacco: Never Used  Substance and Sexual Activity  . Alcohol use: No    Alcohol/week: 0.0 standard drinks  . Drug use: No  . Sexual activity: Not on file  Lifestyle  . Physical activity:    Days per week: Not on file    Minutes per session: Not on file  . Stress: Not on file  Relationships  . Social connections:    Talks on phone: Not on file    Gets together: Not on file    Attends religious service: Not on file    Active member of club or organization: Not on file    Attends meetings of clubs or organizations: Not on file    Relationship status: Not on file  Other Topics Concern  . Not on file  Social History Narrative  . Not on file     Family History:  The patient's ***family history includes Hypertension in his mother.   ROS:   Please see the history of present illness.    ROS All other systems reviewed and are negative.   PHYSICAL EXAM:   VS:  There were no vitals taken for this visit.  Physical Exam  GEN: Well nourished, well developed, in no acute distress  HEENT: normal  Neck: no JVD, carotid bruits, or masses Cardiac:RRR; no murmurs, rubs, or gallops  Respiratory:  clear to auscultation bilaterally, normal work of breathing GI: soft, nontender, nondistended, + BS Ext: without cyanosis, clubbing, or edema, Good distal pulses bilaterally MS: no deformity or atrophy  Skin: warm and dry, no rash Neuro:  Alert and Oriented x 3, Strength and sensation are intact Psych: euthymic mood, full affect  Wt Readings from Last 3 Encounters:  12/09/17 (!) 349 lb 12.8 oz  (158.7 kg)  07/05/17 (!) 344 lb (156 kg)  06/29/17 (!) 344 lb (156 kg)      Studies/Labs Reviewed:   EKG:  EKG is*** ordered today.  The ekg ordered today demonstrates ***  Recent Labs: No results found for requested labs within last 8760 hours.   Lipid Panel    Component Value Date/Time   CHOL 156 05/28/2017   TRIG 80 05/28/2017   HDL 67 05/28/2017   LDLCALC 73 05/28/2017    Additional studies/ records that were reviewed today include:   2D echo 8/31/2018Study Conclusions   - Left ventricle: Poor acoustic windows limit study, even with   Definity use. LVEF is approximately 50% This is imporoved from   report of 2016. The cavity size was normal. Wall thickness was   increased in a pattern of mild LVH.   2D echo 11/29/2016Study Conclusions   - Left ventricle: The cavity size was severely dilated. Wall   thickness was increased in a pattern of mild LVH. Systolic   function was moderately to severely reduced. The estimated   ejection fraction was in the range of 30% to 35%. Doppler   parameters are consistent with abnormal left ventricular   relaxation (grade 1 diastolic dysfunction). - Impressions: Extremely limited; definity used but LV function   still difficult to quantitate; moderate to severe global   reduction in LV function; grade 1 diastolic dysfunction; severe   LVE; mild LVH. Suggest cardiac MRI or MUGA to better assess LV   function.   Impressions:   - Extremely limited; definity used but LV function still difficult   to quantitate; moderate to severe global reduction in LV   function; grade 1 diastolic dysfunction; severe LVE; mild LVH.   Suggest cardiac MRI or MUGA to better assess LV function.      ASSESSMENT:    1. NICM (nonischemic cardiomyopathy) ([place])   2. Chronic combined systolic and diastolic heart failure ([place])   3. ESRD on hemodialysis ([place])      PLAN:  In order of problems listed above:  Question of endocarditis  History of  nonischemic cardiomyopathy ejection fraction improved on echo in 2018  Chronic combined systolic and diastolic CHF  ESRD on HD  Medication Adjustments/Labs and Tests Ordered: Current medicines are reviewed at length with the patient today.  Concerns regarding medicines are outlined above.  Medication changes, Labs and Tests ordered today are listed in the Patient Instructions below. There are no Patient Instructions on file for this visit.   Signed, Ermalinda Barrios, PA-C  11/08/2018 2:58 PM    [facility]  [address], [place], [place]  [address] Phone: [phone]; Fax: [phone]

## 2018-11-15 ENCOUNTER — Ambulatory Visit: Payer: Medicare Other | Admitting: Physician Assistant

## 2018-11-15 DIAGNOSIS — R0989 Other specified symptoms and signs involving the circulatory and respiratory systems: Secondary | ICD-10-CM

## 2018-11-16 ENCOUNTER — Encounter: Payer: Self-pay | Admitting: Physician Assistant

## 2019-02-04 IMAGING — DX DG CHEST 2V
2 series · 2 of 2 positions shown · non-contrast
Comparison: 08/17/2015

CLINICAL DATA: Right leg infection. Pain x 10 days.No chest pain or
SOB.Hx of CHF.

EXAM:
CHEST  2 VIEW

[w chest lat]
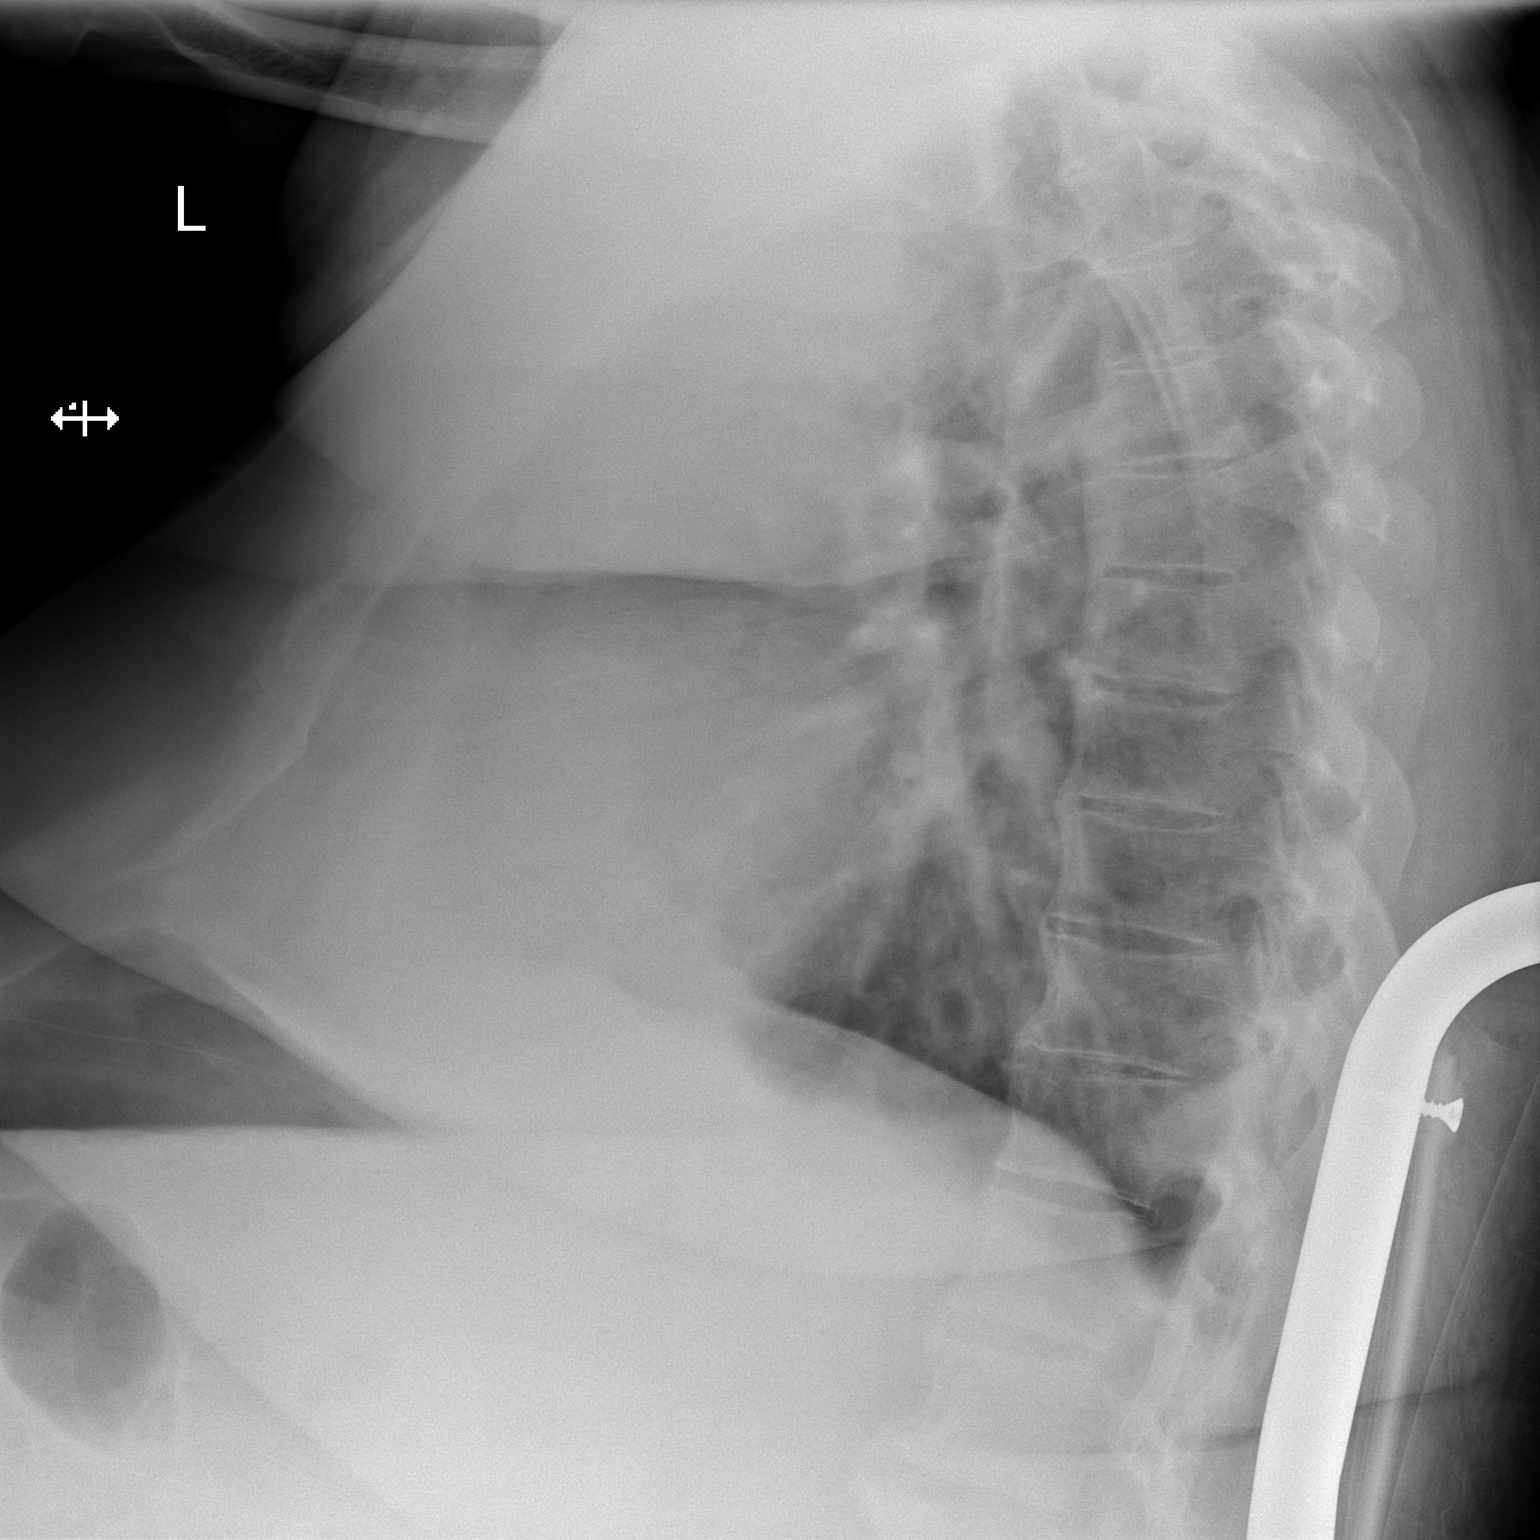

[x chest ap]
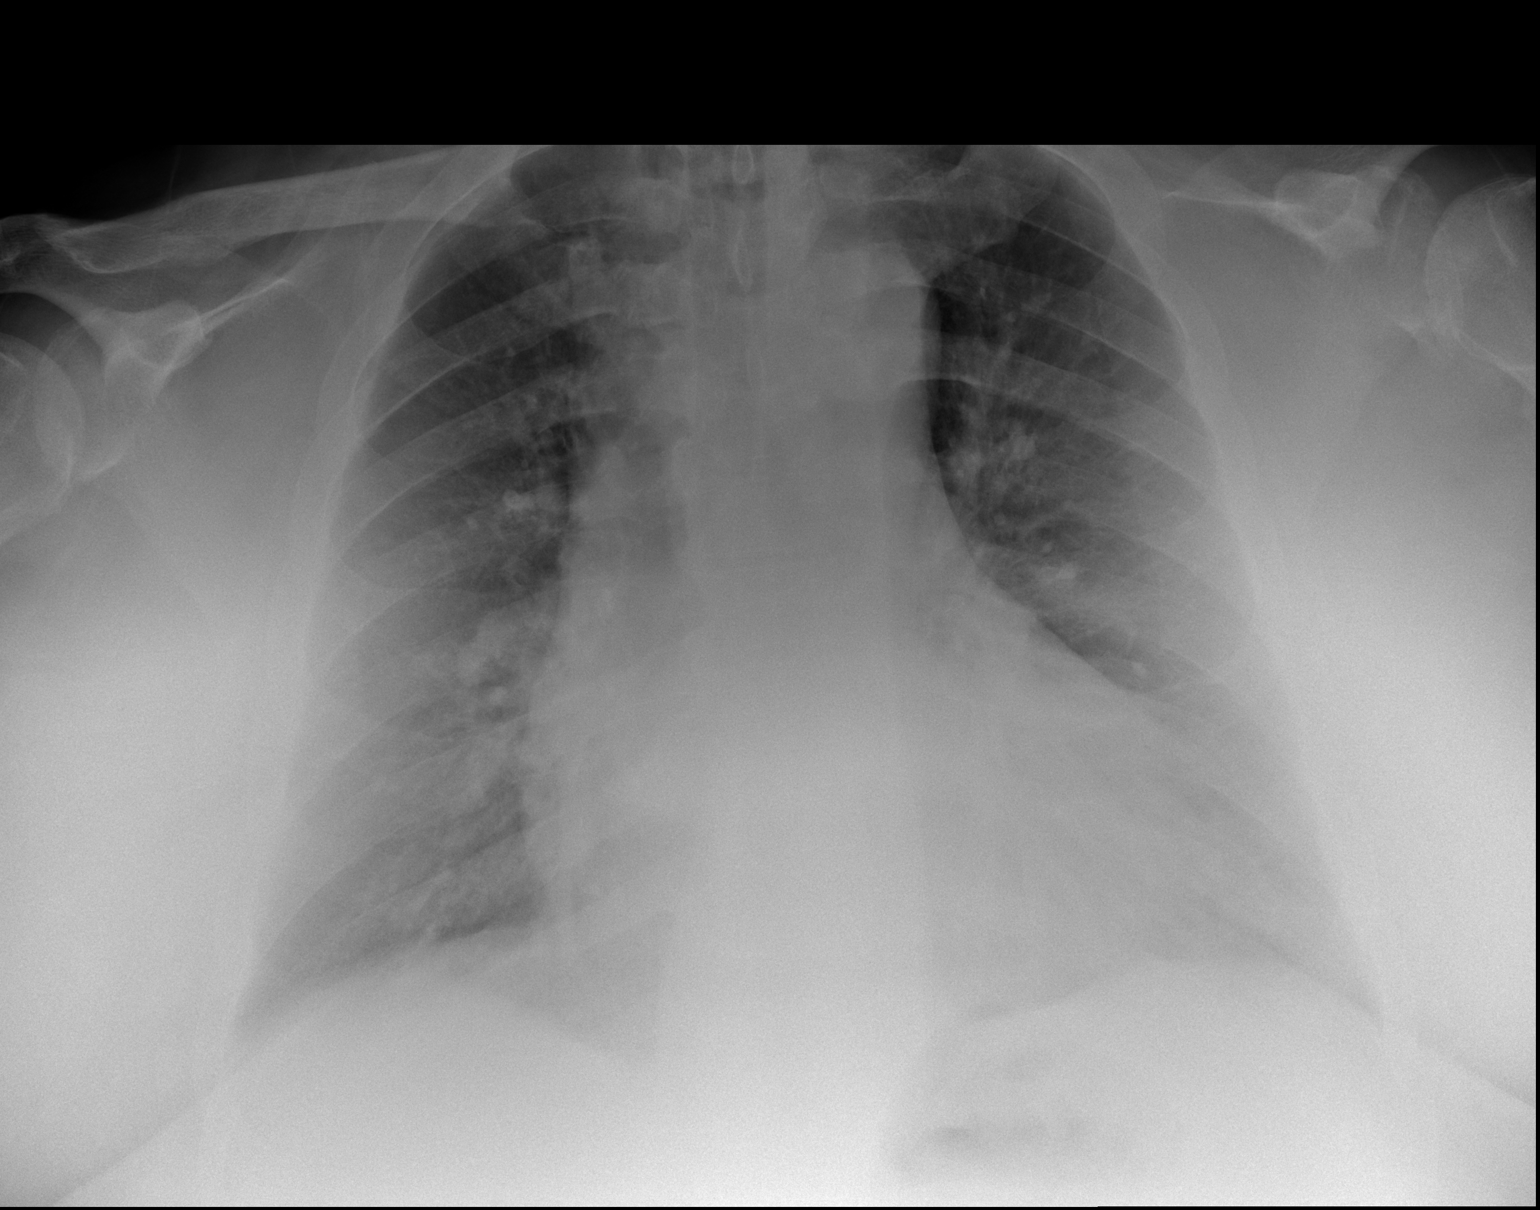

[2 of 2 positions shown; findings below may reference images not displayed]

FINDINGS: Borderline enlargement of the cardiac silhouette. No mediastinal or
hilar masses. No convincing adenopathy.

Lungs are clear.  No pleural effusion or pneumothorax.

Skeletal structures are intact.
IMPRESSION: No acute cardiopulmonary disease.

## 2019-02-13 IMAGING — CR DG FOOT 2V*R*
3 series · 3 of 3 positions shown · non-contrast
Comparison: None.

CLINICAL DATA: Diffuse right foot pain.  Right foot swelling.

EXAM:
RIGHT FOOT - 2 VIEW

[AP]
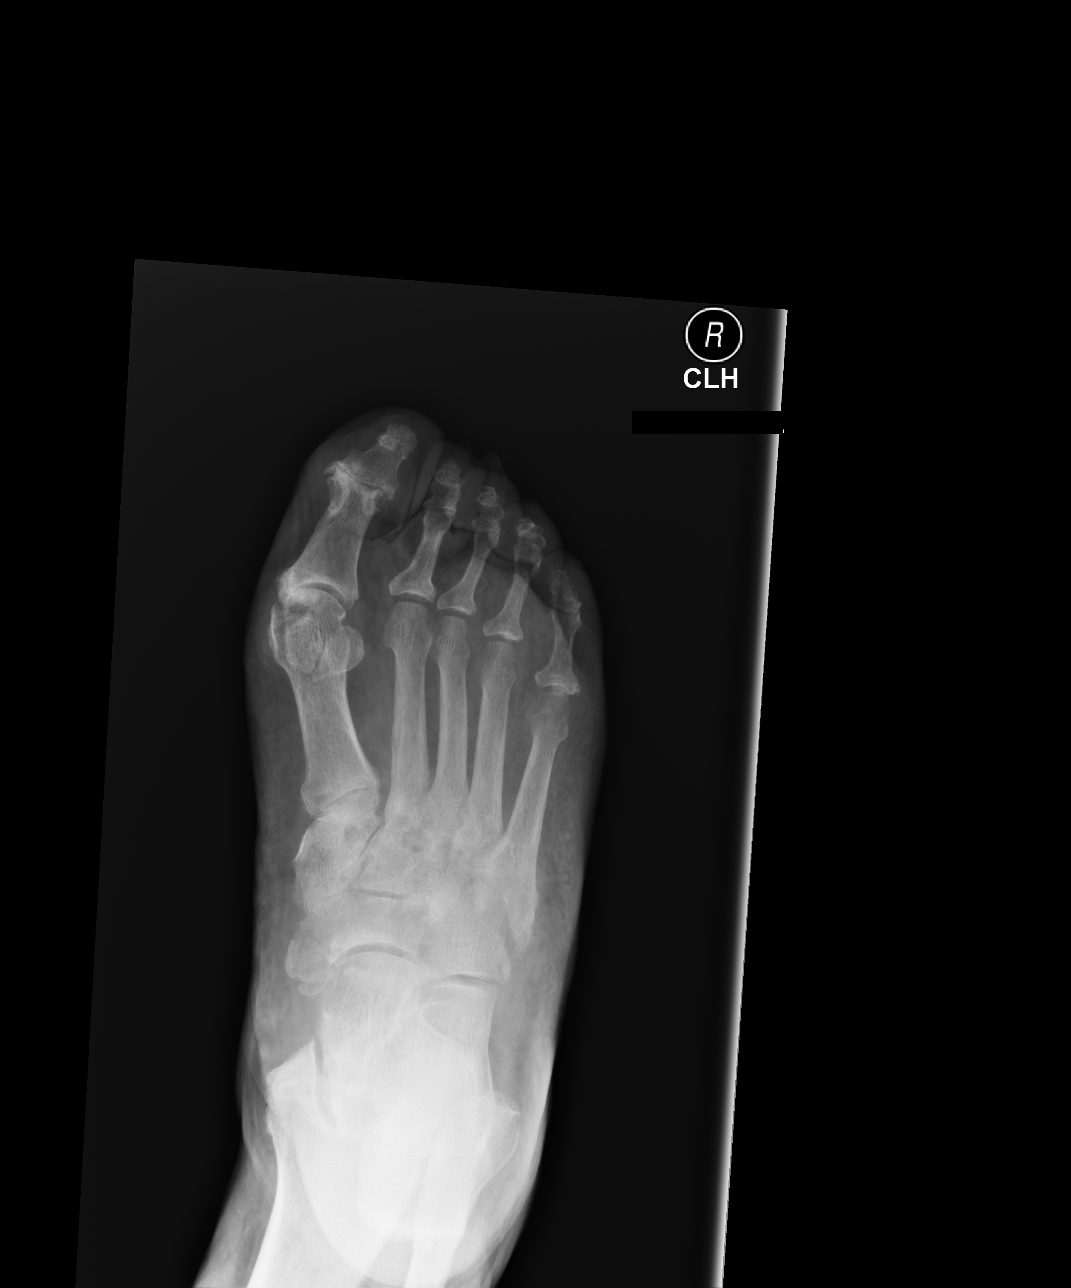

[lateral (1 of 2)]
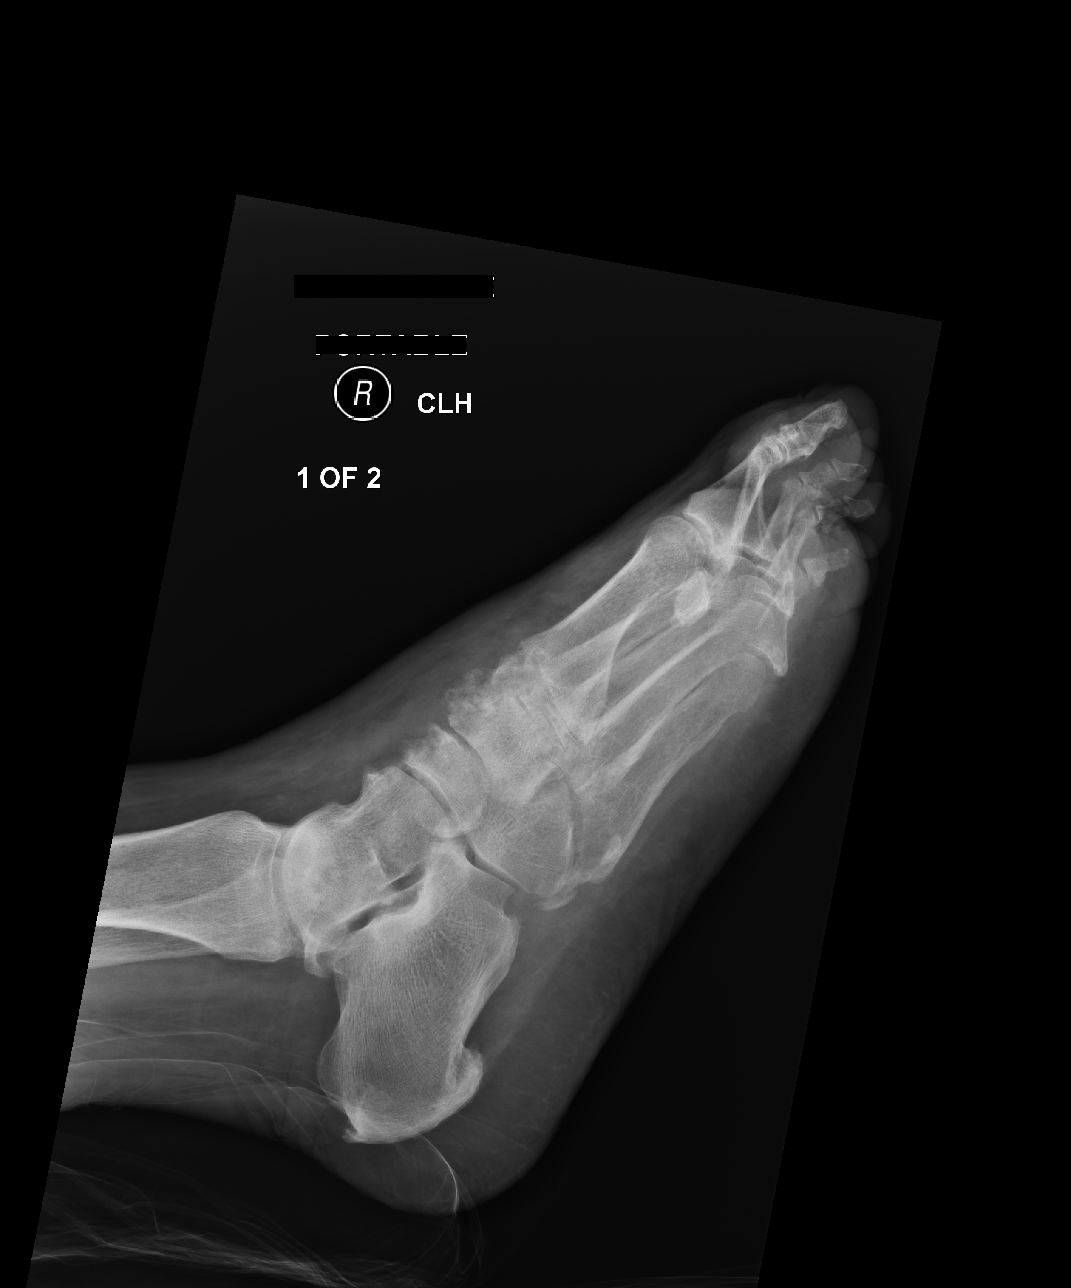

[lateral (2 of 2)]
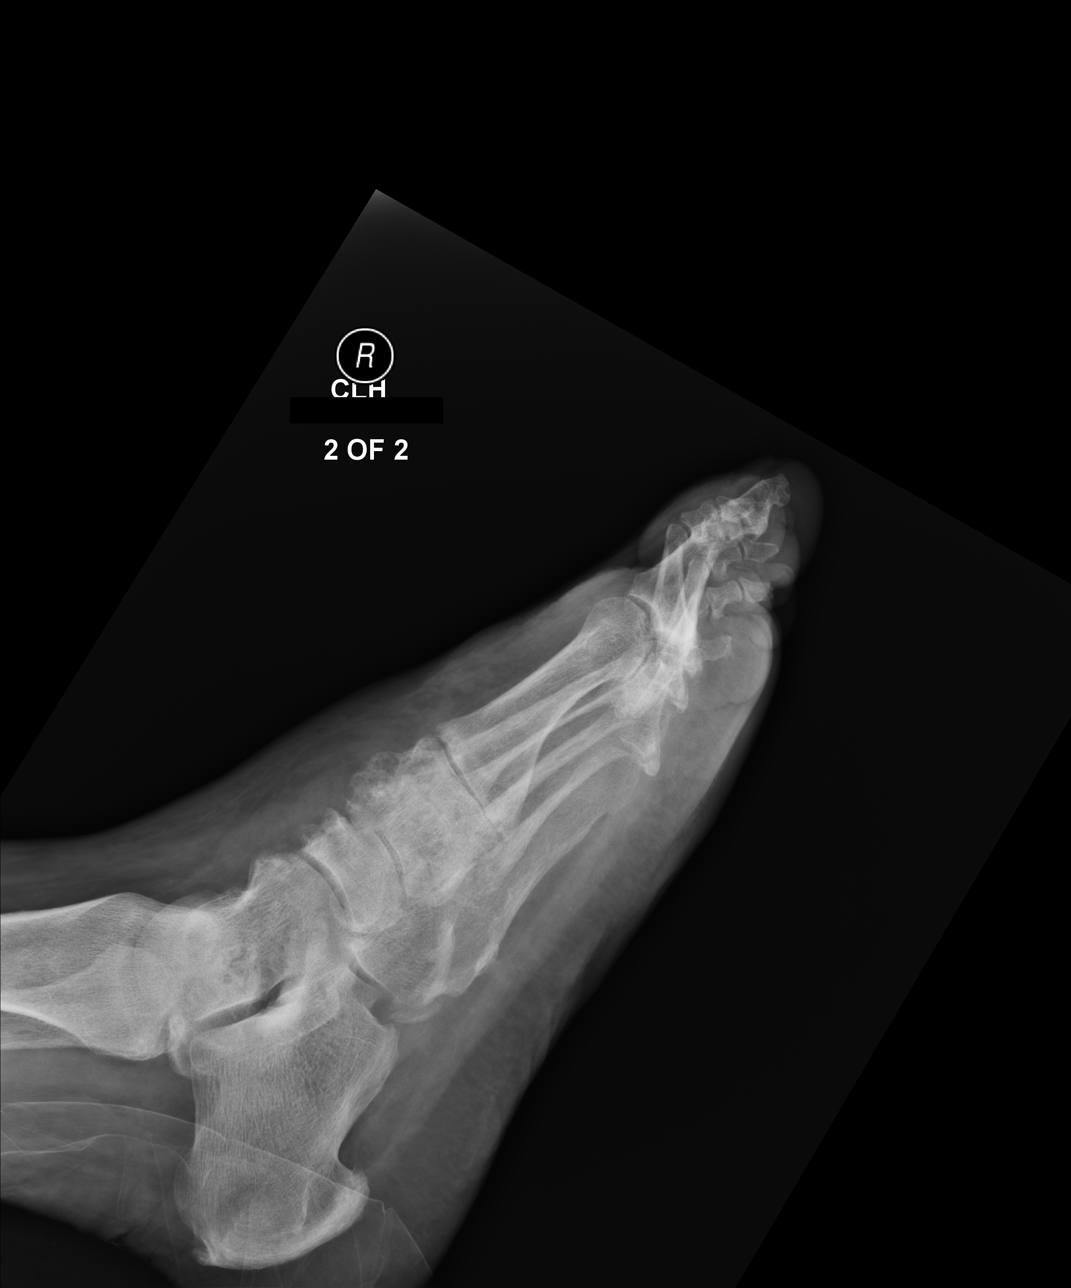

[3 of 3 positions shown; findings below may reference images not displayed]

FINDINGS: No acute bony or joint abnormality is identified. There is joint
space narrowing and marginal erosions with overhanging edges at the
IP joint of the great toe and first MTP joint. Joint space narrowing
and erosions are also present about the tarsometatarsal joints,
worst at the second and third tarsometatarsal joints. No fracture or
dislocation. Soft tissue swelling is present.
IMPRESSION: No acute bony abnormality.

Diffuse soft tissue swelling.

Abnormal appearance of the first MTP joint, IP joint of the toe and
tarsometatarsal joints highly suggestive of gouty arthropathy.

## 2019-03-03 ENCOUNTER — Telehealth: Payer: Self-pay

## 2019-03-03 NOTE — Telephone Encounter (Signed)
JUST REFERRAL ON FILE, NO NOTES SENT REFERRAL TO SCHEDULING

## 2019-07-25 ENCOUNTER — Encounter: Payer: Self-pay | Admitting: Family Medicine

## 2020-02-07 ENCOUNTER — Other Ambulatory Visit: Payer: Self-pay | Admitting: Family

## 2020-02-07 DIAGNOSIS — I872 Venous insufficiency (chronic) (peripheral): Secondary | ICD-10-CM

## 2020-03-01 ENCOUNTER — Emergency Department (HOSPITAL_COMMUNITY)
Admission: EM | Admit: 2020-03-01 | Discharge: 2020-03-01 | Disposition: A | Discharge status: Home / Self Care | Payer: Medicare Other | Attending: Emergency Medicine | Admitting: Emergency Medicine

## 2020-03-01 ENCOUNTER — Encounter (HOSPITAL_COMMUNITY): Payer: Self-pay | Admitting: Emergency Medicine

## 2020-03-01 DIAGNOSIS — Z992 Dependence on renal dialysis: Secondary | ICD-10-CM | POA: Insufficient documentation

## 2020-03-01 DIAGNOSIS — I132 Hypertensive heart and chronic kidney disease with heart failure and with stage 5 chronic kidney disease, or end stage renal disease: Secondary | ICD-10-CM | POA: Insufficient documentation

## 2020-03-01 DIAGNOSIS — I5032 Chronic diastolic (congestive) heart failure: Secondary | ICD-10-CM | POA: Insufficient documentation

## 2020-03-01 DIAGNOSIS — I953 Hypotension of hemodialysis: Secondary | ICD-10-CM | POA: Diagnosis not present

## 2020-03-01 DIAGNOSIS — Z79899 Other long term (current) drug therapy: Secondary | ICD-10-CM | POA: Insufficient documentation

## 2020-03-01 DIAGNOSIS — Z5320 Procedure and treatment not carried out because of patient's decision for unspecified reasons: Secondary | ICD-10-CM | POA: Insufficient documentation

## 2020-03-01 DIAGNOSIS — N186 End stage renal disease: Secondary | ICD-10-CM | POA: Diagnosis not present

## 2020-03-01 DIAGNOSIS — R42 Dizziness and giddiness: Secondary | ICD-10-CM | POA: Diagnosis present

## 2020-03-01 LAB — CBC
HCT: 32.2 % — ABNORMAL LOW (ref 39.0–52.0)
Hemoglobin: 10.5 g/dL — ABNORMAL LOW (ref 13.0–17.0)
MCH: 30.1 pg (ref 26.0–34.0)
MCHC: 32.6 g/dL (ref 30.0–36.0)
MCV: 92.3 fL (ref 80.0–100.0)
Platelets: 240 10*3/uL (ref 150–400)
RBC: 3.49 MIL/uL — ABNORMAL LOW (ref 4.22–5.81)
RDW: 13.4 % (ref 11.5–15.5)
WBC: 14.1 10*3/uL — ABNORMAL HIGH (ref 4.0–10.5)
nRBC: 0 % (ref 0.0–0.2)

## 2020-03-01 LAB — BASIC METABOLIC PANEL
Anion gap: 12 (ref 5–15)
BUN: 16 mg/dL (ref 6–20)
CO2: 28 mmol/L (ref 22–32)
Calcium: 8.7 mg/dL — ABNORMAL LOW (ref 8.9–10.3)
Chloride: 98 mmol/L (ref 98–111)
Creatinine, Ser: 7.01 mg/dL — ABNORMAL HIGH (ref 0.61–1.24)
GFR calc Af Amer: 9 mL/min — ABNORMAL LOW (ref >60)
GFR calc non Af Amer: 8 mL/min — ABNORMAL LOW (ref >60)
Glucose, Bld: 111 mg/dL — ABNORMAL HIGH (ref 70–99)
Potassium: 4.5 mmol/L (ref 3.5–5.1)
Sodium: 138 mmol/L (ref 135–145)

## 2020-03-01 MED ORDER — SODIUM CHLORIDE 0.9% FLUSH: 3.0000 mL | Freq: Once | INTRAVENOUS | Status: DC

## 2020-03-01 NOTE — ED Triage Notes (Signed)
Pt transported from home by [facility], pt returned home from HD, near 4L taken off today. Pt states he felt too weak to walk in to his 2nd story apartment.  Manual pressure 60/40 by EMS, 1L NS given. Pt A & O

## 2020-03-01 NOTE — ED Provider Notes (Signed)
Noah Garner   CSN: [id number] Arrival date & time: 03/01/20  1658     History Chief Complaint  Patient presents with  . Hypotension    Noah Garner is a 59 y.o. male w PMHx CHF, ESRD on HD MWF, anemia, presenting to the ED from home with lightheadedness and hypotension. Pt had just returned home from HD. He states they normally do not let him leave from hemodialysis until his blood pressure is at least 76-160 systolic.  His blood pressure was appropriate after hemodialysis after they took off 4L, which is his max to get taken off. He had no increased LE swelling or SOB prior to dialysis.  He drove home.  He states sometimes when he goes to get up to walk into his house he feels lightheaded and needs to sit there for some time.  He states this is when his blood pressure can be low.  He had a similar feeling today.  He sat there for a little bit then when he went to get up he felt very lightheaded and had a drop down to 1 knee to avoid falling.  He states he was able to get himself back into his car and sit there for a few moments longer however his symptoms persisted therefore he called EMS.  EMS noted hypotension and with blood pressure of 60/40.  He was given 1 L of normal saline. He states he feels just fine now on evaluation. States he normally needs to "sleep it off" and will be back to normal.  Denies CP, SOB, or other complaints. He normally has some tachycardia. His blood pressure has been low chronically for some time, around 737 systolic.   The history is provided by the patient.       Past Medical History:  Diagnosis Date  . Anemia   . Arthritis   . CHF (congestive heart failure) ([place])   . Chronic kidney disease    stage 4  . Chronic systolic heart failure ([place]) 08/29/2015  . Hypertension   . Morbid obesity ([place])   . Rheumatoid arthritis ([place])   . Sleep apnea    wears CPAP    Patient Active Problem List   Diagnosis  Date Noted  . Bilateral edema of lower extremity 07/05/2017  . Tremor of both hands 06/29/2017  . Chronic gout due to renal impairment of multiple sites without tophus 05/30/2017  . ESRD on hemodialysis ([place]) 05/30/2017  . Chronic renal insufficiency, stage 4 (severe) (H[facility])   . PVD (peripheral vascular disease) ([place])   . OSA (obstructive sleep apnea)   . Dependence on renal dialysis ([place])   . Acute blood loss anemia   . Leukocytosis 05/12/2017  . ESRD (end stage renal disease) ([place]) 05/12/2017  . Cellulitis 05/12/2017  . Wound infection 05/11/2017  . Anemia of chronic disease 04/09/2016  . Edema 09/16/2015  . Anemia 09/16/2015  . Chronic combined systolic and diastolic heart failure ([place]) 08/29/2015  . Chronic diastolic heart failure ([place])   . Rheumatoid arthritis ([place])   . Morbid obesity with alveolar hypoventilation ([place]) 08/15/2015  . NICM (nonischemic cardiomyopathy) ([place]) 08/15/2015  . Acute respiratory failure ([place])   . Acute on chronic combined systolic and diastolic CHF (congestive heart failure) ([place]) 08/11/2015  . Hypertension 08/11/2015  . Arthritis 08/11/2015  . Stasis ulcer ([place]) 08/11/2015  . Acute back pain   . Weakness of both legs   . Ganglion cyst of wrist 07/16/2015  .  Inflammation of multiple joints 07/16/2015  . Congestive cardiomyopathy ([place]) 03/12/2015    Past Surgical History:  Procedure Laterality Date  . AV FISTULA PLACEMENT Left 07/09/2016   Procedure: CREATION OF LEFT RADIOCEPHALIC ARTERIOVENOUS (AV) FISTULA;  Surgeon: Waynetta Sandy, MD;  Location: [facility];  Service: Vascular;  Laterality: Left;  . CARDIAC CATHETERIZATION     12/18/11: No sign CAD, elevated left heart pressures ([facility])  . KNEE SURGERY     torn ligaments       Family History  Problem Relation Age of Onset  . Hypertension Mother     Social History   Tobacco Use  . Smoking status: Former Smoker    Quit date: 08/12/1995    Years since quitting: 24.5  .  Smokeless tobacco: Never Used  Substance Use Topics  . Alcohol use: No    Alcohol/week: 0.0 standard drinks  . Drug use: No    Home Medications Prior to Admission medications   Medication Sig Start Date End Date Taking? Authorizing Provider  allopurinol (ZYLOPRIM) 300 MG tablet Take 300 mg by mouth daily.   Yes [provider]  atorvastatin (LIPITOR) 20 MG tablet Take 20 mg by mouth daily.   Yes [provider]  B Complex-C-Folic Acid (RENAL VITAMIN) 0.8 MG TABS Take 1 tablet by mouth daily.   Yes [provider]  cilostazol (PLETAL) 50 MG tablet Take 50 mg by mouth 2 (two) times daily.   Yes [provider]  cinacalcet (SENSIPAR) 60 MG tablet Take 60 mg by mouth daily after supper.  02/09/20  Yes [provider]  midodrine (PROAMATINE) 5 MG tablet Take 5 mg by mouth every Monday, Wednesday, and Friday. befo disl   Yes [provider]  ferric citrate (AURYXIA) 1 GM 210 MG(Fe) tablet Take 420 mg by mouth. After meals Starting 07/01/18 07/01/18   [provider]  polyethylene glycol (MIRALAX / GLYCOLAX) packet Take 17 g by mouth 2 (two) times daily. Patient not taking: Reported on 03/01/2020 05/25/17   Elodia Florence., MD  predniSONE (DELTASONE) 5 MG tablet Take 5 mg by mouth daily.  06/21/15   [provider]  rOPINIRole (REQUIP) 0.5 MG tablet Take 0.5 mg by mouth at bedtime. Patient not taking: Reported on 03/01/2020    [provider]  senna (SENOKOT) 8.6 MG TABS tablet Take 1 tablet (8.6 mg total) by mouth at bedtime. 05/25/17   Elodia Florence., MD  sucroferric oxyhydroxide [facility]) 500 MG chewable tablet Chew 500 mg by mouth 3 (three) times daily with meals.    [provider]  traMADol (ULTRAM) 50 MG tablet Take 1 tablet (50 mg total) by mouth 2 (two) times daily. Patient not taking: Reported on 03/01/2020 07/05/17   Gerlene Fee, NP  UNABLE TO FIND FLUID RESTRICTION - 1200 cc daily,  720cc dietary, 480cc nurse every shift.  Document amount given from nurse only should not esceed 480cc total per day. Patient not taking: Reported on 03/01/2020    [provider]    Allergies    Iodine  Review of Systems   Review of Systems  All other systems reviewed and are negative.   Physical Exam Updated Vital Signs BP (!) 86/55   Pulse (!) 117   Temp 98 F (36.7 C) (Tympanic)   Resp 17   Ht 5\' 11"  (1.803 m)   Wt (!) 158 kg   SpO2 99%   BMI 48.58 kg/m   Physical  Exam Vitals and nursing Garner reviewed.  Constitutional:      Appearance: He is well-developed.  HENT:     Head: Normocephalic and atraumatic.  Eyes:     Conjunctiva/sclera: Conjunctivae normal.  Cardiovascular:     Rate and Rhythm: Regular rhythm. Tachycardia present.  Pulmonary:     Effort: Pulmonary effort is normal.     Breath sounds: Normal breath sounds.  Abdominal:     Palpations: Abdomen is soft.  Musculoskeletal:     Right lower leg: No edema.     Left lower leg: No edema.  Skin:    General: Skin is warm.  Neurological:     Mental Status: He is alert. Mental status is at baseline.  Psychiatric:        Behavior: Behavior normal.     ED Results / Procedures / Treatments   Labs (all labs ordered are listed, but only abnormal results are displayed) Labs Reviewed  BASIC METABOLIC PANEL - Abnormal; Notable for the following components:      Result Value   Glucose, Bld 111 (*)    Creatinine, Ser 7.01 (*)    Calcium 8.7 (*)    GFR calc non Af Amer 8 (*)    GFR calc Af Amer 9 (*)    All other components within normal limits  CBC - Abnormal; Notable for the following components:   WBC 14.1 (*)    RBC 3.49 (*)    Hemoglobin 10.5 (*)    HCT 32.2 (*)    All other components within normal limits  URINALYSIS, ROUTINE W REFLEX MICROSCOPIC  CBG MONITORING, ED    EKG EKG Interpretation  Date/Time:  Friday March 01 2020 17:10:49 EDT Ventricular Rate:  121 PR Interval:    QRS  Duration: 82 QT Interval:  290 QTC Calculation: 412 R Axis:   -28 Text Interpretation: Sinus tachycardia Probable left atrial enlargement Inferior infarct, old Anterior infarct, old Lateral leads are also involved rate is faster compared to 2018 Confirmed by Sherwood Gambler [phone]) on 03/01/2020 6:35:42 PM   Radiology No results found.  Procedures Procedures (including critical care time)  Medications Ordered in ED Medications  sodium chloride flush (NS) 0.9 % injection 3 mL (has no administration in time range)    ED Course  I have reviewed the triage vital signs and the nursing notes.  Pertinent labs & imaging results that were available during my care of the patient were reviewed by me and considered in my medical decision making (see chart for details).  Clinical Course as of Mar 02 1847  Fri Mar 01, 2020  1840 Nursing reports pt states he feels fine and is ready to leave. Disucssed with patient that his BP is still low and he is still somewhat tachycardic, recommended we stabilize further with risk of falls at home. Pt adamant about leaving.    [JR]    Clinical Course User Index [JR] Darneshia Demary, [place] N, PA-C   MDM Rules/Calculators/A&P                          Pt presenting via EMS with lightheadedness and hypotension after hemodialysis today.  Patient states this is not the first time this is occurred though it has lasted a little bit longer than usual.  No recent illnesses.  On evaluation today he feels back at his baseline though is still hypotensive in the 36O systolic with tachycardia in the around 120.  He agrees to  additional IV fluids for stabilization.  Labs with slight leukocytosis, normal potassium.  After some monitoring and a little bit of IV fluids, patient states he feels fine and is ready to be discharged.  His blood pressure still remains in the 72T systolic though he is mentating appropriately.  Discussed risks of bleeding with persistent hypotension and risk  of fall.  He is adamant about leaving.  At this point, patient will be leaving [place].  He is encouraged to return back to the emergency department should his symptoms persist or worsen.  He is agreeable.  Patient discussed with and evaluated by Dr. Regenia Skeeter.  Final Clinical Impression(s) / ED Diagnoses Final diagnoses:  Hemodialysis-associated hypotension    Rx / DC Orders ED Discharge Orders    None       Jasmeen Fritsch, [place] N, PA-C 03/01/20 1850    Sherwood Gambler, MD 03/02/20 1511

## 2020-03-01 NOTE — Discharge Instructions (Addendum)
Please don't hesitate to return to the ER if your symptoms persist or worsen.

## 2020-06-18 ENCOUNTER — Other Ambulatory Visit: Payer: Self-pay | Admitting: *Deleted

## 2020-06-18 DIAGNOSIS — I739 Peripheral vascular disease, unspecified: Secondary | ICD-10-CM

## 2020-07-11 ENCOUNTER — Other Ambulatory Visit: Payer: Self-pay

## 2020-07-11 ENCOUNTER — Ambulatory Visit (HOSPITAL_COMMUNITY)
Admission: RE | Admit: 2020-07-11 | Discharge: 2020-07-11 | Disposition: A | Discharge status: Home / Self Care | Payer: Medicare Other | Source: Ambulatory Visit | Attending: Vascular Surgery | Admitting: Vascular Surgery

## 2020-07-11 ENCOUNTER — Encounter: Payer: Medicare Other | Admitting: Vascular Surgery

## 2020-07-11 ENCOUNTER — Encounter (HOSPITAL_COMMUNITY): Payer: Self-pay

## 2020-07-11 DIAGNOSIS — I739 Peripheral vascular disease, unspecified: Secondary | ICD-10-CM

## 2020-08-05 ENCOUNTER — Other Ambulatory Visit: Payer: Self-pay | Admitting: *Deleted

## 2020-08-05 DIAGNOSIS — I739 Peripheral vascular disease, unspecified: Secondary | ICD-10-CM

## 2020-08-15 ENCOUNTER — Encounter: Payer: Medicare Other | Admitting: Vascular Surgery

## 2020-08-15 ENCOUNTER — Encounter (HOSPITAL_COMMUNITY): Payer: Medicare Other

## 2024-01-06 ENCOUNTER — Encounter (HOSPITAL_COMMUNITY): Payer: Self-pay

## 2024-01-11 ENCOUNTER — Encounter (HOSPITAL_COMMUNITY)
Admission: RE | Disposition: A | Discharge status: Home / Self Care | Payer: Self-pay | Source: Home / Self Care | Attending: Nephrology

## 2024-01-11 ENCOUNTER — Ambulatory Visit (HOSPITAL_COMMUNITY)
Admission: RE | Admit: 2024-01-11 | Discharge: 2024-01-11 | Disposition: A | Discharge status: Home / Self Care | Attending: Nephrology | Admitting: Nephrology

## 2024-01-11 ENCOUNTER — Other Ambulatory Visit: Payer: Self-pay

## 2024-01-11 DIAGNOSIS — Z87891 Personal history of nicotine dependence: Secondary | ICD-10-CM | POA: Diagnosis not present

## 2024-01-11 DIAGNOSIS — I5022 Chronic systolic (congestive) heart failure: Secondary | ICD-10-CM | POA: Diagnosis not present

## 2024-01-11 DIAGNOSIS — Y832 Surgical operation with anastomosis, bypass or graft as the cause of abnormal reaction of the patient, or of later complication, without mention of misadventure at the time of the procedure: Secondary | ICD-10-CM | POA: Diagnosis not present

## 2024-01-11 DIAGNOSIS — I132 Hypertensive heart and chronic kidney disease with heart failure and with stage 5 chronic kidney disease, or end stage renal disease: Secondary | ICD-10-CM | POA: Diagnosis not present

## 2024-01-11 DIAGNOSIS — T82858A Stenosis of vascular prosthetic devices, implants and grafts, initial encounter: Secondary | ICD-10-CM | POA: Insufficient documentation

## 2024-01-11 DIAGNOSIS — I871 Compression of vein: Secondary | ICD-10-CM | POA: Diagnosis not present

## 2024-01-11 DIAGNOSIS — N186 End stage renal disease: Secondary | ICD-10-CM | POA: Insufficient documentation

## 2024-01-11 DIAGNOSIS — D631 Anemia in chronic kidney disease: Secondary | ICD-10-CM | POA: Diagnosis not present

## 2024-01-11 DIAGNOSIS — T82510A Breakdown (mechanical) of surgically created arteriovenous fistula, initial encounter: Secondary | ICD-10-CM | POA: Diagnosis not present

## 2024-01-11 DIAGNOSIS — Z992 Dependence on renal dialysis: Secondary | ICD-10-CM | POA: Diagnosis not present

## 2024-01-11 HISTORY — PX: VENOUS ANGIOPLASTY: CATH118376

## 2024-01-11 HISTORY — PX: A/V SHUNT INTERVENTION: CATH118220

## 2024-01-11 SURGERY — A/V SHUNT INTERVENTION
Anesthesia: LOCAL

## 2024-01-11 MED ORDER — LIDOCAINE HCL (PF) 1 % IJ SOLN
INTRAMUSCULAR | Status: DC | PRN
  Administered 2024-01-11: 2 mL via INTRADERMAL

## 2024-01-11 MED ORDER — LIDOCAINE HCL (PF) 1 % IJ SOLN
INTRAMUSCULAR | Status: AC
  Filled 2024-01-11: qty 30

## 2024-01-11 MED ORDER — HEPARIN (PORCINE) IN NACL 1000-0.9 UT/500ML-% IV SOLN
INTRAVENOUS | Status: DC | PRN
  Administered 2024-01-11: 500 mL

## 2024-01-11 MED ORDER — IODIXANOL 320 MG/ML IV SOLN
INTRAVENOUS | Status: DC | PRN
  Administered 2024-01-11: 9 mL

## 2024-01-11 MED ORDER — SODIUM CHLORIDE 0.9 % IV SOLN: INTRAVENOUS | Status: DC

## 2024-01-11 SURGICAL SUPPLY — 8 items
BALLOON ATHLETIS 8X40X75 (BALLOONS) IMPLANT
CATH ANGIO 5F BER2 65CM (CATHETERS) IMPLANT
COVER DOME SNAP 22 D (MISCELLANEOUS) ×2 IMPLANT
GUIDEWIRE ANGLED .035 180CM (WIRE) IMPLANT
KIT MICROPUNCTURE NIT STIFF (SHEATH) IMPLANT
MAT PREVALON FULL STRYKER (MISCELLANEOUS) IMPLANT
SHEATH PINNACLE R/O II 7F 4CM (SHEATH) IMPLANT
SYR MEDALLION 10ML (SYRINGE) IMPLANT

## 2024-01-11 NOTE — Op Note (Signed)
 Patient presents for concerns of decreased access flows and difficult cannulation in his left radiocephalic fistula which was created in October 2017.  His last endovascular intervention was >6 months ago.    Summary:  1)      The patient had successful angioplasty (8 mm Athletis FE ~18 atm) of significant 70% stenosis in the inflow cephalic vein.  2)      Patent body of the fistula with 2 medium sized aneurysms but without focal stenosis, upper arm drainage predominantly through median veins, patent axillary and left central veins.  Patent arterial anastomosis and proximal radial artery. 3)      This left radiocephalic fistula remains amenable to future percutaneous interventions.  Description of procedure: The arm was prepped and draped in the usual sterile fashion. The left forearm radiocephalic fistula was cannulated (36644) with a 21G micropuncture needle directed in a retrograde direction in venous limb of the fistula. A guidewire was inserted and exchanged for a 7Fr sheath. Contrast [phone]) injection via the side port of the sheath was performed. The angiogram of the fistula (25956) showed a patent body of the fistula with 2 medium sized aneurysms, patent upper forearm outflow cephalic vein with upper arm drainage through patent median veins, axillary and left central veins. There was a 70% inflow cephalic vein stenosis.  The angled Glidewire was advanced and manipulated until the tip of the wire was in the proximal radial artery.  Arteriogram revealed a patent proximal radial artery and arterial anastomosis with a 70% inflow cephalic vein stenosis. An 8 x 40 [employer] angioplasty balloon was then inserted over the guidewire and positioned at the inflow cephalic vein stenosis.   Venous angioplasty (38756) was carried out to 18 ATM with FULL effacement of the waist on the balloon at the inflow cephalic vein stenosis lesion. The repeat angiogram showed 10% residual stenosis with no evidence of  extravasation or dissection, now with rapid flow through the fistula circuit.  Hemostasis: A 3-0 ethilon purse string suture was placed at the cannulation site on removal of the sheath.  Sedation: None Sedation time.  Not applicable  Contrast. 8 mL  Monitoring: Because of the patient's comorbid conditions and sedation during the procedure, continuous EKG monitoring and O2 saturation monitoring was performed throughout the procedure by the RN. There were no abnormal arrhythmias encountered.  Complications: None  Diagnoses: I87.1 Stricture of vein  N18.6 ESRD T82.858A Stricture of access  Procedure Coding:  [phone] Cannulation and angiogram of fistula, venous angioplasty (inflow cephalic vein)  J1884 Contrast  Recommendations:  1. Continue to cannulate the fistula with 15G needles.  2. Refer for problems with flows/swelling. 3. Remove the suture next treatment.   Discharge: The patient was discharged home in stable condition. The patient was given education regarding the care of the dialysis access AVF and specific instructions in case of any problems.

## 2024-01-11 NOTE — H&P (Signed)
 Noah Garner is an 63 y.o. male.   Chief Complaint: Decreased access flows HPI:  63 year old man with past medical history significant for hypertension, rheumatoid arthritis, obesity, obstructive sleep apnea, chronic systolic heart failure and end-stage renal disease on hemodialysis.  He is referred for concerns of decreasing access flows of his left radiocephalic fistula that was created in October 2017.  His last endovascular procedure was >6 months ago.  He denies any chest pain or shortness of breath and has not had any nausea, vomiting or diarrhea.  He denies any fevers or chills and drove himself to the appointment today.  Past Medical History:  Diagnosis Date   Anemia    Arthritis    CHF (congestive heart failure) (HCC)    Chronic kidney disease    stage 4   Chronic systolic heart failure (HCC) 08/29/2015   Hypertension    Morbid obesity (HCC)    Rheumatoid arthritis (HCC)    Sleep apnea    wears CPAP    Past Surgical History:  Procedure Laterality Date   AV FISTULA PLACEMENT Left 07/09/2016   Procedure: CREATION OF LEFT RADIOCEPHALIC ARTERIOVENOUS (AV) FISTULA;  Surgeon: Adine Hoof, MD;  Location: [facility] OR;  Service: Vascular;  Laterality: Left;   CARDIAC CATHETERIZATION     12/18/11: No sign CAD, elevated left heart pressures ([facility])   KNEE SURGERY     torn ligaments    Family History  Problem Relation Age of Onset   Hypertension Mother    Social History:  reports that he quit smoking about 28 years ago. He has never used smokeless tobacco. He reports that he does not drink alcohol and does not use drugs.  Allergies:  Allergies  Allergen Reactions   Iodine Other (See Comments)    Flushing (Red Skin)    Medications Prior to Admission  Medication Sig Dispense Refill   allopurinol (ZYLOPRIM) 300 MG tablet Take 300 mg by mouth daily.     atorvastatin (LIPITOR) 20 MG tablet Take 20 mg by mouth daily.     B Complex-C-Folic Acid  (RENAL VITAMIN)  0.8 MG TABS Take 1 tablet by mouth daily.     cilostazol (PLETAL) 50 MG tablet Take 50 mg by mouth 2 (two) times daily.     cinacalcet  (SENSIPAR ) 60 MG tablet Take 60 mg by mouth daily after supper.      ferric citrate (AURYXIA) 1 GM 210 MG(Fe) tablet Take 420 mg by mouth. After meals Starting 07/01/18     midodrine (PROAMATINE) 5 MG tablet Take 5 mg by mouth See admin instructions. Take 5 mg by mouth every Mon/Wed/Fri as directed before dialysis     polyethylene glycol (MIRALAX  / GLYCOLAX ) packet Take 17 g by mouth 2 (two) times daily. (Patient not taking: Reported on 03/01/2020) 14 each 0   predniSONE  (DELTASONE ) 5 MG tablet Take 5 mg by mouth daily.  (Patient not taking: Reported on 03/01/2020)     rOPINIRole  (REQUIP ) 0.5 MG tablet Take 0.5 mg by mouth at bedtime. (Patient not taking: Reported on 03/01/2020)     senna (SENOKOT) 8.6 MG TABS tablet Take 1 tablet (8.6 mg total) by mouth at bedtime. (Patient not taking: Reported on 03/01/2020) 120 each 0   sucroferric oxyhydroxide (VELPHORO) 500 MG chewable tablet Chew 500 mg by mouth 3 (three) times daily with meals.     traMADol  (ULTRAM ) 50 MG tablet Take 1 tablet (50 mg total) by mouth 2 (two) times daily. (Patient not taking: Reported on 03/01/2020)  60 tablet 0   UNABLE TO FIND FLUID RESTRICTION - 1200 cc daily, 720cc dietary, 480cc nurse every shift.  Document amount given from nurse only should not esceed 480cc total per day. (Patient not taking: Reported on 03/01/2020)      No results found for this or any previous visit (from the past 48 hours). No results found.  Review of Systems  All other systems reviewed and are negative.   Blood pressure (!) 81/54, pulse 89, temperature 98 F (36.7 C), resp. rate 12, SpO2 97%. Physical Exam Vitals and nursing note reviewed.  Constitutional:      Appearance: Normal appearance. He is obese.  HENT:     Head: Normocephalic and atraumatic.     Nose: Nose normal.     Mouth/Throat:     Mouth: Mucous  membranes are dry.     Pharynx: Oropharynx is clear.  Eyes:     Extraocular Movements: Extraocular movements intact.  Cardiovascular:     Rate and Rhythm: Normal rate and regular rhythm.     Pulses: Normal pulses.     Heart sounds: Normal heart sounds.  Musculoskeletal:        General: Normal range of motion.     Cervical back: Normal range of motion and neck supple.     Comments: Left radiocephalic fistula with 2 medium sized aneurysms in the active cannulation zone that are nonpulsatile.  Poor outflow thrill with pulsatile inflow.  Suboptimal augmentation  Neurological:     Mental Status: He is alert.      Assessment/Plan 1.  Decreased access flows: Will undertake fistulogram for evaluation of culprit lesion and offer management with angioplasty if indicated.  Procedure explained to the patient and he is willing to proceed/consent obtained without sedation. 2.  End-stage renal disease: Continue hemodialysis on MWF schedule following procedure completion today. 3.  Hypotension: Chronic, monitor with procedure today.  No sedation to be given. 4.  Anemia: Without overt blood loss, resume ESA with H&H monitoring per protocol at outpatient dialysis.  Melodie Spry, MD 01/11/2024, 12:29 PM

## 2024-01-12 ENCOUNTER — Encounter (HOSPITAL_COMMUNITY): Payer: Self-pay | Admitting: Nephrology

## 2024-09-28 ENCOUNTER — Ambulatory Visit (HOSPITAL_COMMUNITY)
Admission: RE | Admit: 2024-09-28 | Discharge: 2024-09-28 | Disposition: A | Discharge status: Home / Self Care | Attending: Vascular Surgery | Admitting: Vascular Surgery

## 2024-09-28 ENCOUNTER — Encounter (HOSPITAL_COMMUNITY): Payer: Self-pay | Admitting: Vascular Surgery

## 2024-09-28 ENCOUNTER — Other Ambulatory Visit: Payer: Self-pay

## 2024-09-28 ENCOUNTER — Encounter (HOSPITAL_COMMUNITY)
Admission: RE | Disposition: A | Discharge status: Home / Self Care | Payer: Self-pay | Source: Home / Self Care | Attending: Vascular Surgery

## 2024-09-28 DIAGNOSIS — Z87891 Personal history of nicotine dependence: Secondary | ICD-10-CM | POA: Insufficient documentation

## 2024-09-28 DIAGNOSIS — T82898A Other specified complication of vascular prosthetic devices, implants and grafts, initial encounter: Secondary | ICD-10-CM

## 2024-09-28 DIAGNOSIS — I132 Hypertensive heart and chronic kidney disease with heart failure and with stage 5 chronic kidney disease, or end stage renal disease: Secondary | ICD-10-CM | POA: Insufficient documentation

## 2024-09-28 DIAGNOSIS — Y832 Surgical operation with anastomosis, bypass or graft as the cause of abnormal reaction of the patient, or of later complication, without mention of misadventure at the time of the procedure: Secondary | ICD-10-CM | POA: Insufficient documentation

## 2024-09-28 DIAGNOSIS — T82858A Stenosis of vascular prosthetic devices, implants and grafts, initial encounter: Secondary | ICD-10-CM

## 2024-09-28 DIAGNOSIS — I5022 Chronic systolic (congestive) heart failure: Secondary | ICD-10-CM | POA: Insufficient documentation

## 2024-09-28 DIAGNOSIS — Z992 Dependence on renal dialysis: Secondary | ICD-10-CM | POA: Diagnosis not present

## 2024-09-28 DIAGNOSIS — N186 End stage renal disease: Secondary | ICD-10-CM | POA: Insufficient documentation

## 2024-09-28 DIAGNOSIS — T82510A Breakdown (mechanical) of surgically created arteriovenous fistula, initial encounter: Secondary | ICD-10-CM | POA: Diagnosis present

## 2024-09-28 HISTORY — PX: A/V FISTULAGRAM: CATH118298

## 2024-09-28 MED ORDER — HEPARIN (PORCINE) IN NACL 1000-0.9 UT/500ML-% IV SOLN
INTRAVENOUS | Status: DC | PRN
  Administered 2024-09-28: 500 mL

## 2024-09-28 MED ORDER — IODIXANOL 320 MG/ML IV SOLN
INTRAVENOUS | Status: DC | PRN
  Administered 2024-09-28: 25 mL via INTRAVENOUS

## 2024-09-28 MED ORDER — METHYLPREDNISOLONE SODIUM SUCC 125 MG IJ SOLR
INTRAMUSCULAR | Status: DC | PRN
  Administered 2024-09-28: 125 mg via INTRAVENOUS

## 2024-09-28 MED ORDER — DIPHENHYDRAMINE HCL 50 MG/ML IJ SOLN
INTRAMUSCULAR | Status: AC
  Filled 2024-09-28: qty 1

## 2024-09-28 MED ORDER — LIDOCAINE HCL (PF) 1 % IJ SOLN
INTRAMUSCULAR | Status: DC | PRN
  Administered 2024-09-28: 2 mL via SUBCUTANEOUS

## 2024-09-28 MED ORDER — METHYLPREDNISOLONE SODIUM SUCC 125 MG IJ SOLR
INTRAMUSCULAR | Status: AC
  Filled 2024-09-28: qty 2

## 2024-09-28 MED ORDER — DIPHENHYDRAMINE HCL 50 MG/ML IJ SOLN
INTRAMUSCULAR | Status: DC | PRN
  Administered 2024-09-28: 50 mg via INTRAVENOUS

## 2024-09-28 MED ORDER — LIDOCAINE HCL (PF) 1 % IJ SOLN
INTRAMUSCULAR | Status: AC
  Filled 2024-09-28: qty 30

## 2024-09-28 NOTE — Op Note (Signed)
" ° ° °  Patient name: Noah Garner MRN: [id number] DOB: 08-16-1961 Sex: male  09/28/2024 Pre-operative Diagnosis: End-stage renal disease, malfunctioning left arm AV fistula with decreased flow on dialysis Post-operative diagnosis:  Same Surgeon:  Penne BROCKS. Sheree, MD Procedure Performed: 1.  Percutaneous ultrasound-guided cannulation left arm AV fistula 2.  Left upper extremity fistulogram  Indications: 64 year old male with end-stage renal disease on dialysis via left forearm radial artery to cephalic vein AV fistula which is having decreased flows on dialysis.  Most recently he underwent balloon angioplasty of the inflow of the fistula 9 months ago.  Difficulty with the fistula has recurred.  We have discussed his options he has elected for fistulogram with possible intervention.  Findings: Inflow the fistula is patent there is possibly 20% stenosis however this was not intervened upon his there is strong palpable inflow.  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  Above the antecubitum the fistula fills the cephalic vein in the deep veins briskly.  Patient has continued difficulty with fistula he would need to be considered for conversion to upper arm AV fistula with TDC placement.   Procedure:  The patient was identified in the holding area and taken to the heart and vascular procedure room.  The patient was then placed supine on the table and prepped and draped in the usual sterile fashion.  A time out was called.  Ultrasound was used to evaluate the left arm AV fistula and the skin was anesthetized 1% lidocaine  and the fistula was cannulated with a micropuncture needle followed by wire and sheath using direct ultrasound visualization.  On ultrasound images saved to the permanent record.  We then administered 125 mg of Solu-Medrol  and 50 mg of Benadryl .  Left upper extremity fistulogram images were then obtained.  We also compressed the fistula perform  retrograde imaging to evaluate the inflow.  We elected for no intervention and the sheath was removed and the cannulation site was suture-ligated.  He tolerated the procedure without any complication.  Contrast: 25 cc     Albaraa Swingle C. Sheree, MD [facility] Office: [phone] Pager: [phone]   "

## 2024-09-28 NOTE — H&P (Signed)
 " H&P  History of Present Illness: This is a 64 y.o. male history of end-stage renal disease on dialysis via left forearm AV fistula.  He is having decreased flows on dialysis.  He is here today to discuss his options moving forward.  Past Medical History:  Diagnosis Date   Anemia    Arthritis    CHF (congestive heart failure) (HCC)    Chronic kidney disease    stage 4   Chronic systolic heart failure (HCC) 08/29/2015   Hypertension    Morbid obesity (HCC)    Rheumatoid arthritis (HCC)    Sleep apnea    wears CPAP    Past Surgical History:  Procedure Laterality Date   A/V SHUNT INTERVENTION N/A 01/11/2024   Procedure: A/V SHUNT INTERVENTION;  Surgeon: Tobie Gordy POUR, MD;  Location: [facility] INVASIVE CV LAB;  Service: Cardiovascular;  Laterality: N/A;   AV FISTULA PLACEMENT Left 07/09/2016   Procedure: CREATION OF LEFT RADIOCEPHALIC ARTERIOVENOUS (AV) FISTULA;  Surgeon: Penne Lonni [place], MD;  Location: [facility] OR;  Service: Vascular;  Laterality: Left;   CARDIAC CATHETERIZATION     12/18/11: No sign CAD, elevated left heart pressures [facility])   KNEE SURGERY     torn ligaments   VENOUS ANGIOPLASTY Left 01/11/2024   Procedure: VENOUS ANGIOPLASTY;  Surgeon: Tobie Gordy POUR, MD;  Location: [facility] INVASIVE CV LAB;  Service: Cardiovascular;  Laterality: Left;  70% Inflow Cephalic    Allergies[1]  Prior to Admission medications  Medication Sig Start Date End Date Taking? Authorizing Provider  allopurinol (ZYLOPRIM) 300 MG tablet Take 300 mg by mouth daily.    [provider]  atorvastatin (LIPITOR) 20 MG tablet Take 20 mg by mouth daily.    [provider]  B Complex-C-Folic Acid  (RENAL VITAMIN) 0.8 MG TABS Take 1 tablet by mouth daily.    [provider]  cilostazol (PLETAL) 50 MG tablet Take 50 mg by mouth 2 (two) times daily.    [provider]  cinacalcet  (SENSIPAR ) 60 MG tablet Take 60 mg by mouth daily after supper.  02/09/20   [provider]  ferric citrate (AURYXIA) 1 GM 210 MG(Fe) tablet Take 420 mg by mouth. After meals Starting 07/01/18 07/01/18   [provider]  midodrine (PROAMATINE) 5 MG tablet Take 5 mg by mouth See admin instructions. Take 5 mg by mouth every Mon/Wed/Fri as directed before dialysis    [provider]  polyethylene glycol (MIRALAX  / GLYCOLAX ) packet Take 17 g by mouth 2 (two) times daily. Patient not taking: Reported on 03/01/2020 05/25/17   Perri DELENA Meliton Mickey., MD  predniSONE  (DELTASONE ) 5 MG tablet Take 5 mg by mouth daily.  Patient not taking: Reported on 03/01/2020 06/21/15   [provider]  rOPINIRole  (REQUIP ) 0.5 MG tablet Take 0.5 mg by mouth at bedtime. Patient not taking: Reported on 03/01/2020    [provider]  senna (SENOKOT) 8.6 MG TABS tablet Take 1 tablet (8.6 mg total) by mouth at bedtime. Patient not taking: Reported on 03/01/2020 05/25/17   Perri DELENA Meliton Mickey., MD  sucroferric oxyhydroxide [facility]) 500 MG chewable tablet Chew 500 mg by mouth 3 (three) times daily with meals.    [provider]  traMADol  (ULTRAM ) 50 MG tablet Take 1 tablet (50 mg total) by mouth 2 (two) times daily. Patient not taking: Reported on 03/01/2020 07/05/17   Landy Barnie RAMAN, NP  UNABLE TO FIND FLUID RESTRICTION - 1200 cc daily, 720cc dietary, 480cc  nurse every shift.  Document amount given from nurse only should not esceed 480cc total per day. Patient not taking: Reported on 03/01/2020    [provider]    Social History   Socioeconomic History   Marital status: Single    Spouse name: Not on file   Number of children: Not on file   Years of education: Not on file   Highest education level: Not on file  Occupational History   Not on file  Tobacco Use   Smoking status: Former    Current packs/day: 0.00    Types: Cigarettes    Quit date: 08/12/1995    Years since quitting: 29.1   Smokeless tobacco: Never  Substance and Sexual Activity    Alcohol use: No    Alcohol/week: 0.0 standard drinks of alcohol   Drug use: No   Sexual activity: Not on file  Other Topics Concern   Not on file  Social History Narrative   Not on file   Social Drivers of Health   Tobacco Use: Medium Risk (01/12/2024)   Patient History    Smoking Tobacco Use: Former    Smokeless Tobacco Use: Never    Passive Exposure: Not on [place] Strain: Not on file  Food Insecurity: Not on file  Transportation Needs: Not on file  Physical Activity: Not on file  Stress: Not on file  Social Connections: Not on file  Intimate Partner Violence: Not on file  Depression (PHQ2-9): Not on file  Alcohol Screen: Not on file  Housing: Not on file  Utilities: Not on file  Health Literacy: Not on file     Family History  Problem Relation Age of Onset   Hypertension Mother     ROS: Decreased clearance on dialysis   Physical Examination  Vitals:   09/28/24 0730 09/28/24 0737  BP: 94/61 94/61  Pulse: 93 90  Resp: 12 16  Temp: 98 F (36.7 C)   SpO2: 97% 96%   There is no height or weight on file to calculate BMI.  Awake alert and oriented On the respirations Left forearm AV fistula with 2 areas of pseudoaneurysmal degeneration and pulsatility  CBC    Component Value Date/Time   WBC 14.1 (H) 03/01/2020 1714   RBC 3.49 (L) 03/01/2020 1714   HGB 10.5 (L) 03/01/2020 1714   HCT 32.2 (L) 03/01/2020 1714   PLT 240 03/01/2020 1714   MCV 92.3 03/01/2020 1714   MCH 30.1 03/01/2020 1714   MCHC 32.6 03/01/2020 1714   RDW 13.4 03/01/2020 1714   LYMPHSABS 2.3 05/19/2017 0409   MONOABS 1.5 (H) 05/19/2017 0409   EOSABS 0.2 05/19/2017 0409   BASOSABS 0.0 05/19/2017 0409    BMET    Component Value Date/Time   NA 138 03/01/2020 1714   K 4.5 03/01/2020 1714   CL 98 03/01/2020 1714   CO2 28 03/01/2020 1714   GLUCOSE 111 (H) 03/01/2020 1714   BUN 16 03/01/2020 1714   CREATININE 7.01 (H) 03/01/2020 1714   CALCIUM  8.7 (L) 03/01/2020  1714   CALCIUM  9.5 06/19/2016 1215   GFRNONAA 8 (L) 03/01/2020 1714   GFRAA 9 (L) 03/01/2020 1714    COAGS: Lab Results  Component Value Date   INR 1.58 (H) 08/14/2015     Vascular Imaging:   Previous fistulogram reviewed  ASSESSMENT/PLAN: This is a 64 y.o. male history of end-stage renal disease on dialysis via left forearm AV fistula with now with pulsatility and decreased clearance  on dialysis.  We have discussed his options and he has elected for arm AV fistulogram with possible intervention.  All questions were answered and consent was signed.  Bertrum Helmstetter C. Sheree, MD [facility] [place] Office: [phone] Pager: [phone]     [1]  Allergies Allergen Reactions   Iodine  Other (See Comments)    Flushing (Red Skin)   "
